# Patient Record
Sex: Female | Born: 1945
Health system: Southern US, Community
[De-identification: ages and names within clinical notes are randomized; demographics above are authoritative.]

## PROBLEM LIST (undated history)

## (undated) DIAGNOSIS — Z9889 Other specified postprocedural states: Secondary | ICD-10-CM

## (undated) DIAGNOSIS — M810 Age-related osteoporosis without current pathological fracture: Secondary | ICD-10-CM

## (undated) DIAGNOSIS — Z8719 Personal history of other diseases of the digestive system: Secondary | ICD-10-CM

## (undated) DIAGNOSIS — R0600 Dyspnea, unspecified: Secondary | ICD-10-CM

## (undated) DIAGNOSIS — S129XXA Fracture of neck, unspecified, initial encounter: Secondary | ICD-10-CM

## (undated) DIAGNOSIS — M8580 Other specified disorders of bone density and structure, unspecified site: Secondary | ICD-10-CM

## (undated) DIAGNOSIS — S83206A Unspecified tear of unspecified meniscus, current injury, right knee, initial encounter: Secondary | ICD-10-CM

## (undated) DIAGNOSIS — R112 Nausea with vomiting, unspecified: Secondary | ICD-10-CM

## (undated) DIAGNOSIS — Z87412 Personal history of vulvar dysplasia: Secondary | ICD-10-CM

## (undated) DIAGNOSIS — K219 Gastro-esophageal reflux disease without esophagitis: Secondary | ICD-10-CM

## (undated) DIAGNOSIS — R42 Dizziness and giddiness: Secondary | ICD-10-CM

## (undated) DIAGNOSIS — Z973 Presence of spectacles and contact lenses: Secondary | ICD-10-CM

## (undated) DIAGNOSIS — J8410 Pulmonary fibrosis, unspecified: Secondary | ICD-10-CM

## (undated) DIAGNOSIS — Z8781 Personal history of (healed) traumatic fracture: Secondary | ICD-10-CM

## (undated) DIAGNOSIS — Z86718 Personal history of other venous thrombosis and embolism: Secondary | ICD-10-CM

## (undated) DIAGNOSIS — R55 Syncope and collapse: Secondary | ICD-10-CM

## (undated) DIAGNOSIS — M25522 Pain in left elbow: Secondary | ICD-10-CM

## (undated) DIAGNOSIS — M1990 Unspecified osteoarthritis, unspecified site: Secondary | ICD-10-CM

## (undated) HISTORY — DX: Other specified disorders of bone density and structure, unspecified site: M85.80

## (undated) HISTORY — PX: APPENDECTOMY: SHX54

## (undated) HISTORY — DX: Other specified postprocedural states: R11.2

## (undated) HISTORY — PX: CARDIOVASCULAR STRESS TEST: SHX262

## (undated) HISTORY — PX: EYE SURGERY: SHX253

## (undated) HISTORY — PX: HERNIA REPAIR: SHX51

## (undated) HISTORY — DX: Other specified postprocedural states: Z98.890

## (undated) HISTORY — DX: Unspecified osteoarthritis, unspecified site: M19.90

## (undated) HISTORY — DX: Presence of spectacles and contact lenses: Z97.3

## (undated) HISTORY — PX: OTHER SURGICAL HISTORY: SHX169

---

## 1968-06-19 HISTORY — PX: TONSILLECTOMY AND ADENOIDECTOMY: SUR1326

## 1971-04-06 HISTORY — PX: OTHER SURGICAL HISTORY: SHX169

## 1972-05-15 HISTORY — PX: CERVICAL CONE BIOPSY: SUR198

## 1982-12-22 HISTORY — PX: TUBAL LIGATION: SHX77

## 1993-06-19 HISTORY — PX: GYNECOLOGIC CRYOSURGERY: SHX857

## 1996-10-21 HISTORY — PX: ABDOMINAL HYSTERECTOMY: SHX81

## 1999-06-16 HISTORY — PX: OTHER SURGICAL HISTORY: SHX169

## 2002-02-24 HISTORY — PX: NEUROPLASTY / TRANSPOSITION ULNAR NERVE AT ELBOW: SUR895

## 2007-06-26 HISTORY — PX: OTHER SURGICAL HISTORY: SHX169

## 2008-04-22 HISTORY — PX: TRANSTHORACIC ECHOCARDIOGRAM: SHX275

## 2009-01-27 HISTORY — PX: UMBILICAL HERNIA REPAIR: SHX196

## 2009-01-27 HISTORY — PX: CHOLECYSTECTOMY: SHX55

## 2010-01-28 ENCOUNTER — Encounter (INDEPENDENT_AMBULATORY_CARE_PROVIDER_SITE_OTHER): Payer: Self-pay | Admitting: *Deleted

## 2010-02-01 DIAGNOSIS — M8580 Other specified disorders of bone density and structure, unspecified site: Secondary | ICD-10-CM | POA: Insufficient documentation

## 2010-07-04 ENCOUNTER — Encounter
Admission: RE | Admit: 2010-07-04 | Discharge: 2010-07-04 | Payer: Self-pay | Source: Home / Self Care | Attending: Internal Medicine | Admitting: Internal Medicine

## 2010-07-19 NOTE — Letter (Signed)
Summary: New Patient letter  [facility]  [address] [place], [place] [address]   Phone: [phone]  Fax: [phone]       01/28/2010 MRN: [id number]  Sherry Barnes [address] [place], [place]  [address]  Dear Ms. Sherry Barnes,  Welcome to the Gastroenterology Division at [facility].    You are scheduled to see Dr.  Christella Hartigan on 03-07-10 at 3:30p.m. on the 3rd floor at [facility], [address].  We ask that you try to arrive at our office 15 minutes prior to your appointment time to allow for check-in.  We would like you to complete the enclosed self-administered evaluation form prior to your visit and bring it with you on the day of your appointment.  We will review it with you.  Also, please bring a complete list of all your medications or, if you prefer, bring the medication bottles and we will list them.  Please bring your insurance card so that we may make a copy of it.  If your insurance requires a referral to see a specialist, please bring your referral form from your primary care physician.  Co-payments are due at the time of your visit and may be paid by cash, check or credit card.     Your office visit will consist of a consult with your physician (includes a physical exam), any laboratory testing he/she may order, scheduling of any necessary diagnostic testing (e.g. x-ray, ultrasound, CT-scan), and scheduling of a procedure (e.g. Endoscopy, Colonoscopy) if required.  Please allow enough time on your schedule to allow for any/all of these possibilities.    If you cannot keep your appointment, please call [phone] to cancel or reschedule prior to your appointment date.  This allows [place] the opportunity to schedule an appointment for another patient in need of care.  If you do not cancel or reschedule by 5 p.m. the business day prior to your appointment date, you will be charged a $50.00 late cancellation/no-show fee.    Thank you for choosing  [facility] for your medical needs.  We appreciate the opportunity to care for you.  Please visit [place] at our website  to learn more about our practice.                     Sincerely,                                                             The Gastroenterology Division

## 2011-03-15 ENCOUNTER — Encounter (INDEPENDENT_AMBULATORY_CARE_PROVIDER_SITE_OTHER): Payer: Self-pay | Admitting: General Surgery

## 2011-03-16 ENCOUNTER — Other Ambulatory Visit (INDEPENDENT_AMBULATORY_CARE_PROVIDER_SITE_OTHER): Payer: Self-pay | Admitting: General Surgery

## 2011-03-16 ENCOUNTER — Encounter (INDEPENDENT_AMBULATORY_CARE_PROVIDER_SITE_OTHER): Payer: Self-pay | Admitting: General Surgery

## 2011-03-16 ENCOUNTER — Ambulatory Visit (INDEPENDENT_AMBULATORY_CARE_PROVIDER_SITE_OTHER): Payer: PRIVATE HEALTH INSURANCE | Admitting: General Surgery

## 2011-03-16 VITALS — BP 122/84 | HR 72 | Temp 97.1°F | Resp 16 | Ht 63.0 in | Wt 201.8 lb

## 2011-03-16 DIAGNOSIS — K439 Ventral hernia without obstruction or gangrene: Secondary | ICD-10-CM

## 2011-03-16 NOTE — Progress Notes (Addendum)
Chief Complaint  Patient presents with  . Other    Eval ventral hernia with pain    HPI Sherry Barnes is a 65 y.o. female.    This is a 65 year old Caucasian female, referred to me Dr. Merri Brunette for evaluation of a ventral hernia.  The patient has had several operations in the past. At age 61 she had removal of bilateral  ovarian dermoids and appendectomy through a Pfannenstiel incision. In 1984 she underwent laparoscopic bilateral tubal ligation. In 1998 she underwent hysterectomy and bladder repair. This may have been done vaginally or it may have been done through the Pfannenstiel incision.  On January 27, 2009 she underwent laparoscopic cholecystectomy and a repaired umbilical hernia with primary Prolene sutures, no mesh. Done in [place] , [place]. She moved to [place]  one year ago. She states that for one year she's had a painful bulge in her mid lower abdomen. She sometimes used gurgling in the area. It is painful when she tries to do lifting. She has not had any obstruction or incarceration.  She reported this to Dr. Carolee Rota office and they have referred her to me. She has not had any imaging studies or further evaluation.  Her only significant medical problems are obesity, history of DVT is currently not on Coumadin, possible sleep apnea, recent urinary tract infection, surgeries as described above.HPI  Past Medical History  Diagnosis Date  . Abdominal pain   . History of TIAs   . Osteoarthritis   . DVT of leg (deep venous thrombosis)     left  . History of UTI   . History of syncope     as a child only  . Osteopenia   . Lichen simplex     on scalp  . Hyperlipidemia     slight  .    Marland Kitchen Rectal bleeding     seldom  . Incontinence   . History of blood clots   . Wears glasses   . Cancer     vulvar carcinoma in situ, resulting in vulvar excision    Past Surgical History  Procedure Date  . Tonsilectomy, s 56  . Appendectomy   . Bilateral ovarian dermoid  cystectomy 04/06/1971  . Bladder repair     due to incontinence  . Condylectomy 06/16/1999    left foot, fifth toe  . Gynecologic cryosurgery 1995    for cervical dysplasia  . Transposition of ulnar nerve 02/24/2002    left elbow  .        Marland Kitchen Cervical cone biopsy 05/15/1972  . Tubal ligation 12/22/1982    bilateral  . Abdominal hysterectomy 10/21/1996  . Colposcopy vulva 06/26/2007    wide local excision of left vulva  . Hernia repair 01/27/2009    ventral umbilical   . Cholecystectomy 01/27/09    Family History  Problem Relation Age of Onset  . Irritable bowel syndrome Mother CAD (Mother)   . Other Mother     bladder infections, gallstones, low thyroid  . Heart disease Father     congestive heart failure -  . Emphysema Father   .     Marland Kitchen Other Sister     gallstones  . Other Brother     reflux, mitral valve prolapse    Social History History   Social History  . Marital Status: Married    Spouse Name: N/A    Number of Children: N/A  . Years of Education: N/A   Occupational History  . Not  on file.   Social History Main Topics  . Smoking status: Former Smoker    Quit date: 02/17/1974  . Smokeless tobacco: Never Used  . Alcohol Use: No  . Drug Use: No  . Sexually Active: Not on file   Other Topics Concern  . Not on file   Social History Narrative  . No narrative on file     No Known Allergies  Current Outpatient Prescriptions  Medication Sig Dispense Refill  . vitamin B-12 (CYANOCOBALAMIN) 100 MCG tablet Take 50 mcg by mouth daily.        . ARTIFICIAL TEAR OP Apply to eye.        . cholecalciferol (VITAMIN D) 1000 UNITS tablet Take 2,000 Units by mouth daily.       .          Review of Systems Review of Systems 12 system review of systems is performed and is negative except as described above. Blood pressure 122/84, pulse 72, temperature 97.1 F (36.2 C), temperature source Temporal, resp. rate 16, height 5\' 3"  (1.6 m), weight 201 lb 12.8 oz (91.536  kg).  Physical Exam Physical Exam  Constitutional: She is oriented to person, place, and time. She appears well-developed and well-nourished. No distress.  HENT:  Head: Normocephalic.  Nose: Nose normal.  Mouth/Throat: No oropharyngeal exudate.  Eyes: Conjunctivae and EOM are normal. Right eye exhibits no discharge. Left eye exhibits no discharge. No scleral icterus.  Neck: Normal range of motion. Neck supple. No JVD present. No tracheal deviation present. No thyromegaly present.  Cardiovascular: Normal rate, regular rhythm, normal heart sounds and intact distal pulses.   No murmur heard. Pulmonary/Chest: Effort normal and breath sounds normal. No respiratory distress. She has no wheezes. She has no rales. She exhibits no tenderness.  Abdominal: Soft. Bowel sounds are normal. She exhibits no distension and no mass. There is tenderness. There is no rebound and no guarding.    Musculoskeletal: Normal range of motion. She exhibits no edema and no tenderness.  Lymphadenopathy:    She has no cervical adenopathy.  Neurological: She is alert and oriented to person, place, and time. She exhibits normal muscle tone. Coordination normal.  Skin: Skin is warm and dry. No rash noted. She is not diaphoretic. No erythema. No pallor.  Psychiatric: She has a normal mood and affect. Her behavior is normal. Judgment and thought content normal.    Data Reviewed I have reviewed Dr. Carolee Rota office records, the operative note from [facility] 2010, and her list of surgeries.  Assessment    Suspect ventral incisional hernia, mid and lower abdomen, possibly incarcerated.status post laparoscopic cholecystectomy and primary or periumbilical hernia 2010.  Status post laparoscopic tubal ligation.  Status post hysterectomy and appendectomy.  History deep venous thrombosis left leg, now off Coumadin.  To be evaluated for sleep apnea.    Plan    Scheduled for CT scan of abdomen and  pelvis.  Return to see me in 2-3 weeks. If the CT scan confirms a ventral hernia then we will discuss options for surgical intervention.    ADDENDUM: (04/03/2011)  CT shows ventral hernia containing small bowel, but no obstruction.  I called patient and discussed findings with her and told her that she would benefit from repair with mesh. She is doing fine at home. She will see me in office Oct. 29.   Edker Punt M 03/16/2011, 12:08 PM

## 2011-03-16 NOTE — Patient Instructions (Signed)
I suspect that you have a ventral hernia in your mid and lower abdomen. You will be scheduled for a CT scan to better define the extent of the muscle defect. You  will return to see me in 3 weeks after this is done for further discussion and possible planning for surgery.

## 2011-03-21 ENCOUNTER — Ambulatory Visit
Admission: RE | Admit: 2011-03-21 | Discharge: 2011-03-21 | Disposition: A | Discharge status: Home / Self Care | Payer: Managed Care, Other (non HMO) | Source: Ambulatory Visit | Attending: General Surgery | Admitting: General Surgery

## 2011-03-21 DIAGNOSIS — K439 Ventral hernia without obstruction or gangrene: Secondary | ICD-10-CM

## 2011-03-21 MED ORDER — IOHEXOL 300 MG/ML  SOLN
125.0000 mL | Freq: Once | INTRAMUSCULAR | Status: AC | PRN
  Administered 2011-03-21: 125 mL via INTRAVENOUS

## 2011-04-17 ENCOUNTER — Ambulatory Visit (INDEPENDENT_AMBULATORY_CARE_PROVIDER_SITE_OTHER): Payer: Managed Care, Other (non HMO) | Admitting: General Surgery

## 2011-04-17 ENCOUNTER — Encounter (INDEPENDENT_AMBULATORY_CARE_PROVIDER_SITE_OTHER): Payer: Self-pay | Admitting: General Surgery

## 2011-04-17 DIAGNOSIS — K436 Other and unspecified ventral hernia with obstruction, without gangrene: Secondary | ICD-10-CM | POA: Insufficient documentation

## 2011-04-17 NOTE — Patient Instructions (Signed)
You will be scheduled for surgery. The surgery will repair your ventral hernia with mesh. We might be able to do all or part of this operation laparoscopically, but it is more likely that we'll have to do this through an open incision because of the size of the defect.

## 2011-04-17 NOTE — Progress Notes (Signed)
Chief Complaint  Patient presents with  . Routine Post Op    F/u abd CT discuss hernia sx    HPI Sherry Barnes is a 65 y.o. female.   HPI  This pleasant woman returns to discuss management of her ventral incisional hernia following her CT scan.  The CT scan shows a defect approximately 6.5 cm in diameter containing loops of small bowel. There is no obstruction. There are no other pathologic findings.  She brought anoperative dictation from the pelvic surgery that she had in 1998 in [facility]. She had a total abdominal hysterectomy, bilateral salpingo-oophorectomy, bladder suspension. It appears that they opened her bladder, removed some sutures, and performed a suprapubic cystostomy. A cul-de-sac plication was done.  I discussed the CT scan findings with her in detail. Her husband was with her throughout the encounter today.  Past Medical History  Diagnosis Date  . Abdominal pain   . History of TIAs   . Osteoarthritis   . DVT of leg (deep venous thrombosis)     left  . History of UTI   . History of syncope     as a child only  . Osteopenia   . Lichen simplex     on scalp  . Hyperlipidemia     slight  . Hiatal hernia   . Rectal bleeding     seldom  . Incontinence   . History of blood clots   . Wears glasses   . Cancer     vulvar carcinoma in situ, resulting in vulvar excision  . Ventral hernia     Past Surgical History  Procedure Date  . Tonsilectomy, adenoidectomy, bilateral myringotomy and tubes 1970  . Appendectomy   . Bilateral ovarian dermoid cystectomy 04/06/1971  . Bladder repair     due to incontinence  . Condylectomy 06/16/1999    left foot, fifth toe  . Gynecologic cryosurgery 1995    for cervical dysplasia  . Transposition of ulnar nerve 02/24/2002    left elbow  . Carpel tunnel     left  . Cervical cone biopsy 05/15/1972  . Tubal ligation 12/22/1982    bilateral  . Abdominal hysterectomy 10/21/1996  . Colposcopy vulva 06/26/2007    wide local excision of left vulva  . Hernia repair 01/27/2009    ventral umbilical   . Cholecystectomy 01/27/09    Family History  Problem Relation Age of Onset  . Irritable bowel syndrome Mother   . Other Mother     bladder infections, gallstones, low thyroid  . Heart disease Father     congestive heart failure - 3 blocked arteries  . Emphysema Father   . Parkinsonism Father   . Other Sister     gallstones  . Other Brother     reflux, mitral valve prolapse    Social History History  Substance Use Topics  . Smoking status: Former Smoker    Quit date: 03/15/1973  . Smokeless tobacco: Never Used  . Alcohol Use: No    No Known Allergies  Current Outpatient Prescriptions  Medication Sig Dispense Refill  . Calcium Carbonate-Vitamin D (CALCIUM + D PO) Take by mouth daily.        . cholecalciferol (VITAMIN D) 1000 UNITS tablet Take 2,000 Units by mouth daily.       . vitamin B-12 (CYANOCOBALAMIN) 100 MCG tablet Take 50 mcg by mouth daily.          Review of Systems Review of Systems  Constitutional: Negative for fever, chills and unexpected weight change.  HENT: Negative for hearing loss, congestion, sore throat, trouble swallowing and voice change.   Eyes: Negative for visual disturbance.  Respiratory: Negative for cough and wheezing.   Cardiovascular: Negative for chest pain, palpitations and leg swelling.  Gastrointestinal: Positive for abdominal pain and abdominal distention. Negative for nausea, vomiting, diarrhea, constipation, blood in stool and anal bleeding.  Genitourinary: Negative for hematuria, vaginal bleeding and difficulty urinating.  Musculoskeletal: Negative for arthralgias.  Skin: Negative for rash and wound.  Neurological: Negative for seizures, syncope and headaches.  Hematological: Negative for adenopathy. Does not bruise/bleed easily.  Psychiatric/Behavioral: Negative for confusion.    Blood pressure 125/82, pulse 64, temperature 97.9 F (36.6 C),  temperature source Temporal, resp. rate 20, height 5\' 3"  (1.6 m), weight 199 lb 8 oz (90.493 kg).  Physical Exam Physical Exam  Constitutional: She is oriented to person, place, and time. She appears well-developed and well-nourished. No distress.  Cardiovascular: Regular rhythm, normal heart sounds and intact distal pulses.   No murmur heard. Pulmonary/Chest: Effort normal and breath sounds normal. No respiratory distress. She has no wheezes. She has no rales. She exhibits no tenderness.  Abdominal: Soft. Bowel sounds are normal. She exhibits mass. She exhibits no distension. There is no tenderness. There is no rebound and no guarding.    Neurological: She is alert and oriented to person, place, and time. She exhibits normal muscle tone. Coordination normal.  Skin: Skin is warm and dry. No rash noted. She is not diaphoretic. No erythema. No pallor.  Psychiatric: She has a normal mood and affect. Her behavior is normal. Judgment and thought content normal.    Data Reviewed  I reviewed the CT CT scan report and a CT scan films. I reviewed the operative note from 1998.  Assessment    Incarcerated ventral incisional hernia. This might reduce under general anesthesia but it might not. The defect aof6.5 cm may be too large to consider laparoscopic bridging, and we may find that she would be better off with a primary repair and component separation technique.  Obesity. This will increase the risk of wound complications and recurrence.    Plan    Schedule for repair of her ventral incisional hernia which is partially incarcerated. We will set this up to start out as a laparoscopic approach. We may find that we simply can get the adhesions down laparoscopically and then have to convert to an open operation to do a proper repair. I told her and her husband that bridging a large defect is associated with unacceptable recurrence rates, but that open repair is associated with more pain and slightly  more wound complications and increased length of stay in the hospital. Avoiding any recurrences most important in this setting, however.  I discussed the indications and details of surgery with them both. Risks and complications have been outlined, including but limited to bleeding, infection, and recurrence of the hernia, injury to adjacent organs as the bladder or intestine with major reconstructive surgery, wound healing problems, cardiac, pulmonary and thromboembolic problems. She understands the issues well.  her questions were answered. She is in full  Agreement with this plan.       Shantia Sanford M 04/17/2011, 1:01 PM

## 2011-04-24 ENCOUNTER — Encounter (HOSPITAL_COMMUNITY): Payer: Self-pay | Admitting: Pharmacy Technician

## 2011-04-26 ENCOUNTER — Encounter (HOSPITAL_COMMUNITY): Payer: Self-pay

## 2011-04-26 ENCOUNTER — Ambulatory Visit (HOSPITAL_COMMUNITY)
Admission: RE | Admit: 2011-04-26 | Discharge: 2011-04-26 | Disposition: A | Discharge status: Home / Self Care | Payer: Managed Care, Other (non HMO) | Source: Ambulatory Visit | Attending: General Surgery | Admitting: General Surgery

## 2011-04-26 ENCOUNTER — Encounter (HOSPITAL_COMMUNITY): Payer: Managed Care, Other (non HMO)

## 2011-04-26 DIAGNOSIS — K439 Ventral hernia without obstruction or gangrene: Secondary | ICD-10-CM | POA: Insufficient documentation

## 2011-04-26 DIAGNOSIS — Z01818 Encounter for other preprocedural examination: Secondary | ICD-10-CM | POA: Insufficient documentation

## 2011-04-26 LAB — URINALYSIS, ROUTINE W REFLEX MICROSCOPIC
Bilirubin Urine: NEGATIVE
Glucose, UA: NEGATIVE mg/dL
Hgb urine dipstick: NEGATIVE
Ketones, ur: NEGATIVE mg/dL
Leukocytes, UA: NEGATIVE
Nitrite: NEGATIVE
Protein, ur: NEGATIVE mg/dL
Specific Gravity, Urine: 1.006 (ref 1.005–1.030)
Urobilinogen, UA: 0.2 mg/dL (ref 0.0–1.0)
pH: 5.5 (ref 5.0–8.0)

## 2011-04-26 LAB — DIFFERENTIAL
Basophils Absolute: 0 10*3/uL (ref 0.0–0.1)
Basophils Relative: 0 % (ref 0–1)
Eosinophils Absolute: 0.1 10*3/uL (ref 0.0–0.7)
Eosinophils Relative: 2 % (ref 0–5)
Lymphocytes Relative: 29 % (ref 12–46)
Lymphs Abs: 2.2 10*3/uL (ref 0.7–4.0)
Monocytes Absolute: 0.4 10*3/uL (ref 0.1–1.0)
Monocytes Relative: 5 % (ref 3–12)
Neutro Abs: 4.8 10*3/uL (ref 1.7–7.7)
Neutrophils Relative %: 64 % (ref 43–77)

## 2011-04-26 LAB — CBC
HCT: 37.9 % (ref 36.0–46.0)
Hemoglobin: 12.8 g/dL (ref 12.0–15.0)
MCH: 29.5 pg (ref 26.0–34.0)
MCHC: 33.8 g/dL (ref 30.0–36.0)
MCV: 87.3 fL (ref 78.0–100.0)
Platelets: 296 10*3/uL (ref 150–400)
RBC: 4.34 MIL/uL (ref 3.87–5.11)
RDW: 13.1 % (ref 11.5–15.5)
WBC: 7.5 10*3/uL (ref 4.0–10.5)

## 2011-04-26 LAB — COMPREHENSIVE METABOLIC PANEL
ALT: 17 U/L (ref 0–35)
AST: 18 U/L (ref 0–37)
Albumin: 3.7 g/dL (ref 3.5–5.2)
Alkaline Phosphatase: 90 U/L (ref 39–117)
BUN: 9 mg/dL (ref 6–23)
CO2: 27 mEq/L (ref 19–32)
Calcium: 9.9 mg/dL (ref 8.4–10.5)
Chloride: 104 mEq/L (ref 96–112)
Creatinine, Ser: 0.57 mg/dL (ref 0.50–1.10)
GFR calc Af Amer: >90 mL/min (ref >90)
GFR calc non Af Amer: >90 mL/min (ref >90)
Glucose, Bld: 93 mg/dL (ref 70–99)
Potassium: 4.1 mEq/L (ref 3.5–5.1)
Sodium: 139 mEq/L (ref 135–145)
Total Bilirubin: 0.2 mg/dL — ABNORMAL LOW (ref 0.3–1.2)
Total Protein: 7 g/dL (ref 6.0–8.3)

## 2011-04-26 NOTE — Pre-Procedure Instructions (Signed)
EKG REPORT 2012  AND  CARDIOLOGY OFFICE NOTE 12/08/10,   NUCLEAR STRESS TEST REPORT 08/26/10   --ON THIS CHART  FROM DR. HARDING [facility]. ECHOCARDIOGRAM REPORT 2009 ON CHART FROM [facility] [place]

## 2011-04-27 ENCOUNTER — Encounter (HOSPITAL_COMMUNITY): Payer: Self-pay

## 2011-05-01 ENCOUNTER — Other Ambulatory Visit (INDEPENDENT_AMBULATORY_CARE_PROVIDER_SITE_OTHER): Payer: Self-pay | Admitting: General Surgery

## 2011-05-01 MED ORDER — HEPARIN SODIUM (PORCINE) 5000 UNIT/ML IJ SOLN
5000.0000 [IU] | Freq: Once | INTRAMUSCULAR | Status: AC
  Administered 2011-05-02: 5000 [IU] via SUBCUTANEOUS

## 2011-05-01 NOTE — H&P (Signed)
Sherry Barnes   03/16/2011 11:00 AM Office Visit  MRN: [id number]   Description: 65 year old female  Provider: Ernestene Mention, MD  Department: [facility]        Diagnoses     Ventral hernia   - Primary    553.20      Reason for Visit     Other    Eval ventral hernia with pain       Reason For Visit History Recorded        Vitals - Last Recorded       BP Pulse Temp(Src) Resp Ht Wt    122/84  72  97.1 F (36.2 C) (Temporal)  16  5\' 3"  (1.6 m)  201 lb 12.8 oz (91.536 kg)          BMI              35.75 kg/m2                 Progress Notes     Sherry Mention, MD  04/03/2011  5:10 PM  AddendumChief Complaint   Patient presents with   .  Other       Eval ventral hernia with pain      HPI Sherry Barnes is a 65 y.o. female.     This is a 65 year old Caucasian female, referred to me Sherry Barnes for evaluation of a ventral hernia.   The patient has had several operations in the past. At age 84 she had removal of bilateral  ovarian dermoids and appendectomy through a Pfannenstiel incision. In 1984 she underwent laparoscopic bilateral tubal ligation. In 1998 she underwent hysterectomy and bladder repair. This may have been done vaginally or it may have been done through the Pfannenstiel incision.  On January 27, 2009 she underwent laparoscopic cholecystectomy and a repaired umbilical hernia with primary Prolene sutures, no mesh. Done in [place] , [place]. She moved to [facility] for renal one year ago. She states that for one year she's had a painful bulge in her mid lower abdomen. She sometimes used gurgling in the area. It is painful when she tries to do thanks. She has not had any obstruction or incarceration.   She reported this to Sherry Barnes office and they have referred her to me. She has not had any imaging studies or further evaluation.   Her only significant medical problems are obesity, history of DVT is currently not on Coumadin,  possible sleep apnea, recent urinary tract infection, surgeries as described above.HPI    Past Medical History   Diagnosis  Date   .  Abdominal pain     .  History of TIAs     .  Osteoarthritis     .  DVT of leg (deep venous thrombosis)         left   .  History of UTI     .  History of syncope         as a child only   .  Osteopenia     .  Lichen simplex         on scalp   .  Hyperlipidemia         slight   .  Hiatal hernia     .  Rectal bleeding         seldom   .  Incontinence     .  History of blood clots     .  Wears glasses     .  Cancer         vulvar carcinoma in situ, resulting in vulvar excision       Past Surgical History   Procedure  Date   .  Tonsilectomy, adenoidectomy, bilateral myringotomy and tubes  1970   .  Appendectomy     .  Bilateral ovarian dermoid cystectomy  04/06/1971   .  Bladder repair         due to incontinence   .  Condylectomy  06/16/1999       left foot, fifth toe   .  Gynecologic cryosurgery  1995       for cervical dysplasia   .  Transposition of ulnar nerve  02/24/2002       left elbow   .  Carpel tunnel         left   .  Cervical cone biopsy  05/15/1972   .  Tubal ligation  12/22/1982       bilateral   .  Abdominal hysterectomy  10/21/1996   .  Colposcopy vulva  06/26/2007       wide local excision of left vulva   .  Hernia repair  01/27/2009       ventral umbilical    .  Cholecystectomy  01/27/09       Family History   Problem  Relation  Age of Onset   .  Irritable bowel syndrome  Mother     .  Other  Mother         bladder infections, gallstones, low thyroid   .  Heart disease  Father         congestive heart failure - 3 blocked arteries   .  Emphysema  Father     .  Parkinsonism  Father     .  Other  Sister         gallstones   .  Other  Brother         reflux, mitral valve prolapse      Social History History   Substance Use Topics   .  Smoking status:  Former Smoker       Quit date:  03/15/1973   .  Smokeless  tobacco:  Never Used   .  Alcohol Use:  No      No Known Allergies    Current Outpatient Prescriptions   Medication  Sig  Dispense  Refill   .  vitamin B-12 (CYANOCOBALAMIN) 100 MCG tablet  Take 50 mcg by mouth daily.           .  ARTIFICIAL TEAR OP  Apply to eye.           .  cholecalciferol (VITAMIN D) 1000 UNITS tablet  Take 2,000 Units by mouth daily.          Marland Kitchen  warfarin (COUMADIN) 7.5 MG tablet  Take 7.5 mg by mouth daily.              Review of Systems Review of Systems 12 system review of systems is performed and is negative except as described above. Blood pressure 122/84, pulse 72, temperature 97.1 F (36.2 C), temperature source Temporal, resp. rate 16, height 5\' 3"  (1.6 m), weight 201 lb 12.8 oz (91.536 kg).   Physical Exam Physical Exam  Constitutional: She is oriented to person, place, and time. She appears well-developed and well-nourished. No distress.  HENT:   Head: Normocephalic.  Nose: Nose normal.   Mouth/Throat: No oropharyngeal exudate.  Eyes: Conjunctivae and EOM are normal. Right eye exhibits no discharge. Left eye exhibits no discharge. No scleral icterus.  Neck: Normal range of motion. Neck supple. No JVD present. No tracheal deviation present. No thyromegaly present.  Cardiovascular: Normal rate, regular rhythm, normal heart sounds and intact distal pulses.    No murmur heard. Pulmonary/Chest: Effort normal and breath sounds normal. No respiratory distress. She has no wheezes. She has no rales. She exhibits no tenderness.  Abdominal: Soft. Bowel sounds are normal. She exhibits no distension and no mass. There is tenderness. There is no rebound and no guarding.    Musculoskeletal: Normal range of motion. She exhibits no edema and no tenderness.  Lymphadenopathy:    She has no cervical adenopathy.  Neurological: She is alert and oriented to person, place, and time. She exhibits normal muscle tone. Coordination normal.  Skin: Skin is warm and dry. No  rash noted. She is not diaphoretic. No erythema. No pallor.  Psychiatric: She has a normal mood and affect. Her behavior is normal. Judgment and thought content normal.    Data Reviewed I have reviewed Sherry Barnes office records, the operative note from [facility] 2010, and her list of surgeries.   Assessment Suspect ventral incisional hernia, mid and lower abdomen, possibly incarcerated.status post laparoscopic cholecystectomy and primary or periumbilical hernia 2010.   Status post laparoscopic tubal ligation.   Status post hysterectomy and appendectomy.   History deep venous thrombosis left leg, now off Coumadin.   To be evaluated for sleep apnea.   Plan Scheduled for CT scan of abdomen and pelvis.   Return to see me in 2-3 weeks. If the CT scan confirms a ventral hernia then we will discuss options for surgical intervention. ADDENDUM: (04/03/2011)  CT shows ventral hernia containing small bowel, but no obstruction.  I called patient and discussed findings with her and told her that she would benefit from repair with mesh. She is doing fine at home. She will see me in office Oct. 29.     Treasa Bradshaw M 03/16/2011, 12:08 PM         Previous Version          Not recorded         Orders Placed This Encounter       Future Orders    CT Abdomen Pelvis W Contrast [[id number] Custom]    Expires: 06/14/12         Patient Instructions     I suspect that you have a ventral hernia in your mid and lower abdomen. You will be scheduled for a CT scan to better define the extent of the muscle defect. You  will return to see me in 3 weeks after this is done for further discussion and possible planning for surgery.       Level of Service     PR OFFICE CONSULTATION,LEVEL IV [id number]      Follow-up and Disposition     Return in about 3 weeks (around 04/06/2011) for after CT scan.       Follow-up and Disposition History Recorded        All  Flowsheet Templates (all recorded)     Encounter Vitals Flowsheet    Custom Formula Data Flowsheet    Anthropometrics Flowsheet               Referring Provider          Londell Moh,  MD       All Charges for This Encounter       Code Description Service Date Service Provider Modifiers Quantity    [phone] PR OFFICE CONSULTATION,LEVEL IV 03/16/2011 Sherry Mention, MD   1        Other Encounter Related Information     Allergies & Medications         Problem List         History         Patient-Entered Questionnaires     No data filed

## 2011-05-02 ENCOUNTER — Encounter (HOSPITAL_COMMUNITY)
Admission: RE | Disposition: A | Discharge status: Home / Self Care | Payer: Self-pay | Source: Ambulatory Visit | Attending: General Surgery

## 2011-05-02 ENCOUNTER — Inpatient Hospital Stay (HOSPITAL_COMMUNITY): Payer: Managed Care, Other (non HMO) | Admitting: Registered Nurse

## 2011-05-02 ENCOUNTER — Other Ambulatory Visit (INDEPENDENT_AMBULATORY_CARE_PROVIDER_SITE_OTHER): Payer: Self-pay | Admitting: General Surgery

## 2011-05-02 ENCOUNTER — Encounter (HOSPITAL_COMMUNITY): Payer: Self-pay

## 2011-05-02 ENCOUNTER — Inpatient Hospital Stay (HOSPITAL_COMMUNITY)
Admission: RE | Admit: 2011-05-02 | Discharge: 2011-05-06 | DRG: 337 | Disposition: A | Discharge status: Home / Self Care | Payer: Managed Care, Other (non HMO) | Source: Ambulatory Visit | Attending: General Surgery | Admitting: General Surgery

## 2011-05-02 ENCOUNTER — Encounter (HOSPITAL_COMMUNITY): Payer: Self-pay | Admitting: Registered Nurse

## 2011-05-02 DIAGNOSIS — Z86718 Personal history of other venous thrombosis and embolism: Secondary | ICD-10-CM

## 2011-05-02 DIAGNOSIS — E785 Hyperlipidemia, unspecified: Secondary | ICD-10-CM | POA: Diagnosis present

## 2011-05-02 DIAGNOSIS — Z87440 Personal history of urinary (tract) infections: Secondary | ICD-10-CM

## 2011-05-02 DIAGNOSIS — Z5331 Laparoscopic surgical procedure converted to open procedure: Secondary | ICD-10-CM

## 2011-05-02 DIAGNOSIS — K436 Other and unspecified ventral hernia with obstruction, without gangrene: Secondary | ICD-10-CM

## 2011-05-02 DIAGNOSIS — K660 Peritoneal adhesions (postprocedural) (postinfection): Secondary | ICD-10-CM | POA: Diagnosis present

## 2011-05-02 DIAGNOSIS — K430 Incisional hernia with obstruction, without gangrene: Principal | ICD-10-CM | POA: Diagnosis present

## 2011-05-02 DIAGNOSIS — E669 Obesity, unspecified: Secondary | ICD-10-CM | POA: Diagnosis present

## 2011-05-02 HISTORY — PX: VENTRAL HERNIA REPAIR: SHX424

## 2011-05-02 LAB — CBC
Hemoglobin: 11.6 g/dL — ABNORMAL LOW (ref 12.0–15.0)
MCH: 29.1 pg (ref 26.0–34.0)
Platelets: 277 10*3/uL (ref 150–400)
RBC: 3.98 MIL/uL (ref 3.87–5.11)
WBC: 10.1 10*3/uL (ref 4.0–10.5)

## 2011-05-02 LAB — CREATININE, SERUM
Creatinine, Ser: 0.61 mg/dL (ref 0.50–1.10)
GFR calc Af Amer: >90 mL/min (ref >90)

## 2011-05-02 SURGERY — REPAIR, HERNIA, VENTRAL, LAPAROSCOPIC
Anesthesia: General | Site: Abdomen | Wound class: Clean

## 2011-05-02 MED ORDER — SODIUM CHLORIDE 0.9 % IR SOLN
Status: DC | PRN
  Administered 2011-05-02: 1000 mL

## 2011-05-02 MED ORDER — KCL IN DEXTROSE-NACL 20-5-0.45 MEQ/L-%-% IV SOLN
INTRAVENOUS | Status: DC
  Administered 2011-05-02 – 2011-05-04 (×4): via INTRAVENOUS
  Filled 2011-05-02 (×7): qty 1000

## 2011-05-02 MED ORDER — VITAMIN D3 25 MCG (1000 UNIT) PO TABS
2000.0000 [IU] | ORAL_TABLET | Freq: Every day | ORAL | Status: DC
  Administered 2011-05-03 – 2011-05-06 (×4): 2000 [IU] via ORAL
  Filled 2011-05-02 (×5): qty 2

## 2011-05-02 MED ORDER — TRIAMCINOLONE ACETONIDE 0.1 % EX CREA
1.0000 "application " | TOPICAL_CREAM | Freq: Two times a day (BID) | CUTANEOUS | Status: DC | PRN
  Filled 2011-05-02: qty 15

## 2011-05-02 MED ORDER — BUPIVACAINE-EPINEPHRINE 0.5% -1:200000 IJ SOLN
INTRAMUSCULAR | Status: DC | PRN
  Administered 2011-05-02: 15 mL

## 2011-05-02 MED ORDER — LIDOCAINE HCL (CARDIAC) 20 MG/ML IV SOLN
INTRAVENOUS | Status: DC | PRN
  Administered 2011-05-02: 100 mg via INTRAVENOUS

## 2011-05-02 MED ORDER — ROCURONIUM BROMIDE 100 MG/10ML IV SOLN
INTRAVENOUS | Status: DC | PRN
  Administered 2011-05-02 (×3): 10 mg via INTRAVENOUS
  Administered 2011-05-02: 50 mg via INTRAVENOUS

## 2011-05-02 MED ORDER — ONDANSETRON HCL 4 MG/2ML IJ SOLN
INTRAMUSCULAR | Status: DC | PRN
  Administered 2011-05-02: 4 mg via INTRAVENOUS

## 2011-05-02 MED ORDER — SCOPOLAMINE 1 MG/3DAYS TD PT72
MEDICATED_PATCH | TRANSDERMAL | Status: AC
  Filled 2011-05-02: qty 1

## 2011-05-02 MED ORDER — PROPOFOL 10 MG/ML IV EMUL
INTRAVENOUS | Status: DC | PRN
  Administered 2011-05-02: 200 mg via INTRAVENOUS

## 2011-05-02 MED ORDER — HEPARIN SODIUM (PORCINE) 5000 UNIT/ML IJ SOLN
5000.0000 [IU] | Freq: Three times a day (TID) | INTRAMUSCULAR | Status: DC
  Administered 2011-05-03 – 2011-05-06 (×9): 5000 [IU] via SUBCUTANEOUS
  Filled 2011-05-02 (×12): qty 1

## 2011-05-02 MED ORDER — CEFAZOLIN SODIUM 1-5 GM-% IV SOLN
INTRAVENOUS | Status: DC | PRN
  Administered 2011-05-02: 2 g via INTRAVENOUS

## 2011-05-02 MED ORDER — GLYCOPYRROLATE 0.2 MG/ML IJ SOLN
INTRAMUSCULAR | Status: DC | PRN
  Administered 2011-05-02: .4 mg via INTRAVENOUS

## 2011-05-02 MED ORDER — BUPIVACAINE LIPOSOME 1.3 % IJ SUSP
20.0000 mL | Freq: Once | INTRAMUSCULAR | Status: DC
  Filled 2011-05-02: qty 20

## 2011-05-02 MED ORDER — DEXAMETHASONE SODIUM PHOSPHATE 10 MG/ML IJ SOLN
INTRAMUSCULAR | Status: DC | PRN
  Administered 2011-05-02: 10 mg via INTRAVENOUS

## 2011-05-02 MED ORDER — CALCIUM CARBONATE-VITAMIN D 500-200 MG-UNIT PO TABS
1.0000 | ORAL_TABLET | Freq: Every day | ORAL | Status: DC
  Administered 2011-05-03: 11:00:00 via ORAL
  Administered 2011-05-04 – 2011-05-06 (×3): 1 via ORAL
  Filled 2011-05-02 (×5): qty 1

## 2011-05-02 MED ORDER — CEFAZOLIN SODIUM-DEXTROSE 2-3 GM-% IV SOLR: 2.0000 g | INTRAVENOUS | Status: DC

## 2011-05-02 MED ORDER — KETOROLAC TROMETHAMINE 15 MG/ML IJ SOLN
15.0000 mg | Freq: Four times a day (QID) | INTRAMUSCULAR | Status: AC
  Administered 2011-05-02: 15 mg via INTRAVENOUS

## 2011-05-02 MED ORDER — KETOROLAC TROMETHAMINE 15 MG/ML IJ SOLN
15.0000 mg | Freq: Four times a day (QID) | INTRAMUSCULAR | Status: DC | PRN
  Administered 2011-05-02 – 2011-05-04 (×6): 15 mg via INTRAVENOUS
  Filled 2011-05-02 (×6): qty 1

## 2011-05-02 MED ORDER — BUPIVACAINE-EPINEPHRINE (PF) 0.5% -1:200000 IJ SOLN
INTRAMUSCULAR | Status: AC
  Filled 2011-05-02: qty 10

## 2011-05-02 MED ORDER — NEOSTIGMINE METHYLSULFATE 1 MG/ML IJ SOLN
INTRAMUSCULAR | Status: DC | PRN
  Administered 2011-05-02: 2.5 mg via INTRAVENOUS

## 2011-05-02 MED ORDER — BUPIVACAINE LIPOSOME 1.3 % IJ SUSP
INTRAMUSCULAR | Status: DC | PRN
  Administered 2011-05-02: 20 mL

## 2011-05-02 MED ORDER — SODIUM CHLORIDE 0.9 % IJ SOLN
INTRAMUSCULAR | Status: DC | PRN
  Administered 2011-05-02: 20 mL

## 2011-05-02 MED ORDER — ONDANSETRON HCL 4 MG/2ML IJ SOLN: 4.0000 mg | Freq: Four times a day (QID) | INTRAMUSCULAR | Status: DC | PRN

## 2011-05-02 MED ORDER — HETASTARCH-ELECTROLYTES 6 % IV SOLN
INTRAVENOUS | Status: DC | PRN
  Administered 2011-05-02: 08:00:00 via INTRAVENOUS

## 2011-05-02 MED ORDER — ACETAMINOPHEN 10 MG/ML IV SOLN
INTRAVENOUS | Status: DC | PRN
  Administered 2011-05-02: 1000 mg via INTRAVENOUS

## 2011-05-02 MED ORDER — CEFAZOLIN SODIUM-DEXTROSE 2-3 GM-% IV SOLR
2.0000 g | Freq: Three times a day (TID) | INTRAVENOUS | Status: AC
  Administered 2011-05-02 – 2011-05-03 (×3): 2 g via INTRAVENOUS
  Filled 2011-05-02 (×3): qty 50

## 2011-05-02 MED ORDER — HYDROMORPHONE HCL PF 1 MG/ML IJ SOLN
0.2500 mg | INTRAMUSCULAR | Status: DC | PRN
  Administered 2011-05-02: 0.5 mg via INTRAVENOUS

## 2011-05-02 MED ORDER — PHENYLEPHRINE HCL 10 MG/ML IJ SOLN
INTRAMUSCULAR | Status: DC | PRN
  Administered 2011-05-02 (×5): 80 ug via INTRAVENOUS

## 2011-05-02 MED ORDER — MIDAZOLAM HCL 5 MG/5ML IJ SOLN
INTRAMUSCULAR | Status: DC | PRN
  Administered 2011-05-02: 2 mg via INTRAVENOUS

## 2011-05-02 MED ORDER — HYDROMORPHONE HCL PF 1 MG/ML IJ SOLN
INTRAMUSCULAR | Status: AC
  Filled 2011-05-02: qty 1

## 2011-05-02 MED ORDER — CEFAZOLIN SODIUM 1-5 GM-% IV SOLN
INTRAVENOUS | Status: AC
  Filled 2011-05-02: qty 50

## 2011-05-02 MED ORDER — DROPERIDOL 2.5 MG/ML IJ SOLN
INTRAMUSCULAR | Status: DC | PRN
  Administered 2011-05-02: .625 mg via INTRAVENOUS

## 2011-05-02 MED ORDER — SCOPOLAMINE 1 MG/3DAYS TD PT72
MEDICATED_PATCH | TRANSDERMAL | Status: DC | PRN
  Administered 2011-05-02: 1.5 mg via TRANSDERMAL

## 2011-05-02 MED ORDER — KETOROLAC TROMETHAMINE 30 MG/ML IJ SOLN
INTRAMUSCULAR | Status: AC
  Filled 2011-05-02: qty 1

## 2011-05-02 MED ORDER — CYANOCOBALAMIN 500 MCG PO TABS
500.0000 ug | ORAL_TABLET | Freq: Every day | ORAL | Status: DC
  Administered 2011-05-03 – 2011-05-06 (×4): 500 ug via ORAL
  Filled 2011-05-02 (×5): qty 1

## 2011-05-02 MED ORDER — MORPHINE SULFATE 2 MG/ML IJ SOLN: 2.0000 mg | INTRAMUSCULAR | Status: DC | PRN

## 2011-05-02 MED ORDER — PROMETHAZINE HCL 25 MG/ML IJ SOLN: 6.2500 mg | INTRAMUSCULAR | Status: DC | PRN

## 2011-05-02 MED ORDER — LACTATED RINGERS IV SOLN
INTRAVENOUS | Status: DC | PRN
  Administered 2011-05-02 (×4): via INTRAVENOUS

## 2011-05-02 MED ORDER — ONDANSETRON HCL 4 MG PO TABS: 4.0000 mg | ORAL_TABLET | Freq: Four times a day (QID) | ORAL | Status: DC | PRN

## 2011-05-02 MED ORDER — SUFENTANIL CITRATE 50 MCG/ML IV SOLN
INTRAVENOUS | Status: DC | PRN
  Administered 2011-05-02: 15 ug via INTRAVENOUS
  Administered 2011-05-02: 5 ug via INTRAVENOUS
  Administered 2011-05-02: 10 ug via INTRAVENOUS
  Administered 2011-05-02 (×2): 5 ug via INTRAVENOUS
  Administered 2011-05-02: 10 ug via INTRAVENOUS

## 2011-05-02 MED ORDER — CEFAZOLIN SODIUM-DEXTROSE 2-3 GM-% IV SOLR
INTRAVENOUS | Status: AC
  Filled 2011-05-02: qty 50

## 2011-05-02 MED ORDER — ACETAMINOPHEN 10 MG/ML IV SOLN
INTRAVENOUS | Status: AC
  Filled 2011-05-02: qty 100

## 2011-05-02 SURGICAL SUPPLY — 57 items
APPLIER CLIP 5 13 M/L LIGAMAX5 (MISCELLANEOUS)
BENZOIN TINCTURE PRP APPL 2/3 (GAUZE/BANDAGES/DRESSINGS) IMPLANT
BINDER ABD UNIV 12 45-62 (WOUND CARE) ×1 IMPLANT
BINDER ABDOMINAL 46IN 62IN (WOUND CARE) ×2
CANISTER SUCTION 2500CC (MISCELLANEOUS) ×2 IMPLANT
CANNULA ENDOPATH XCEL 11M (ENDOMECHANICALS) IMPLANT
CATH FOLEY 3WAY  5CC 18FR (CATHETERS) ×1
CATH FOLEY 3WAY 5CC 18FR (CATHETERS) ×1 IMPLANT
CLIP APPLIE 5 13 M/L LIGAMAX5 (MISCELLANEOUS) IMPLANT
CLOTH BEACON ORANGE TIMEOUT ST (SAFETY) ×2 IMPLANT
DECANTER SPIKE VIAL GLASS SM (MISCELLANEOUS) ×2 IMPLANT
DEVICE SECURE STRAP 25 ABSORB (INSTRUMENTS) IMPLANT
DEVICE TROCAR PUNCTURE CLOSURE (ENDOMECHANICALS) ×2 IMPLANT
DISSECTOR BLUNT TIP ENDO 5MM (MISCELLANEOUS) IMPLANT
DRAIN CHANNEL RND F F (WOUND CARE) ×2 IMPLANT
DRAPE LAPAROSCOPIC ABDOMINAL (DRAPES) ×2 IMPLANT
DRSG PAD ABDOMINAL 8X10 ST (GAUZE/BANDAGES/DRESSINGS) ×2 IMPLANT
ELECT REM PT RETURN 9FT ADLT (ELECTROSURGICAL) ×2
ELECTRODE REM PT RTRN 9FT ADLT (ELECTROSURGICAL) ×1 IMPLANT
GLOVE BIOGEL PI IND STRL 7.0 (GLOVE) ×1 IMPLANT
GLOVE BIOGEL PI INDICATOR 7.0 (GLOVE) ×1
GLOVE EUDERMIC 7 POWDERFREE (GLOVE) ×2 IMPLANT
GOWN STRL NON-REIN LRG LVL3 (GOWN DISPOSABLE) ×2 IMPLANT
GOWN STRL REIN XL XLG (GOWN DISPOSABLE) ×4 IMPLANT
HAND ACTIVATED (MISCELLANEOUS) ×2 IMPLANT
KIT BASIN OR (CUSTOM PROCEDURE TRAY) ×2 IMPLANT
MESH PHYSIO OVAL 15X20CM (Mesh General) ×2 IMPLANT
NEEDLE SPNL 22GX3.5 QUINCKE BK (NEEDLE) ×2 IMPLANT
NS IRRIG 1000ML POUR BTL (IV SOLUTION) ×2 IMPLANT
PEN SKIN MARKING BROAD (MISCELLANEOUS) ×2 IMPLANT
PENCIL BUTTON HOLSTER BLD 10FT (ELECTRODE) ×2 IMPLANT
PLUG CATH AND CAP STER (CATHETERS) ×2 IMPLANT
POUCH SPECIMEN RETRIEVAL 10MM (ENDOMECHANICALS) IMPLANT
SCISSORS LAP 5X35 DISP (ENDOMECHANICALS) ×2 IMPLANT
SET IRRIG TUBING LAPAROSCOPIC (IRRIGATION / IRRIGATOR) ×2 IMPLANT
SOLUTION ANTI FOG 6CC (MISCELLANEOUS) ×2 IMPLANT
SPONGE GAUZE 4X4 12PLY (GAUZE/BANDAGES/DRESSINGS) ×2 IMPLANT
SPONGE LAP 18X18 X RAY DECT (DISPOSABLE) ×4 IMPLANT
STAPLER VISISTAT 35W (STAPLE) ×2 IMPLANT
STRIP CLOSURE SKIN 1/2X4 (GAUZE/BANDAGES/DRESSINGS) IMPLANT
SUT MNCRL AB 4-0 PS2 18 (SUTURE) ×2 IMPLANT
SUT NOVA 0 T19/GS 22DT (SUTURE) ×6 IMPLANT
SUT NOVA 1 T20/GS 25DT (SUTURE) ×2 IMPLANT
SUT NOVA NAB DX-16 0-1 5-0 T12 (SUTURE) ×4 IMPLANT
SUT NYLON 3 0 (SUTURE) ×2 IMPLANT
SUT VIC AB 0 UR5 27 (SUTURE) ×2 IMPLANT
SUT VIC AB 2-0 CT1 27 (SUTURE) ×1
SUT VIC AB 2-0 CT1 TAPERPNT 27 (SUTURE) ×1 IMPLANT
TACKER 5MM HERNIA 3.5CML NAB (ENDOMECHANICALS) ×2 IMPLANT
TOWEL OR 17X26 10 PK STRL BLUE (TOWEL DISPOSABLE) ×2 IMPLANT
TRAY FOLEY CATH 14FRSI W/METER (CATHETERS) ×2 IMPLANT
TRAY LAP CHOLE (CUSTOM PROCEDURE TRAY) ×2 IMPLANT
TROCAR BLADELESS OPT 5 75 (ENDOMECHANICALS) ×8 IMPLANT
TROCAR XCEL BLUNT TIP 100MML (ENDOMECHANICALS) IMPLANT
TROCAR XCEL NON-BLD 11X100MML (ENDOMECHANICALS) ×2 IMPLANT
TUBING INSUFFLATION 10FT LAP (TUBING) ×2 IMPLANT
YANKAUER SUCT BULB TIP 10FT TU (MISCELLANEOUS) ×2 IMPLANT

## 2011-05-02 NOTE — Preoperative (Signed)
Beta Blockers   Reason not to administer Beta Blockers:Not Applicable 

## 2011-05-02 NOTE — Anesthesia Postprocedure Evaluation (Signed)
  Anesthesia Post-op Note  Patient: Sherry Barnes  Procedure(s) Performed:  LAPAROSCOPIC VENTRAL HERNIA - attempted Laparoscopic ventraL hernia repair with mesh, open ventral hernia repair   Patient Location: PACU  Anesthesia Type: General  Level of Consciousness: oriented and sedated  Airway and Oxygen Therapy: Patient Spontanous Breathing and Patient connected to nasal cannula oxygen  Post-op Pain: mild  Post-op Assessment: Post-op Vital signs reviewed, Patient's Cardiovascular Status Stable, Respiratory Function Stable and Patent Airway  Post-op Vital Signs: stable  Complications: No apparent anesthesia complications

## 2011-05-02 NOTE — Transfer of Care (Signed)
Immediate Anesthesia Transfer of Care Note  Patient: Sherry Barnes  Procedure(s) Performed:  LAPAROSCOPIC VENTRAL HERNIA - attempted Laparoscopic ventraL hernia repair with mesh, open ventral hernia repair   Patient Location: PACU  Anesthesia Type: General  Level of Consciousness: awake, alert , oriented and patient cooperative  Airway & Oxygen Therapy: Patient Spontanous Breathing and Patient connected to face mask oxygen  Post-op Assessment: Report given to PACU RN and Post -op Vital signs reviewed and stable  Post vital signs: stable  Complications: No apparent anesthesia complications

## 2011-05-02 NOTE — Op Note (Signed)
Ventral Hernia Repair OP Note  Celesta Gentile  Preop diagnosis: incarcerated ventral incisional hernia  Postop diagnosis: incarcerated ventral incisional hernia  Operation performed: laparoscopic lysis of adhesions, open ventral hernia repair with inlay Physiomeshmesh  Surgeon:  Ernestene Mention  Operative indications:   This is a 65 year old Caucasian female who has had numerous operations in the past, including bilateral ovarian dermoid cystectomy, bladder repair for incontinence, abdominal hysterectomy, umbilical hernia repair with primary closure. She was evaluated recently for a one year history of a painful bulge in her mid lower abdomen and she sometimes hears gurgling in that area. I have evaluated her and found she has a ventral hernia in the midline. I cannot completely reduce this. CT scan shows approximately 6.5 cm defect containing loops of small bowel with a significantly larger hernia sac. There was no apparent pathologic process of the large or small bowel. She would like to have this repaired because of the pain and concern for progression over time. She's been advised that we will do at least part of the surgery laparoscopically, but because of the size of the sac and the defect that we may have to convert this to open procedure. She is brought to the operating room electively.  Operative technique:   Following the induction general endotracheal anesthesia, surgical, a surgical time out was held identifying the correct patien,t correct procedure. Intravenous antibiotics were given. A Foley catheter was inserted. The abdomen and upper thighs were prepped and draped in a sterile fashion.  A 5 mm optical trocar was placed in the left subcostal region. Entry was uneventful. Pneumoperitoneum was created.  Exploration revealed that there was a lot of omentum incarcerated in the ventral hernia sac and that the transverse colon was up to but not completely within the sac. We placed a  11 mm trocar in the left lateral abdomen and a 5 mm trocar in the left lower quadrant.  We slowly dissected the contents of the hernia sac out using a variety of  camera angles. We used blunt dissection, scissor dissection, cautery dissection and the harmonic scalpel. We eventually dissected all of the omentum out of the hernia sac and dropped the omentum and the colon down. We inspected the transverse colon and it looked fine. The small bowel was inspected it looked fine. There was no bleeding.  I explored the hernia sac with the laparoscopic instruments and found that the hernia sac was multichambered and probably was about 20-25 cm in size. I felt that laparoscopic repair with mesh would lead to a large fluid space and seroma formation and also was concerned  that the defect itself being at least 6 cm it would lead to a chronic bulge. I therefore elected to convert to an open procedure for the repair of the abdominal wall muscle. The pneumoperitoneum was released. The trocars were removed. A midline incision was made partially above and partially below the umbilicus. Dissection was carried down through subjacent tissue. The hernia sac was extensively debrided from both the right and the left side until the entire hernia sac was freed away. This was sent to pathology.  I identified the edges of the fascia and undermined the subcutaneous tissue circumferentially. I felt all around under the fascia and in the  abdomen felt no other defects. The fascial defect was 8 cm.  I brought a 15 x 20 cm piece of Physio mesh to the operative field. It was trimmed a little but in order to accommodate the wound.  The mesh was sutured in place as an inlay repair with interrupted mattress sutures of #1 Novofil. The sutures were placed individually and tied as we went. Once this was completely done I checked the repair. I had at least a 5 cm overlap on the inside of the fascia all the way around. There was no defect or gap in  the repair and I was satisfied with this. There was no bleeding. The wound was irrigated with saline. The fascia actually closed  transversely with several interrupted figure-of-eight sutures of #1 Novofil. A 19 [place] Blake drain was placed in subcutaneous tissue and brought through one of the left lateral trocar sites, sutured to the skin with nylon suture and connected to a suction bulb. The subcutaneous tissue was closed with interrupted sutures of 2-0 Vicryl and skin closed with skin staples.  Prior to closing the wound I injected Exparel local anesthetic into the muscle layers and the subcutaneous tissues. A mix 20 cc of exparel was mixed with 20 cc of saline and injected all 40 cc into the tissues.  After closing the skin dry bandages were placed. EBL was about 50 cc. complications none. Counts were correct. The patient was taken to recovery in stable condition.     Wilfrid Hyser M 05/02/2011 10:03 AM

## 2011-05-02 NOTE — Anesthesia Preprocedure Evaluation (Addendum)
Anesthesia Evaluation  Patient identified by MRN, date of birth, ID band Patient awake    Reviewed: Allergy & Precautions, H&P , NPO status , Patient's Chart, lab work & pertinent test results, reviewed documented beta blocker date and time   History of Anesthesia Complications (+) PONV  Airway  TM Distance: >3 FB Neck ROM: Full    Dental  (+) Caps   Pulmonary neg pulmonary ROS,  clear to auscultation        Cardiovascular neg cardio ROS Regular Normal Denies cardiac symptoms   Neuro/Psych Negative Neurological ROS  Negative Psych ROS   GI/Hepatic negative GI ROS, Neg liver ROS,   Endo/Other  Negative Endocrine ROS  Renal/GU negative Renal ROS  Genitourinary negative   Musculoskeletal negative musculoskeletal ROS (+)   Abdominal   Peds negative pediatric ROS (+)  Hematology negative hematology ROS (+)   Anesthesia Other Findings Caps in back  Reproductive/Obstetrics negative OB ROS                          Anesthesia Physical Anesthesia Plan  ASA: II  Anesthesia Plan: General   Post-op Pain Management:    Induction: Intravenous  Airway Management Planned: Oral ETT  Additional Equipment:   Intra-op Plan:   Post-operative Plan: Extubation in OR  Informed Consent: I have reviewed the patients History and Physical, chart, labs and discussed the procedure including the risks, benefits and alternatives for the proposed anesthesia with the patient or authorized representative who has indicated his/her understanding and acceptance.     Plan Discussed with: CRNA and Surgeon  Anesthesia Plan Comments:        Anesthesia Quick Evaluation

## 2011-05-02 NOTE — H&P (View-Only) (Signed)
 Sherry Barnes   03/16/2011 11:00 AM Office Visit  MRN: [id number]   Description: 65 year old female  Provider: Davied Nocito M, MD  Department: [facility]        Diagnoses     Ventral hernia   - Primary    553.20      Reason for Visit     Other    Eval ventral hernia with pain       Reason For Visit History Recorded        Vitals - Last Recorded       BP Pulse Temp(Src) Resp Ht Wt    122/84  72  97.1 F (36.2 C) (Temporal)  16  5' 3" (1.6 m)  201 lb 12.8 oz (91.536 kg)          BMI              35.75 kg/m2                 Progress Notes     Alyrica Thurow M, MD  04/03/2011  5:10 PM  AddendumChief Complaint   Patient presents with   .  Other       Eval ventral hernia with pain      HPI Sherry Barnes is a 65 y.o. female.     This is a 65-year-old Caucasian female, referred to me Dr. Walter Pharr for evaluation of a ventral hernia.   The patient has had several operations in the past. At age 25 she had removal of bilateral  ovarian dermoids and appendectomy through a Pfannenstiel incision. In 1984 she underwent laparoscopic bilateral tubal ligation. In 1998 she underwent hysterectomy and bladder repair. This may have been done vaginally or it may have been done through the Pfannenstiel incision.  On January 27, 2009 she underwent laparoscopic cholecystectomy and a repaired umbilical hernia with primary Prolene sutures, no mesh. Done in [place]. She moved to [place] for renal one year ago. She states that for one year she's had a painful bulge in her mid lower abdomen. She sometimes used gurgling in the area. It is painful when she tries to do thanks. She has not had any obstruction or incarceration.   She reported this to Dr. Pharr's office and they have referred her to me. She has not had any imaging studies or further evaluation.   Her only significant medical problems are obesity, history of DVT is currently not on Coumadin,  possible sleep apnea, recent urinary tract infection, surgeries as described above.HPI    Past Medical History   Diagnosis  Date   .  Abdominal pain     .  History of TIAs     .  Osteoarthritis     .  DVT of leg (deep venous thrombosis)         left   .  History of UTI     .  History of syncope         as a child only   .  Osteopenia     .  Lichen simplex         on scalp   .  Hyperlipidemia         slight   .  Hiatal hernia     .  Rectal bleeding         seldom   .  Incontinence     .  History of blood clots     .    Wears glasses     .  Cancer         vulvar carcinoma in situ, resulting in vulvar excision       Past Surgical History   Procedure  Date   .  Tonsilectomy, adenoidectomy, bilateral myringotomy and tubes  1970   .  Appendectomy     .  Bilateral ovarian dermoid cystectomy  04/06/1971   .  Bladder repair         due to incontinence   .  Condylectomy  06/16/1999       left foot, fifth toe   .  Gynecologic cryosurgery  1995       for cervical dysplasia   .  Transposition of ulnar nerve  02/24/2002       left elbow   .  Carpel tunnel         left   .  Cervical cone biopsy  05/15/1972   .  Tubal ligation  12/22/1982       bilateral   .  Abdominal hysterectomy  10/21/1996   .  Colposcopy vulva  06/26/2007       wide local excision of left vulva   .  Hernia repair  01/27/2009       ventral umbilical    .  Cholecystectomy  01/27/09       Family History   Problem  Relation  Age of Onset   .  Irritable bowel syndrome  Mother     .  Other  Mother         bladder infections, gallstones, low thyroid   .  Heart disease  Father         congestive heart failure - 3 blocked arteries   .  Emphysema  Father     .  Parkinsonism  Father     .  Other  Sister         gallstones   .  Other  Brother         reflux, mitral valve prolapse      Social History History   Substance Use Topics   .  Smoking status:  Former Smoker       Quit date:  03/15/1973   .  Smokeless  tobacco:  Never Used   .  Alcohol Use:  No      No Known Allergies    Current Outpatient Prescriptions   Medication  Sig  Dispense  Refill   .  vitamin B-12 (CYANOCOBALAMIN) 100 MCG tablet  Take 50 mcg by mouth daily.           .  ARTIFICIAL TEAR OP  Apply to eye.           .  cholecalciferol (VITAMIN D) 1000 UNITS tablet  Take 2,000 Units by mouth daily.          .  warfarin (COUMADIN) 7.5 MG tablet  Take 7.5 mg by mouth daily.              Review of Systems Review of Systems 12 system review of systems is performed and is negative except as described above. Blood pressure 122/84, pulse 72, temperature 97.1 F (36.2 C), temperature source Temporal, resp. rate 16, height 5' 3" (1.6 m), weight 201 lb 12.8 oz (91.536 kg).   Physical Exam Physical Exam  Constitutional: She is oriented to person, place, and time. She appears well-developed and well-nourished. No distress.  HENT:   Head: Normocephalic.     Nose: Nose normal.   Mouth/Throat: No oropharyngeal exudate.  Eyes: Conjunctivae and EOM are normal. Right eye exhibits no discharge. Left eye exhibits no discharge. No scleral icterus.  Neck: Normal range of motion. Neck supple. No JVD present. No tracheal deviation present. No thyromegaly present.  Cardiovascular: Normal rate, regular rhythm, normal heart sounds and intact distal pulses.    No murmur heard. Pulmonary/Chest: Effort normal and breath sounds normal. No respiratory distress. She has no wheezes. She has no rales. She exhibits no tenderness.  Abdominal: Soft. Bowel sounds are normal. She exhibits no distension and no mass. There is tenderness. There is no rebound and no guarding.    Musculoskeletal: Normal range of motion. She exhibits no edema and no tenderness.  Lymphadenopathy:    She has no cervical adenopathy.  Neurological: She is alert and oriented to person, place, and time. She exhibits normal muscle tone. Coordination normal.  Skin: Skin is warm and dry. No  rash noted. She is not diaphoretic. No erythema. No pallor.  Psychiatric: She has a normal mood and affect. Her behavior is normal. Judgment and thought content normal.    Data Reviewed I have reviewed Dr. Pharr's office records, the operative note from [place] 2010, and her list of surgeries.   Assessment Suspect ventral incisional hernia, mid and lower abdomen, possibly incarcerated.status post laparoscopic cholecystectomy and primary or periumbilical hernia 2010.   Status post laparoscopic tubal ligation.   Status post hysterectomy and appendectomy.   History deep venous thrombosis left leg, now off Coumadin.   To be evaluated for sleep apnea.   Plan Scheduled for CT scan of abdomen and pelvis.   Return to see me in 2-3 weeks. If the CT scan confirms a ventral hernia then we will discuss options for surgical intervention. ADDENDUM: (04/03/2011)  CT shows ventral hernia containing small bowel, but no obstruction.  I called patient and discussed findings with her and told her that she would benefit from repair with mesh. She is doing fine at home. She will see me in office Oct. 29.     Runette Scifres M 03/16/2011, 12:08 PM         Previous Version          Not recorded         Orders Placed This Encounter       Future Orders    CT Abdomen Pelvis W Contrast [IMG794 Custom]    Expires: 06/14/12         Patient Instructions     I suspect that you have a ventral hernia in your mid and lower abdomen. You will be scheduled for a CT scan to better define the extent of the muscle defect. You  will return to see me in 3 weeks after this is done for further discussion and possible planning for surgery.       Level of Service     PR OFFICE CONSULTATION,LEVEL IV [99244]      Follow-up and Disposition     Return in about 3 weeks (around 04/06/2011) for after CT scan.       Follow-up and Disposition History Recorded        All  Flowsheet Templates (all recorded)     Encounter Vitals Flowsheet    Custom Formula Data Flowsheet    Anthropometrics Flowsheet               Referring Provider          Walter Davidson Pharr,   MD       All Charges for This Encounter       Code Description Service Date Service Provider Modifiers Quantity    99244 PR OFFICE CONSULTATION,LEVEL IV 03/16/2011 Hina Gupta M Jaydyn Bozzo, MD   1        Other Encounter Related Information     Allergies & Medications         Problem List         History         Patient-Entered Questionnaires     No data filed         

## 2011-05-02 NOTE — Interval H&P Note (Signed)
History and Physical Interval Note:   05/02/2011   6:56 AM   Sherry Barnes  has presented today for surgery, with the diagnosis of Ventral hernia  The various methods of treatment have been discussed with the patient and family. After consideration of risks, benefits and other options for treatment, the patient has consented to  Procedure(s): LAPAROSCOPIC VENTRAL HERNIA as a surgical intervention .  The patients' history has been reviewed, patient examined, no change in status, stable for surgery.  I have reviewed the patients' chart and labs.  Questions were answered to the patient's satisfaction.     Ernestene Mention  MD

## 2011-05-02 NOTE — Interval H&P Note (Signed)
History and Physical Interval Note:   05/02/2011   6:22 AM   Sherry Barnes  has presented today for surgery, with the diagnosis of Ventral hernia  The various methods of treatment have been discussed with the patient and family. After consideration of risks, benefits and other options for treatment, the patient has consented to  Procedure(s): LAPAROSCOPIC VENTRAL HERNIA as a surgical intervention .  The patients' history has been reviewed, patient examined, no change in status, stable for surgery.  I have reviewed the patients' chart and labs.  Questions were answered to the patient's satisfaction.     Ernestene Mention  MD

## 2011-05-03 MED ORDER — HYDROCODONE-ACETAMINOPHEN 10-325 MG PO TABS
2.0000 | ORAL_TABLET | ORAL | Status: DC | PRN
  Administered 2011-05-03 (×3): 2 via ORAL
  Administered 2011-05-04: 1 via ORAL
  Administered 2011-05-05 – 2011-05-06 (×3): 2 via ORAL
  Filled 2011-05-03 (×7): qty 2

## 2011-05-03 NOTE — Progress Notes (Signed)
1 Day Post-Op  Subjective: The patient is awake and alert. Pain control is good. No nausea or vomiting. No shortness of breath.Tolerating sips of liquids. No flatus or stool. Good urine output with Foley in place. As dangled at edge of bed.  Objective: Vital signs in last 24 hours: Temp:  [97 F (36.1 C)-98.9 F (37.2 C)] 98.9 F (37.2 C) (11/13 2255) Pulse Rate:  [70-84] 81  (11/13 2255) Resp:  [9-20] 20  (11/13 2255) BP: (117-143)/(64-88) 117/74 mmHg (11/13 2255) SpO2:  [93 %-100 %] 93 % (11/13 2255)    Intake/Output from previous day: 11/13 0701 - 11/14 0700 In: 4040 [P.O.:1440; I.V.:1500; IV Piggyback:500] Out: 2880 [Urine:2850; Drains:30] Intake/Output this shift: Total I/O In: 1440 [P.O.:1440] Out: 1900 [Urine:1900]  Resp: clear to auscultation bilaterally GI: abdomen is soft. Not distended. Minimal bowel sounds. Appropriate incisional tenderness. Wound intact with no superficial bleeding. Jackson-Pratt drain with mild to moderate serosanguineous drainage. Extremities: extremities normal, atraumatic, no cyanosis or edema  Lab Results:  Results for orders placed during the hospital encounter of 05/02/11 (from the past 24 hour(s))  CBC     Status: Abnormal   Collection Time   05/02/11  2:37 PM      Component Value Range   WBC 10.1  4.0 - 10.5 (K/uL)   RBC 3.98  3.87 - 5.11 (MIL/uL)   Hemoglobin 11.6 (*) 12.0 - 15.0 (g/dL)   HCT 30.8 (*) 65.7 - 46.0 (%)   MCV 87.9  78.0 - 100.0 (fL)   MCH 29.1  26.0 - 34.0 (pg)   MCHC 33.1  30.0 - 36.0 (g/dL)   RDW 84.6  96.2 - 95.2 (%)   Platelets 277  150 - 400 (K/uL)  CREATININE, SERUM     Status: Normal   Collection Time   05/02/11  2:37 PM      Component Value Range   Creatinine, Ser 0.61  0.50 - 1.10 (mg/dL)   GFR calc non Af Amer >90  >90 (mL/min)   GFR calc Af Amer >90  >90 (mL/min)     Studies/Results: @RISRSLT24 @     . bupivacaine liposome  20 mL Infiltration Once  . calcium-vitamin D  1 tablet Oral Daily  .  ceFAZolin (ANCEF) IV  2 g Intravenous Q8H  . cholecalciferol  2,000 Units Oral Daily  . cyanocobalamin  500 mcg Oral Daily  . heparin  5,000 Units Subcutaneous Once  . heparin  5,000 Units Subcutaneous Q8H  . HYDROmorphone      . ketorolac  15 mg Intravenous Q6H  . ketorolac      . DISCONTD: ceFAZolin (ANCEF) IV  2 g Intravenous 60 min Pre-Op     Assessment/Plan: s/p Procedure(s): LAPAROSCOPIC/open VENTRAL HERNIA d/c foley Full liquid diet. Begin ambulation. Offer PO analgesic Decrease IV rate to 75 cc per hour. Patient Active Hospital Problem List: No active hospital problems.   LOS: 1 day    Teigan Manner M 05/03/2011  . .prob

## 2011-05-03 NOTE — Plan of Care (Signed)
Problem: Phase I Progression Outcomes Goal: OOB as tolerated unless otherwise ordered Outcome: Progressing OOb x1 this shift/BRP without difficulty

## 2011-05-03 NOTE — Progress Notes (Signed)
Chart reviewed and UR completed. 

## 2011-05-04 MED ORDER — POLYETHYLENE GLYCOL 3350 17 G PO PACK
17.0000 g | PACK | Freq: Every day | ORAL | Status: DC
  Administered 2011-05-04: 17 g via ORAL
  Filled 2011-05-04 (×2): qty 1

## 2011-05-04 NOTE — Progress Notes (Signed)
2 Days Post-Op  Subjective: She is making progress. Foley catheter is out and she is voiding uneventfully. She has been ambulating in the halls. She is passing flatus but has not had a bowel movement. Pain is under reasonable control with oral hydrocodone. She is tolerating p.o. liquids reasonably well although occasionally she feels a little nauseated. No respiratory problems.  Objective: Vital signs in last 24 hours: Temp:  [98.2 F (36.8 C)-99 F (37.2 C)] 98.2 F (36.8 C) (11/14 2216) Pulse Rate:  [76-88] 76  (11/14 2216) Resp:  [18-20] 20  (11/14 2216) BP: (101-125)/(59-75) 116/75 mmHg (11/14 2216) SpO2:  [92 %-98 %] 98 % (11/14 2216)    Intake/Output from previous day: 11/14 0701 - 11/15 0700 In: 2160 [P.O.:1560; I.V.:600] Out: 1800 [Urine:1750; Drains:50] Intake/Output this shift: Total I/O In: -  Out: 1250 [Urine:1250]  General appearance: alert Resp: clear to auscultation bilaterally GI: abdomen is generally soft and does not appear distended. Bowel sounds are present. The midline wound is clean. Jackson-Pratt drainage is less and is serosanguineous in nature. Tenderness is appropriate for the incision.  Lab Results:  No results found for this or any previous visit (from the past 24 hour(s)).   Studies/Results: @RISRSLT24 @     . bupivacaine liposome  20 mL Infiltration Once  . calcium-vitamin D  1 tablet Oral Daily  . ceFAZolin (ANCEF) IV  2 g Intravenous Q8H  . cholecalciferol  2,000 Units Oral Daily  . cyanocobalamin  500 mcg Oral Daily  . heparin  5,000 Units Subcutaneous Q8H  . polyethylene glycol  17 g Oral Daily     Assessment/Plan: s/p Procedure(s): LAPAROSCOPIC VENTRAL HERNIA decrease IV rate. advance diet as tolerated Miralax one dose today to promote bowel function Teach drain care and record keeping to patient and family Possible discharge tomorrow Patient Active Hospital Problem List: No active hospital problems.   LOS: 2 days     Jeremi Losito M 05/04/2011  . .prob

## 2011-05-05 ENCOUNTER — Encounter (HOSPITAL_COMMUNITY): Payer: Self-pay | Admitting: General Surgery

## 2011-05-05 MED ORDER — PROMETHAZINE HCL 25 MG/ML IJ SOLN: 6.2500 mg | INTRAMUSCULAR | Status: DC | PRN

## 2011-05-05 MED ORDER — KCL IN DEXTROSE-NACL 20-5-0.45 MEQ/L-%-% IV SOLN
INTRAVENOUS | Status: DC
  Administered 2011-05-05: 18:00:00 via INTRAVENOUS
  Filled 2011-05-05 (×3): qty 1000

## 2011-05-05 MED ORDER — POLYETHYLENE GLYCOL 3350 17 G PO PACK
17.0000 g | PACK | Freq: Two times a day (BID) | ORAL | Status: DC
  Administered 2011-05-05 – 2011-05-06 (×2): 17 g via ORAL
  Filled 2011-05-05 (×4): qty 1

## 2011-05-05 NOTE — Progress Notes (Signed)
3 Days Post-Op  Subjective: Patient is awake and alert and reasonably comfortable. She does have some pain. She is eating better. She is passing flatus. She is frustrated that she has not had a bowel movement. She does have constipation problems at home. She is voiding well. No wound problems are reported by nursing. She is ambulatory.  Objective: Vital signs in last 24 hours: Temp:  [98.1 F (36.7 C)-99.9 F (37.7 C)] 98.6 F (37 C) (11/16 0529) Pulse Rate:  [77-85] 77  (11/16 0529) Resp:  [16-18] 18  (11/16 0529) BP: (113-147)/(60-90) 113/76 mmHg (11/16 0529) SpO2:  [91 %-100 %] 95 % (11/16 0529) Weight:  [199 lb 8.3 oz (90.5 kg)] 199 lb 8.3 oz (90.5 kg) (11/15 1703) Last BM Date: 05/01/11  Intake/Output from previous day: 11/15 0701 - 11/16 0700 In: 2233 [P.O.:480; I.V.:1753] Out: 4150 [Urine:4100; Drains:50] Intake/Output this shift: Total I/O In: 1753 [I.V.:1753] Out: 725 [Urine:700; Drains:25]  Resp: clear to auscultation bilaterally GI: abdomen is reasonably soft. Minimal incisional tenderness. Midline wound with staples in place. No signs of any infection or necrosis. The drainage is thin, serosanguineous, 50 cc over last 24 hours. Active bowel sounds.  Lab Results:  No results found for this or any previous visit (from the past 24 hour(s)).   Studies/Results: @RISRSLT24 @     . bupivacaine liposome  20 mL Infiltration Once  . calcium-vitamin D  1 tablet Oral Daily  . cholecalciferol  2,000 Units Oral Daily  . cyanocobalamin  500 mcg Oral Daily  . heparin  5,000 Units Subcutaneous Q8H  . polyethylene glycol  17 g Oral Daily     Assessment/Plan: s/p Procedure(s): LAPAROSCOPIC VENTRAL HERNIA continue ambulation as much as possible.MiraLAX will be given twice daily. Given her concerns and chronic constipation problems, I think it would be wise to ensure that bowel function has returned prior to discharge home.  The  patient is already minimizing narcotic  use.  Hopefully discharge tomorrow, probably with drain in place. Patient Active Hospital Problem List: No active hospital problems.   LOS: 3 days    Nautica Hotz M 05/05/2011  . .prob

## 2011-05-06 MED ORDER — HYDROCODONE-ACETAMINOPHEN 10-325 MG PO TABS: 2.0000 | ORAL_TABLET | ORAL | Status: AC | PRN

## 2011-05-06 NOTE — Progress Notes (Signed)
4 Days Post-Op  Subjective: Doing well. Minimal pain. Several bowel movements. Tolerating diet. Understands drain care. Wants to go home.  Objective: Vital signs in last 24 hours: Temp:  [97.7 F (36.5 C)-98.2 F (36.8 C)] 98.1 F (36.7 C) (11/17 0552) Pulse Rate:  [71-84] 71  (11/17 0552) Resp:  [18] 18  (11/17 0552) BP: (119-147)/(72-84) 119/72 mmHg (11/17 0552) SpO2:  [97 %-98 %] 98 % (11/17 0552) Last BM Date: 05/05/11  Intake/Output from previous day: 11/16 0701 - 11/17 0700 In: 2038 [P.O.:840; I.V.:1198] Out: 4579 [Urine:4550; Drains:25; Stool:4] Intake/Output this shift: Total I/O In: 1198 [I.V.:1198] Out: 2025 [Urine:2000; Drains:25]  GI: abdomen is soft, nondistended, minimally tender. Active bowel sounds. All wounds clean with no signs of infection. Drainage is serosanguineous and odorless. 25 cc out in the last 24 hours.  Lab Results:  No results found for this or any previous visit (from the past 24 hour(s)).   Studies/Results: @RISRSLT24 @     . calcium-vitamin D  1 tablet Oral Daily  . cholecalciferol  2,000 Units Oral Daily  . cyanocobalamin  500 mcg Oral Daily  . heparin  5,000 Units Subcutaneous Q8H  . polyethylene glycol  17 g Oral BID  . DISCONTD: bupivacaine liposome  20 mL Infiltration Once  . DISCONTD: polyethylene glycol  17 g Oral Daily     Assessment/Plan: s/p Procedure(s): LAPAROSCOPIC VENTRAL HERNIA Discharge Patient Active Hospital Problem List: No active hospital problems.   LOS: 4 days    Amica Harron M 05/06/2011  . .prob

## 2011-05-06 NOTE — Discharge Summary (Signed)
  Patient ID: Sherry Barnes [id number] 65 y.o. 1945/10/17  05/02/2011  Discharge date and time: No discharge date for patient encounter.  Admitting Physician: Ernestene Mention  Discharge Physician: Ernestene Mention  Admission Diagnoses: Ventral hernia  Discharge Diagnoses: incarcerated ventral incisional hernia  Operations: Procedure(s): LAPAROSCOPIC VENTRAL HERNIA  Admission Condition: good  Discharged Condition: good  Indication for Admission: the patient was admitted to the hospital electively for repair of a painful incarcerated ventral incisional hernia.  Hospital Course: the patient was admitted to the hospital electively on 05/02/2011. She was taken to the operating room and underwent diagnostic laparoscopy. Laparoscopically we were able to take down all the adhesions and the transverse colon out of the hernia. The hernia defect was 8 cm or greater and the hernia sac was almost 30 cm in diameter and so we elected not to bridge this with a laparoscopic mesh. She was converted to an open midline incision. We removed all of the hernia sac and then repaired the hernia with an inlay mesh and closed the fascia over top of it would be replaced a drain and closed the wound.  Postoperatively she did well. Over the next 4 days she slowly but steadily progressed in her diet and activities. She began having bowel movements on the third postoperative day and on the fourth postoperative day she was ready to go home.  She was discharged on Saturday, November 17. That time she felt well. Her abdomen was soft and her wound was healing uneventfully. She had been drainage from her Jackson-Pratt drain but it was still too much to remove it so she was discharged home with the drain. She was given a prescription for Vicodin for pain. She was given instructions in diet and activities. She was asked to return to my office in 4 days to check the wound, possibly remove the staples and the  drain.  Consults: none  Significant Diagnostic Studies: none  Treatments: surgery:   Disposition: Home  Patient Instructions:   Sherry, Barnes  Home Medication Instructions [id number]   Printed on:05/06/11 9562  Medication Information                    cholecalciferol (VITAMIN D) 1000 UNITS tablet Take 2,000 Units by mouth daily. PT STATES SHE TAKES VITAMIN D3           Calcium Carbonate-Vitamin D (CALTRATE 600+D) 600-400 MG-UNIT per tablet Take 1 tablet by mouth daily.             vitamin B-12 (CYANOCOBALAMIN) 500 MCG tablet Take 500 mcg by mouth daily.            triamcinolone (KENALOG) 0.1 % cream Apply 1 application topically 2 (two) times daily as needed. IF OUTBREAK OF BUMPS TIP  OF NOSE           HYDROcodone-acetaminophen (NORCO) 10-325 MG per tablet Take 2 tablets by mouth every 4 (four) hours as needed for pain.             Activity: no lifting, driving, or strenuous exercise for 4 weeks. You may drive record and 1-30 days, but no heavy lifting for one month. Diet: low fat, low cholesterol diet Wound Care: as directed  Follow-up:  With [facility] surgery in 4 days for staple removal and drain removal.  .  Signed: Ernestene Mention  MD, FACS  05/06/2011, 6:24 AM

## 2011-05-09 ENCOUNTER — Telehealth (INDEPENDENT_AMBULATORY_CARE_PROVIDER_SITE_OTHER): Payer: Self-pay

## 2011-05-09 NOTE — Telephone Encounter (Signed)
Pt home doing well. Po appt made for 12-6 with Dr Derrell Lolling.

## 2011-05-10 ENCOUNTER — Encounter (INDEPENDENT_AMBULATORY_CARE_PROVIDER_SITE_OTHER): Payer: Self-pay | Admitting: General Surgery

## 2011-05-10 ENCOUNTER — Ambulatory Visit (INDEPENDENT_AMBULATORY_CARE_PROVIDER_SITE_OTHER): Payer: Managed Care, Other (non HMO) | Admitting: General Surgery

## 2011-05-10 DIAGNOSIS — K436 Other and unspecified ventral hernia with obstruction, without gangrene: Secondary | ICD-10-CM

## 2011-05-10 NOTE — Progress Notes (Signed)
Subjective:     Patient ID: Sherry Barnes, female   DOB: [birth year], 65 y.o.   MRN: [id number]  HPI The patient is a 65 year old white female who is now 8 days out from a laparoscopic ventral hernia repair with mesh. A drain was left in the subcutaneous tissue. It is putting out minimal amounts of serous fluid over the last 3 or 4 days.  Review of Systems     Objective:   Physical Exam On exam her abdomen is soft with minimal tenderness. Her incision seemed to be healing nicely. There is no evidence of infection. The drain was removed without difficulty. She tolerated this well    Assessment:     8 days out from a laparoscopic ventral hernia repair with mesh.    Plan:     The drain was removed. She will followup next week with Dr. Derrell Lolling.

## 2011-05-10 NOTE — Patient Instructions (Signed)
May shower tomorrow No lifting

## 2011-05-15 ENCOUNTER — Encounter (INDEPENDENT_AMBULATORY_CARE_PROVIDER_SITE_OTHER): Payer: Self-pay | Admitting: General Surgery

## 2011-05-15 ENCOUNTER — Ambulatory Visit (INDEPENDENT_AMBULATORY_CARE_PROVIDER_SITE_OTHER): Payer: Managed Care, Other (non HMO) | Admitting: General Surgery

## 2011-05-15 VITALS — BP 128/78 | HR 68 | Temp 98.3°F | Resp 18 | Ht 63.0 in | Wt 199.0 lb

## 2011-05-15 DIAGNOSIS — Z9889 Other specified postprocedural states: Secondary | ICD-10-CM

## 2011-05-15 NOTE — Patient Instructions (Signed)
Your surgical wounds are healing well. There is no sign of any complication. The staples were removed today. You may resume all normal physical activities without restriction after Christmas. Return to see me in 6 weeks.

## 2011-05-15 NOTE — Progress Notes (Signed)
Subjective:     Patient ID: Sherry Barnes, female   DOB: 1946-03-11, 65 y.o.   MRN: [id number]  HPI Basically doing well following open repair of incarcerated ventral hernia mesh.  Date of surgery May 02, 2011. She has no complaints and is feeling better all the time.  Review of Systems     Objective:   Physical Exam All of her wounds have healed nicely. Staples are removed and Steri-Strips are applied. No residual fluid collection. No infection.    Assessment:     Incarcerated ventral incisional hernia, recovering nicely in the early postop period following laparoscopic lysis of adhesions and open repair with inlay mesh.   Plan:     She is advised to take a one hour walk on a daily basis if possible. No sports or heavy lifting until after Christmas day. Return to see me in 6 weeks.

## 2011-05-25 ENCOUNTER — Encounter (INDEPENDENT_AMBULATORY_CARE_PROVIDER_SITE_OTHER): Payer: Managed Care, Other (non HMO) | Admitting: General Surgery

## 2011-07-04 ENCOUNTER — Encounter (INDEPENDENT_AMBULATORY_CARE_PROVIDER_SITE_OTHER): Payer: Self-pay | Admitting: General Surgery

## 2011-07-04 ENCOUNTER — Ambulatory Visit (INDEPENDENT_AMBULATORY_CARE_PROVIDER_SITE_OTHER): Payer: 59 | Admitting: General Surgery

## 2011-07-04 VITALS — BP 142/90 | HR 85 | Temp 97.2°F | Ht 63.0 in | Wt 195.4 lb

## 2011-07-04 DIAGNOSIS — M79602 Pain in left arm: Secondary | ICD-10-CM

## 2011-07-04 DIAGNOSIS — Z9889 Other specified postprocedural states: Secondary | ICD-10-CM

## 2011-07-04 DIAGNOSIS — M79609 Pain in unspecified limb: Secondary | ICD-10-CM

## 2011-07-04 NOTE — Progress Notes (Signed)
Subjective:     Patient ID: Sherry Barnes, female   DOB: [birth year], 66 y.o.   MRN: [id number]  HPI This patient unde a complex ventral hernia repair with mesh on May 02, 2011. May 02, 2011.She has recovered from that surgery fairly well and is very pleased with her progress to date. She has no abdominal pain. She has no  constipation or cramping. She has no problems. She is fully active.  She asked about pain in her left elbow. She says this has been bothering her for about one month. She has a past history of left ulnar nerve transposition surgery elsewhere.  Review of Systems     Objective:   Physical Exam Patient looks well and is in no distress.  Abdomen examined standing. Incision well healed. Her repair appears intact. No fluid collections. Nontender.  Extremities left arm is examined. There is a scar posterior medial elbow. There is no swelling. There is no cellulitis. There is no superficial thrombosis. Range of motion of the elbow and fingers seen grossly normal. Not really tender..   Assessment:     Complex ventral incisional hernia, incarcerated, uneventful recovery following complex repair with inlay mesh.  Left elbow pain. I am uncertain as to the cause of this. She would like further evaluation with a specialist.    Plan:     Diet and activities discussed. Basically she is not any physical restriction from here on out but should avoid excessive lifting.  I advised weight reduction, perhaps 40 pounds over the next 2 years.  She'll be referred to a hand surgeon regarding her left elbow pain  Return to see me p.r.n.   Angelia Mould. Derrell Lolling, M.D., [facility] General and Minimally invasive Surgery Breast and Colorectal Surgery Office:   [phone] Pager:   [phone]

## 2011-07-04 NOTE — Patient Instructions (Signed)
You seem to have recovered uneventfully from your ventral hernia repair. You may resume exercising in the way we discussed. Return to see me if any further problems arise.  In terms of your left elbow pain, I am uncertain as to the cause of that. Because of your previous elbow surgery and ulnar nerve transposition, I'm going to refer you to one of the hand surgeons  for further evaluation.

## 2011-11-05 ENCOUNTER — Encounter (HOSPITAL_COMMUNITY): Payer: Self-pay | Admitting: Emergency Medicine

## 2011-11-05 ENCOUNTER — Emergency Department (INDEPENDENT_AMBULATORY_CARE_PROVIDER_SITE_OTHER): Payer: Self-pay

## 2011-11-05 ENCOUNTER — Emergency Department (HOSPITAL_COMMUNITY)
Admission: EM | Admit: 2011-11-05 | Discharge: 2011-11-05 | Disposition: A | Discharge status: Home / Self Care | Payer: Self-pay | Source: Home / Self Care | Attending: Emergency Medicine | Admitting: Emergency Medicine

## 2011-11-05 DIAGNOSIS — T148XXA Other injury of unspecified body region, initial encounter: Secondary | ICD-10-CM

## 2011-11-05 DIAGNOSIS — J329 Chronic sinusitis, unspecified: Secondary | ICD-10-CM

## 2011-11-05 DIAGNOSIS — K625 Hemorrhage of anus and rectum: Secondary | ICD-10-CM

## 2011-11-05 MED ORDER — AMOXICILLIN 500 MG PO CAPS: 1000.0000 mg | ORAL_CAPSULE | Freq: Three times a day (TID) | ORAL | Status: AC

## 2011-11-05 NOTE — Discharge Instructions (Signed)
Follow up with Dr. Renne Crigler about the rectal bleeding.  Finish all of the antibiotics, even if you are feeling better. Also talk with Dr. Renne Crigler if you feel you are not getting better, if you feel the antibiotics are not helping you.   Try soaking in warm water with epsom salt in it to help relieve your back/hip pain.   Sinusitis Sinuses are air pockets within the bones of your face. The growth of bacteria within a sinus leads to infection. The infection prevents the sinuses from draining. This infection is called sinusitis. SYMPTOMS  There will be different areas of pain depending on which sinuses have become infected.  The maxillary sinuses often produce pain beneath the eyes.   Frontal sinusitis may cause pain in the middle of the forehead and above the eyes.  Other problems (symptoms) include:  Toothaches.   Colored, pus-like (purulent) drainage from the nose.   Swelling, warmth, and tenderness over the sinus areas may be signs of infection.  TREATMENT  Sinusitis is most often determined by an exam.X-rays may be taken. If x-rays have been taken, make sure you obtain your results or find out how you are to obtain them. Your caregiver may give you medications (antibiotics). These are medications that will help kill the bacteria causing the infection. You may also be given a medication (decongestant) that helps to reduce sinus swelling.  HOME CARE INSTRUCTIONS   Only take over-the-counter or prescription medicines for pain, discomfort, or fever as directed by your caregiver.   Drink extra fluids. Fluids help thin the mucus so your sinuses can drain more easily.   Applying either moist heat or ice packs to the sinus areas may help relieve discomfort.   Use saline nasal sprays to help moisten your sinuses. The sprays can be found at your local drugstore.  SEEK IMMEDIATE MEDICAL CARE IF:  You have a fever.   You have increasing pain, severe headaches, or toothache.   You have nausea,  vomiting, or drowsiness.   You develop unusual swelling around the face or trouble seeing.  MAKE SURE YOU:   Understand these instructions.   Will watch your condition.   Will get help right away if you are not doing well or get worse.  Document Released: 06/05/2005 Document Revised: 05/25/2011 Document Reviewed: 01/02/2007 [facility] Patient Information 2012 [place], [place].Rectal Bleeding  Rectal bleeding is when blood comes out of the opening of the butt (anus). Rectal bleeding may show up as bright red blood or really dark poop (stool). The poop may look dark red, maroon, or black. Rectal bleeding is often a sign that something is wrong. This needs to be checked by a doctor.  HOME CARE  Eat a diet high in fiber. This will help keep your poop soft.   Limit activitiy.   Drink enough fluids to keep your pee (urine) clear or pale yellow.   Take a warm bath to soothe any pain.   Follow up with your doctor as told.  GET HELP RIGHT AWAY IF:  You have more bleeding.   You have black or dark red poop.   You throw up (vomit) blood or it looks like coffee grounds.   You have belly (abdominal) pain or tenderness.   You have a fever.   You feel weak, sick to your stomach (nauseous), or you pass out (faint).   You have pain that is so bad you cannot poop (bowel movement).  MAKE SURE YOU:  Understand these instructions.  Will watch your condition.   Will get help right away if you are not doing well or get worse.  Document Released: 02/15/2011 Document Revised: 05/25/2011 Document Reviewed: 02/15/2011 [facility] Patient Information 2012 [place], [place].Muscle Strain A muscle strain (pulled muscle) happens when a muscle is over-stretched. Recovery usually takes 5 to 6 weeks.  HOME CARE   Put ice on the injured area.   Put ice in a plastic bag.   Place a towel between your skin and the bag.   Leave the ice on for 15 to 20 minutes at a time, every hour for the first 2 days.     Do not use the muscle for several days or until your doctor says you can. Do not use the muscle if you have pain.   Wrap the injured area with an elastic bandage for comfort. Do not put it on too tightly.   Only take medicine as told by your doctor.   Warm up before exercise. This helps prevent muscle strains.  GET HELP RIGHT AWAY IF:  There is increased pain or puffiness (swelling) in the affected area. MAKE SURE YOU:   Understand these instructions.   Will watch your condition.   Will get help right away if you are not doing well or get worse.  Document Released: 03/14/2008 Document Revised: 05/25/2011 Document Reviewed: 03/14/2008 [facility] Patient Information 2012 [place], [place].

## 2011-11-05 NOTE — ED Notes (Signed)
Cough, fever, feeling tired, reports head and chest  Congestion, reports green -phlegm and loss of voice for 2 days.  Reports illness has lasted longer than usual.  Reports one day of noticing blood with stool, noticed with wiping.  Right hip pain.

## 2011-11-05 NOTE — ED Provider Notes (Signed)
History     CSN: [id number]  Arrival date & time 11/05/11  1415   None     Chief Complaint  Patient presents with  . URI    (Consider location/radiation/quality/duration/timing/severity/associated sxs/prior treatment) HPI Comments: Pt has been tx self with otc cold medicines for no relief.  Also c/o R hip pain since working in yard 2 weeks ago and 1 episode of rectal bleeding about 5 days ago.  Rectal bleeding occurred on toilet after void, not after bowel movement. Wiped and found blood on toilet paper x 5 wipes.  Bright red blood.  Pt feels bleeding was from anal area and not from vagina.  Hx internal hemorrhoids. Last normal colonoscopy was approx 2 years ago.   Patient is a 66 y.o. female presenting with URI. The history is provided by the patient.  URI The primary symptoms include fever, fatigue, cough and myalgias. Primary symptoms do not include sore throat. The current episode started 6 to 7 days ago. This is a new problem. The problem has been gradually worsening.  The fever began 6 to 7 days ago. The fever has been unchanged since its onset. The maximum temperature recorded prior to her arrival was 102 to 102.9 F.  The cough began 6 to 7 days ago. The cough is new. The cough is productive. The sputum is green.  Symptoms associated with the illness include chills and congestion. The illness is not associated with sinus pressure.    Past Medical History  Diagnosis Date  . Abdominal pain   . History of TIAs   . Osteoarthritis   . DVT of leg (deep venous thrombosis)     left  . History of UTI   . History of syncope     as a child only  . Osteopenia   . Lichen simplex     on scalp  . Hyperlipidemia     slight  . Rectal bleeding     seldom  . Incontinence   . History of blood clots   . Wears glasses   . Cancer     vulvar carcinoma in situ, resulting in vulvar excision  . Ventral hernia   . PONV (postoperative nausea and vomiting)   . Hemorrhoids   . Left arm pain      Past Surgical History  Procedure Date  . Appendectomy   . Bilateral ovarian dermoid cystectomy 04/06/1971  . Bladder repair     due to incontinence  . Condylectomy 06/16/1999    left foot, fifth toe  . Gynecologic cryosurgery 1995    for cervical dysplasia  . Transposition of ulnar nerve 02/24/2002    left elbow  . Cervical cone biopsy 05/15/1972  . Tubal ligation 12/22/1982    bilateral  . Abdominal hysterectomy 10/21/1996  . Colposcopy vulva 06/26/2007    wide local excision of left vulva  . Hernia repair 01/27/2009    ventral umbilical   . Cholecystectomy 01/27/09  . Tonsillectomy   . Carpel tunnel     left   PT STATES SHE HAS NOT HAD CARPAL TUNNEL SURGERY  . Ventral hernia repair 05/02/2011    Procedure: LAPAROSCOPIC VENTRAL HERNIA;  Surgeon: Ernestene Mention, MD;  Location: [facility];  Service: General;  Laterality: N/A;  attempted Laparoscopic ventraL hernia repair with mesh, open ventral hernia repair     Family History  Problem Relation Age of Onset  . Irritable bowel syndrome Mother   . Other Mother     bladder infections,  gallstones, low thyroid  . Heart disease Father     congestive heart failure - 3 blocked arteries  . Emphysema Father   . Parkinsonism Father   . Other Sister     gallstones  . Other Brother     reflux, mitral valve prolapse    History  Substance Use Topics  . Smoking status: Former Smoker    Quit date: 02/17/1974  . Smokeless tobacco: Never Used  . Alcohol Use: No    OB History    Grav Para Term Preterm Abortions TAB SAB Ect Mult Living                  Review of Systems  Constitutional: Positive for fever, chills and fatigue.  HENT: Positive for congestion and voice change. Negative for sore throat and sinus pressure.        Purulent nasal discharge is green color  Respiratory: Positive for cough.        Purulent green sputum  Gastrointestinal:       Rectal bleeding  Musculoskeletal: Positive for myalgias.       R lower  back/hip pain    Allergies  Review of patient's allergies indicates no known allergies.  Home Medications   Current Outpatient Rx  Name Route Sig Dispense Refill  . BISOPROLOL FUMARATE 10 MG PO TABS Oral Take 2.5 mg by mouth daily.    . NYQUIL PO Oral Take by mouth.    . DAYQUIL PO Oral Take by mouth.    . ROSUVASTATIN CALCIUM 5 MG PO TABS Oral Take 5 mg by mouth daily.    . AMOXICILLIN 500 MG PO CAPS Oral Take 2 capsules (1,000 mg total) by mouth 3 (three) times daily. 60 capsule 0  . CALCIUM CARBONATE-VITAMIN D 600-400 MG-UNIT PO TABS Oral Take 1 tablet by mouth daily.      Marland Kitchen VITAMIN D 1000 UNITS PO TABS Oral Take 2,000 Units by mouth daily. PT STATES SHE TAKES VITAMIN D3    . TRIAMCINOLONE ACETONIDE 0.1 % EX CREA Topical Apply 1 application topically 2 (two) times daily as needed. IF OUTBREAK OF BUMPS TIP  OF NOSE    . VITAMIN B-12 500 MCG PO TABS Oral Take 500 mcg by mouth daily.       BP 156/90  Pulse 88  Temp(Src) 100 F (37.8 C) (Oral)  Resp 18  SpO2 98%  Physical Exam  Constitutional: She appears well-developed and well-nourished. No distress.  HENT:  Right Ear: Tympanic membrane and external ear normal.  Left Ear: Tympanic membrane, external ear and ear canal normal.  Nose: Mucosal edema present. Right sinus exhibits no maxillary sinus tenderness and no frontal sinus tenderness. Left sinus exhibits no maxillary sinus tenderness and no frontal sinus tenderness.  Mouth/Throat: Oropharynx is clear and moist.       Cerumen R canal. Tonsils absent  Cardiovascular: Normal rate and regular rhythm.   Pulmonary/Chest: Effort normal and breath sounds normal.  Genitourinary: Rectal exam shows no external hemorrhoid.  Musculoskeletal:       Right hip: She exhibits tenderness. She exhibits normal range of motion, normal strength and no swelling.       Legs: Skin: Skin is warm and dry. No rash noted.    ED Course  Procedures (including critical care time)  Labs Reviewed -  No data to display Dg Chest 2 View  11/05/2011  *RADIOLOGY REPORT*  Clinical Data: Purulent sputum.  Fever, cough.  CHEST - 2 VIEW  Comparison:  Chest x-ray 06/24/2010.  Findings: Lung volumes are normal.  No consolidative airspace disease.  No pleural effusions.  No pneumothorax.  No pulmonary nodule or mass noted.  Pulmonary vasculature and the cardiomediastinal silhouette are within normal limits.  Multiple calcified right hilar lymph nodes are noted.  IMPRESSION: 1. No radiographic evidence of acute cardiopulmonary disease. .  Original Report Authenticated By: Florencia Reasons, M.D.     1. Sinusitis   2. Muscle strain   3. Rectal bleeding       MDM  Discussed rectal bleeding episode with pt. Bleeding most likely related to known internal hemorrhoids.  Recommended pt f/u with pcp for further help with this problem.  Pt's cxr negative for pneumonia. Will tx pt for sinus infection.  Purulent nasal drainage and fever for several days despite tx with otc meds.        Cathlyn Parsons, NP 11/05/11 1643  Cathlyn Parsons, NP 11/05/11 [phone]

## 2011-11-05 NOTE — ED Provider Notes (Signed)
Medical screening examination/treatment/procedure(s) were performed by non-physician practitioner and as supervising physician I was immediately available for consultation/collaboration.  Leslee Home, M.D.   Reuben Likes, MD 11/05/11 [phone]

## 2012-09-25 ENCOUNTER — Ambulatory Visit: Payer: PRIVATE HEALTH INSURANCE | Admitting: Infectious Diseases

## 2012-10-01 ENCOUNTER — Encounter: Payer: Self-pay | Admitting: Infectious Diseases

## 2012-10-01 ENCOUNTER — Ambulatory Visit (INDEPENDENT_AMBULATORY_CARE_PROVIDER_SITE_OTHER): Payer: PRIVATE HEALTH INSURANCE | Admitting: Infectious Diseases

## 2012-10-01 VITALS — BP 120/80 | HR 75 | Temp 98.1°F | Ht 63.0 in | Wt 196.0 lb

## 2012-10-01 DIAGNOSIS — L929 Granulomatous disorder of the skin and subcutaneous tissue, unspecified: Secondary | ICD-10-CM | POA: Insufficient documentation

## 2012-10-01 DIAGNOSIS — J984 Other disorders of lung: Secondary | ICD-10-CM

## 2012-10-01 DIAGNOSIS — J8410 Pulmonary fibrosis, unspecified: Secondary | ICD-10-CM

## 2012-10-01 NOTE — Progress Notes (Signed)
  Subjective:    Patient ID: Sherry Barnes, female    DOB: [birth year], 67 y.o.   MRN: [id number]  HPI 67 yo F with hx of an CXR October 2013 showing "calcified LN or granuloma". States she has had granulomas on CXR before. No f/c. No SOB, occas coughing spells related to allergies (seasonal).  She was seen in urgent care May 2013 for bronchitis/cough and had R sided calcified nodes at that time. May 2001 had CT showing splenic granuloma, granulomatous calcifications in R hilum and RLL. Had PPD+ as a child but repeats were (-).  Grew up in [place], thinks she may have had histoplasmosis (never sick, had a bird as a child).  Denies hx of TB exposure (her paternal grandmother died of TB but pt never met her).    Review of Systems  Constitutional: Negative for fever, chills and unexpected weight change.  Respiratory: Negative for cough and shortness of breath.   Gastrointestinal: Negative for diarrhea and constipation.  Genitourinary: Negative for dyspareunia.  Hematological: Negative for adenopathy.       Objective:   Physical Exam  Constitutional: She appears well-developed and well-nourished.  HENT:  Mouth/Throat: No oropharyngeal exudate.  Eyes: EOM are normal. Pupils are equal, round, and reactive to light.  Neck: Neck supple.  Cardiovascular: Normal rate, regular rhythm and normal heart sounds.   Pulmonary/Chest: Effort normal and breath sounds normal.  Abdominal: Soft. Bowel sounds are normal. There is no tenderness. There is no rebound.  Lymphadenopathy:    She has no cervical adenopathy.    She has no axillary adenopathy.          Assessment & Plan:

## 2012-10-01 NOTE — Assessment & Plan Note (Signed)
Spoke with her that the leading causes of granuloma on CXRs are: TB, histo and sarcoid. Will screen her for these. If these tests are (-) will have her seen by pulmonary for evaluation for bronchoscopy. Remotely, cancer could be possible although her clinical picture does not match this. Will have her return in 2 weeks for results.

## 2012-10-02 LAB — ANGIOTENSIN CONVERTING ENZYME: Angiotensin-Converting Enzyme: 41 U/L (ref 8–52)

## 2012-10-02 LAB — SEDIMENTATION RATE: Sed Rate: 7 mm/hr (ref 0–22)

## 2012-10-07 LAB — FUNGAL ANTIBODIES PANEL, ID-BLOOD
Aspergillus Flavus Antibodies: NEGATIVE
Aspergillus Niger Antibodies: NEGATIVE
Coccidioides Antibody ID: NEGATIVE

## 2012-10-07 LAB — HISTOPLASMA ANTIGEN, URINE: Histoplasma Antigen, urine: <0.5 ng/mL

## 2012-10-15 ENCOUNTER — Encounter: Payer: Self-pay | Admitting: Infectious Diseases

## 2012-10-15 ENCOUNTER — Ambulatory Visit (INDEPENDENT_AMBULATORY_CARE_PROVIDER_SITE_OTHER): Payer: PRIVATE HEALTH INSURANCE | Admitting: Infectious Diseases

## 2012-10-15 VITALS — BP 135/82 | HR 58 | Temp 98.2°F | Ht 63.0 in | Wt 197.8 lb

## 2012-10-15 DIAGNOSIS — J984 Other disorders of lung: Secondary | ICD-10-CM

## 2012-10-15 DIAGNOSIS — J8410 Pulmonary fibrosis, unspecified: Secondary | ICD-10-CM

## 2012-10-15 NOTE — Assessment & Plan Note (Signed)
She is doing well. I am not clear as the cause of her granulomatous lung disease. She believes that she has been exposed to histo in her childhood and that this could be causative. I agree, this could be causative for these lesions. Her histo Ab does not need to be positive for this given how long ago her exposure may have been. She plans to f/u with her PCP in 6 months and consider repeat CT scan at that time.

## 2012-10-15 NOTE — Progress Notes (Signed)
  Subjective:    Patient ID: Sherry Barnes, female    DOB: 06-01-1946, 67 y.o.   MRN: [id number]  HPI 67 yo F with hx of an CXR October 2013 showing "calcified LN or granuloma". States she has had granulomas on CXR before. No f/c. No SOB, occas coughing spells related to allergies (seasonal).  She was seen in urgent care May 2013 for bronchitis/cough and had R sided calcified nodes at that time. May 2001 had CT showing splenic granuloma, granulomatous calcifications in R hilum and RLL. Had PPD+ as a child but repeats were (-).  Grew up in [place], thinks she may have had histoplasmosis (never sick, had a bird as a child).  Denies hx of TB exposure (her paternal grandmother died of TB but pt never met her).  She was seen in ID 10-01-12 for her granulomatous nodule. She had negative serologies for fungus, negative quantiferon gold and normal ACE level.  Has been feeling well. Has checked her results via MyChart and is aware of negative results.   Review of Systems  Constitutional: Negative for fever and chills.  Respiratory: Negative for cough and shortness of breath.        Has had localized pain in her R lower chest.        Objective:   Physical Exam  Constitutional: She appears well-developed and well-nourished.          Assessment & Plan:

## 2013-01-22 ENCOUNTER — Other Ambulatory Visit: Payer: Self-pay

## 2013-02-20 ENCOUNTER — Inpatient Hospital Stay (HOSPITAL_COMMUNITY): Payer: 59

## 2013-02-20 ENCOUNTER — Emergency Department (HOSPITAL_COMMUNITY): Payer: 59

## 2013-02-20 ENCOUNTER — Inpatient Hospital Stay (HOSPITAL_COMMUNITY)
Admission: EM | Admit: 2013-02-20 | Discharge: 2013-02-21 | DRG: 155 | Disposition: A | Discharge status: Home / Self Care | Payer: 59 | Attending: Internal Medicine | Admitting: Internal Medicine

## 2013-02-20 ENCOUNTER — Encounter (HOSPITAL_COMMUNITY): Payer: Self-pay

## 2013-02-20 DIAGNOSIS — S022XXB Fracture of nasal bones, initial encounter for open fracture: Secondary | ICD-10-CM

## 2013-02-20 DIAGNOSIS — M899 Disorder of bone, unspecified: Secondary | ICD-10-CM | POA: Diagnosis present

## 2013-02-20 DIAGNOSIS — W19XXXA Unspecified fall, initial encounter: Secondary | ICD-10-CM | POA: Diagnosis present

## 2013-02-20 DIAGNOSIS — Z8673 Personal history of transient ischemic attack (TIA), and cerebral infarction without residual deficits: Secondary | ICD-10-CM

## 2013-02-20 DIAGNOSIS — R197 Diarrhea, unspecified: Secondary | ICD-10-CM | POA: Diagnosis present

## 2013-02-20 DIAGNOSIS — R55 Syncope and collapse: Secondary | ICD-10-CM | POA: Diagnosis present

## 2013-02-20 DIAGNOSIS — M1990 Unspecified osteoarthritis, unspecified site: Secondary | ICD-10-CM | POA: Diagnosis present

## 2013-02-20 DIAGNOSIS — Z8544 Personal history of malignant neoplasm of other female genital organs: Secondary | ICD-10-CM

## 2013-02-20 DIAGNOSIS — S01511A Laceration without foreign body of lip, initial encounter: Secondary | ICD-10-CM

## 2013-02-20 DIAGNOSIS — E785 Hyperlipidemia, unspecified: Secondary | ICD-10-CM | POA: Diagnosis present

## 2013-02-20 DIAGNOSIS — S01501A Unspecified open wound of lip, initial encounter: Secondary | ICD-10-CM | POA: Diagnosis present

## 2013-02-20 DIAGNOSIS — Z86718 Personal history of other venous thrombosis and embolism: Secondary | ICD-10-CM

## 2013-02-20 DIAGNOSIS — Z87891 Personal history of nicotine dependence: Secondary | ICD-10-CM

## 2013-02-20 DIAGNOSIS — S12400A Unspecified displaced fracture of fifth cervical vertebra, initial encounter for closed fracture: Secondary | ICD-10-CM | POA: Diagnosis present

## 2013-02-20 DIAGNOSIS — A088 Other specified intestinal infections: Secondary | ICD-10-CM | POA: Diagnosis present

## 2013-02-20 DIAGNOSIS — I8390 Asymptomatic varicose veins of unspecified lower extremity: Secondary | ICD-10-CM | POA: Diagnosis present

## 2013-02-20 DIAGNOSIS — S0181XA Laceration without foreign body of other part of head, initial encounter: Secondary | ICD-10-CM

## 2013-02-20 DIAGNOSIS — S129XXA Fracture of neck, unspecified, initial encounter: Secondary | ICD-10-CM

## 2013-02-20 LAB — RAPID URINE DRUG SCREEN, HOSP PERFORMED
Amphetamines: NOT DETECTED
Barbiturates: NOT DETECTED
Opiates: POSITIVE — AB
Tetrahydrocannabinol: NOT DETECTED

## 2013-02-20 LAB — CBC WITH DIFFERENTIAL/PLATELET
Basophils Relative: 0 % (ref 0–1)
Eosinophils Absolute: 0.1 10*3/uL (ref 0.0–0.7)
Eosinophils Relative: 1 % (ref 0–5)
HCT: 35.7 % — ABNORMAL LOW (ref 36.0–46.0)
Hemoglobin: 12.3 g/dL (ref 12.0–15.0)
Lymphs Abs: 1.1 10*3/uL (ref 0.7–4.0)
MCH: 30 pg (ref 26.0–34.0)
MCHC: 34.5 g/dL (ref 30.0–36.0)
MCV: 87.1 fL (ref 78.0–100.0)
Monocytes Absolute: 0.7 10*3/uL (ref 0.1–1.0)
Monocytes Relative: 6 % (ref 3–12)
RBC: 4.1 MIL/uL (ref 3.87–5.11)

## 2013-02-20 LAB — POCT I-STAT, CHEM 8
BUN: 13 mg/dL (ref 6–23)
Calcium, Ion: 1.12 mmol/L — ABNORMAL LOW (ref 1.13–1.30)
Chloride: 107 mEq/L (ref 96–112)
Glucose, Bld: 114 mg/dL — ABNORMAL HIGH (ref 70–99)

## 2013-02-20 LAB — URINALYSIS, ROUTINE W REFLEX MICROSCOPIC
Bilirubin Urine: NEGATIVE
Glucose, UA: NEGATIVE mg/dL
Hgb urine dipstick: NEGATIVE
Protein, ur: NEGATIVE mg/dL
Urobilinogen, UA: 0.2 mg/dL (ref 0.0–1.0)

## 2013-02-20 MED ORDER — HYDROMORPHONE HCL PF 1 MG/ML IJ SOLN: 1.0000 mg | INTRAMUSCULAR | Status: DC | PRN

## 2013-02-20 MED ORDER — OXYCODONE HCL 5 MG PO TABS
5.0000 mg | ORAL_TABLET | ORAL | Status: DC | PRN
  Administered 2013-02-21: 5 mg via ORAL
  Filled 2013-02-20 (×2): qty 1

## 2013-02-20 MED ORDER — ONDANSETRON HCL 4 MG/2ML IJ SOLN
4.0000 mg | Freq: Four times a day (QID) | INTRAMUSCULAR | Status: DC | PRN
  Administered 2013-02-21: 4 mg via INTRAVENOUS
  Filled 2013-02-20 (×2): qty 2

## 2013-02-20 MED ORDER — SODIUM CHLORIDE 0.9 % IJ SOLN: 3.0000 mL | Freq: Two times a day (BID) | INTRAMUSCULAR | Status: DC

## 2013-02-20 MED ORDER — ONDANSETRON HCL 4 MG/2ML IJ SOLN
4.0000 mg | Freq: Once | INTRAMUSCULAR | Status: AC
  Administered 2013-02-20: 4 mg via INTRAVENOUS
  Filled 2013-02-20: qty 2

## 2013-02-20 MED ORDER — LIDOCAINE HCL 2 % IJ SOLN: 5.0000 mL | Freq: Once | INTRAMUSCULAR | Status: DC

## 2013-02-20 MED ORDER — ONDANSETRON HCL 4 MG/2ML IJ SOLN
4.0000 mg | Freq: Three times a day (TID) | INTRAMUSCULAR | Status: DC | PRN
  Administered 2013-02-20: 4 mg via INTRAVENOUS
  Filled 2013-02-20: qty 2

## 2013-02-20 MED ORDER — ACETAMINOPHEN 650 MG RE SUPP: 650.0000 mg | Freq: Four times a day (QID) | RECTAL | Status: DC | PRN

## 2013-02-20 MED ORDER — HYDROMORPHONE HCL PF 1 MG/ML IJ SOLN
1.0000 mg | INTRAMUSCULAR | Status: DC | PRN
  Administered 2013-02-20: 1 mg via INTRAVENOUS
  Filled 2013-02-20: qty 1

## 2013-02-20 MED ORDER — POTASSIUM CHLORIDE IN NACL 20-0.9 MEQ/L-% IV SOLN
INTRAVENOUS | Status: DC
  Administered 2013-02-20 – 2013-02-21 (×2): via INTRAVENOUS
  Filled 2013-02-20 (×5): qty 1000

## 2013-02-20 MED ORDER — IOHEXOL 350 MG/ML SOLN
100.0000 mL | Freq: Once | INTRAVENOUS | Status: AC | PRN
  Administered 2013-02-20: 100 mL via INTRAVENOUS

## 2013-02-20 MED ORDER — ONDANSETRON HCL 4 MG PO TABS: 4.0000 mg | ORAL_TABLET | Freq: Four times a day (QID) | ORAL | Status: DC | PRN

## 2013-02-20 MED ORDER — MORPHINE SULFATE 4 MG/ML IJ SOLN
4.0000 mg | Freq: Once | INTRAMUSCULAR | Status: AC
  Administered 2013-02-20: 4 mg via INTRAVENOUS
  Filled 2013-02-20: qty 1

## 2013-02-20 MED ORDER — SODIUM CHLORIDE 0.9 % IV BOLUS (SEPSIS)
1000.0000 mL | Freq: Once | INTRAVENOUS | Status: AC
  Administered 2013-02-20: 1000 mL via INTRAVENOUS

## 2013-02-20 MED ORDER — ACETAMINOPHEN 325 MG PO TABS
650.0000 mg | ORAL_TABLET | Freq: Four times a day (QID) | ORAL | Status: DC | PRN
  Administered 2013-02-20: 650 mg via ORAL
  Filled 2013-02-20: qty 2

## 2013-02-20 NOTE — ED Notes (Signed)
Dr. Lendell Caprice contacted to make aware pt was still in the ED. No further orders received from her at this time.

## 2013-02-20 NOTE — H&P (Addendum)
[facility] History and Physical  SHAWNE BULOW ZOX:[id number] DOB: 10/25/1945 DOA: 02/20/2013  Referring physician: EDP PCP: Londell Moh, MD   Chief Complaint: passed out  HPI: Sherry Barnes is a 67 y.o. female who presents to the ED after a syncopal episode. She woke up in the middle of the night with abdominal cramping. Subsequently she had multiple diarrheal stools. After several trips to the bathroom, she noted that she felt lightheaded. The next thing she remembers is waking up on the floor with epistaxis in a puddle of blood. She had a difficult time getting up but eventually woke her husband up and they summoned EMS. In the emergency room, she has had no further diarrhea. Her abdominal pain is better. She is found to have a C5 right lamina fracture extending into the spinous process and has been placed in a hard cervical spine collar. Dr. Venetia Maxon has been consulted and will see the patient in consultation. Also, she was found to have minimal nasal bone fractures bilaterally and had a laceration of the nose which has been sutured, also left upper liplaceration (steristripped). Currently, she has a headache in the frontal and occipital area, nose pain and neck pain. She feels slightly nauseated but thinks it may have been from pain medication. She had a syncopal episode in her 4s that was never worked up. She has had no significant chest pain, palpitations. She has a vague complaint of "breathing hard" which has lasted for an unknown amount of time. She has a history of DVT in the past after surgery and several plane trips. She has noticed that her left calf has been aching and she noted swelling of a varicose vein on her right leg. EKG and troponin are normal. White blood cell count is 12,000. She had a low risk nuclear stress test several years ago. She has had no sick contacts or recent travel. She and her husband both ate a salad last night. She has had no fevers or  chills.  Review of Systems: Systems reviewed and as above otherwise negative.  Past Medical History  Diagnosis Date  . Abdominal pain   . History of TIAs   . Osteoarthritis   . DVT of leg (deep venous thrombosis)     left  . History of UTI   . History of syncope     as a child only  . Osteopenia   . Lichen simplex     on scalp  . Hyperlipidemia     slight  . Rectal bleeding     seldom  . Incontinence   . History of blood clots   . Wears glasses   . Cancer     vulvar carcinoma in situ, resulting in vulvar excision  . Ventral hernia   . PONV (postoperative nausea and vomiting)   . Hemorrhoids   . Left arm pain    Past Surgical History  Procedure Laterality Date  . Appendectomy    . Bilateral ovarian dermoid cystectomy  04/06/1971  . Bladder repair      due to incontinence  . Condylectomy  06/16/1999    left foot, fifth toe  . Gynecologic cryosurgery  1995    for cervical dysplasia  . Transposition of ulnar nerve  02/24/2002    left elbow  . Cervical cone biopsy  05/15/1972  . Tubal ligation  12/22/1982    bilateral  . Abdominal hysterectomy  10/21/1996  . Colposcopy vulva  06/26/2007    wide local excision  of left vulva  . Hernia repair  01/27/2009    ventral umbilical   . Cholecystectomy  01/27/09  . Tonsillectomy    . Carpel tunnel      left   PT STATES SHE HAS NOT HAD CARPAL TUNNEL SURGERY  . Ventral hernia repair  05/02/2011    Procedure: LAPAROSCOPIC VENTRAL HERNIA;  Surgeon: Ernestene Mention, MD;  Location: [facility];  Service: General;  Laterality: N/A;  attempted Laparoscopic ventraL hernia repair with mesh, open ventral hernia repair    Social History:  reports that she quit smoking about 39 years ago. She has never used smokeless tobacco. She reports that she does not drink alcohol or use illicit drugs. she is married  No Known Allergies  Family History  Problem Relation Age of Onset  . Irritable bowel syndrome Mother   . Other Mother     bladder  infections, gallstones, low thyroid  . Heart disease Mother     3 vessel stints  . Emphysema Father   . Heart disease Father     congestive heart failure, stints place  . Other Sister     gallstones  . Other Brother     reflux, mitral valve prolapse  . Other Paternal Grandmother     TB   Prior to Admission medications   Medication Sig Start Date End Date Taking? Authorizing Provider  Calcium Carbonate-Vit D-Min (CALCIUM 1200) 1200-1000 MG-UNIT CHEW Chew 1 capsule by mouth daily.   Yes Historical Provider, MD  magnesium gluconate (MAGONATE) 500 MG tablet Take 250 mg by mouth daily.    Yes Historical Provider, MD  vitamin B-12 (CYANOCOBALAMIN) 500 MCG tablet Take 500 mcg by mouth daily.    Yes Historical Provider, MD  vitamin C (ASCORBIC ACID) 250 MG tablet Take 500 mg by mouth daily.    Yes Historical Provider, MD   Physical Exam: Filed Vitals:   02/20/13 1552  BP: 120/75  Pulse: 68  Temp:   Resp: 18   BP 120/75  Pulse 68  Temp(Src) 98.1 F (36.7 C) (Oral)  Resp 18  SpO2 100%  General Appearance:    Alert, cooperative, uncomfortable-appearing female   Head:    No ecchymoses or lacerations noted about the scalp or for head   Eyes:    PERRL, conjunctiva/corneas clear, EOM's intact, fundi    benign, both eyes  Ears:    Normal TM's and external ear canals, both ears  Nose:   dried blood in both nares noted. Swelling and tenderness over the bridge of the nose with a 1 cm laceration that has been sutured   Throat:   Lips, mucosa, and tongue normal; teeth and gums normal  Neck:   immobilized in a hard collar      Lungs:     Clear to auscultation bilaterally, respirations unlabored  Chest Wall:    No tenderness or deformity   Heart:    Regular rate and rhythm, S1 and S2 normal, no murmur, rub   or gallop     Abdomen:     Soft, non-tender, bowel sounds active all four quadrants,    no masses, no organomegaly  Genitalia:   deferred   Rectal:   deferred   Extremities:   left  leg with mildly positive Homans sign. No edema noted no cords or erythema. Right leg with varicose veins   Pulses:   2+ and symmetric all extremities  Skin:   Skin color, texture, turgor normal, no rashes or lesions  Lymph nodes:   Cervical, supraclavicular, and axillary nodes normal  Neurologic:   CNII-XII intact, normal strength, sensation and reflexes    throughout    psychiatric: Normal affect   Labs on Admission:  Basic Metabolic Panel:  Recent Labs Lab 02/20/13 0805  NA 142  K 3.8  CL 107  GLUCOSE 114*  BUN 13  CREATININE 0.70   Liver Function Tests: No results found for this basename: AST, ALT, ALKPHOS, BILITOT, PROT, ALBUMIN,  in the last 168 hours No results found for this basename: LIPASE, AMYLASE,  in the last 168 hours No results found for this basename: AMMONIA,  in the last 168 hours CBC:  Recent Labs Lab 02/20/13 0735 02/20/13 0805  WBC 12.0*  --   NEUTROABS 10.1*  --   HGB 12.3 12.2  HCT 35.7* 36.0  MCV 87.1  --   PLT 235  --    Cardiac Enzymes: No results found for this basename: CKTOTAL, CKMB, CKMBINDEX, TROPONINI,  in the last 168 hours  BNP (last 3 results) No results found for this basename: PROBNP,  in the last 8760 hours CBG:  Recent Labs Lab 02/20/13 0738  GLUCAP 100*    Radiological Exams on Admission: Dg Shoulder Right  02/20/2013   CLINICAL DATA:  Fall in bathroom. Right shoulder injury and pain.  EXAM: RIGHT SHOULDER - 2+ VIEW  COMPARISON:  None.  FINDINGS: No evidence of fracture or dislocation. No other significant bone abnormality identified.  IMPRESSION: Negative.   Electronically Signed   By: Myles Rosenthal   On: 02/20/2013 07:10   Ct Head Wo Contrast  02/20/2013   *RADIOLOGY REPORT*  Clinical Data:  Syncopal episode. Fell hitting face landing on left side.  Left-sided neck pain.  CT HEAD WITHOUT CONTRAST CT MAXILLOFACIAL WITHOUT CONTRAST CT CERVICAL SPINE WITHOUT CONTRAST  Technique:  Multidetector CT imaging of the head, cervical  spine, and maxillofacial structures were performed using the standard protocol without intravenous contrast. Multiplanar CT image reconstructions of the cervical spine and maxillofacial structures were also generated.  Comparison:   None  CT HEAD  Findings: No skull fracture or intracranial hemorrhage.  No CT evidence of large acute infarct.  No hydrocephalus.  No intracranial mass lesion detected on this unenhanced exam.  IMPRESSION: No skull fracture or intracranial hemorrhage.  CT MAXILLOFACIAL  Findings:  Minimal nasal bone fracture bilaterally with adjacent soft tissue injury otherwise no facial fracture noted.  Visualized intracranial structures and orbital structures unremarkable.  IMPRESSION: Minimal nasal bone fracture bilaterally with adjacent soft tissue injury otherwise no facial fracture noted.  CT CERVICAL SPINE  Findings:   Subtle nondisplaced fracture of the C5 right lamina extending into the spinous process.  Minimal ossification  anterior superior aspect of C6 probably related to degenerative changes rather than fracture.  If further delineation were clinically desired, MR imaging may then be considered.  Cervical spondylotic changes C4-5 through C6-7 with various degrees of spinal stenosis and foraminal narrowing.  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  Ectatic right carotid artery impresses upon the right posterior lateral aspect of the pharynx.  Nonspecific lung parenchymal changes.  Chronicity indeterminate.  IMPRESSION: Subtle nondisplaced fracture of the C5 right lamina extending into the spinous process.  Minimal ossification  anterior superior aspect of C6 probably related to degenerative changes rather than fracture.  If further delineation were clinically desired, MR imaging may then be considered.  Cervical spondylotic changes C4-5 through C6-7 with various degrees of spinal stenosis  and foraminal narrowing.  Nonspecific lung parenchymal changes.  Chronicity indeterminate.   Critical Value/emergent results were called by telephone at the time of interpretation on 03/02/2013 at 8:15 a.m. to Fayrene Helper physician's assistant, who verbally acknowledged these results.   Original Report Authenticated By: Lacy Duverney, M.D.   Ct Cervical Spine Wo Contrast  02/20/2013   *RADIOLOGY REPORT*  Clinical Data:  Syncopal episode. Fell hitting face landing on left side.  Left-sided neck pain.  CT HEAD WITHOUT CONTRAST CT MAXILLOFACIAL WITHOUT CONTRAST CT CERVICAL SPINE WITHOUT CONTRAST  Technique:  Multidetector CT imaging of the head, cervical spine, and maxillofacial structures were performed using the standard protocol without intravenous contrast. Multiplanar CT image reconstructions of the cervical spine and maxillofacial structures were also generated.  Comparison:   None  CT HEAD  Findings: No skull fracture or intracranial hemorrhage.  No CT evidence of large acute infarct.  No hydrocephalus.  No intracranial mass lesion detected on this unenhanced exam.  IMPRESSION: No skull fracture or intracranial hemorrhage.  CT MAXILLOFACIAL  Findings:  Minimal nasal bone fracture bilaterally with adjacent soft tissue injury otherwise no facial fracture noted.  Visualized intracranial structures and orbital structures unremarkable.  IMPRESSION: Minimal nasal bone fracture bilaterally with adjacent soft tissue injury otherwise no facial fracture noted.  CT CERVICAL SPINE  Findings:   Subtle nondisplaced fracture of the C5 right lamina extending into the spinous process.  Minimal ossification  anterior superior aspect of C6 probably related to degenerative changes rather than fracture.  If further delineation were clinically desired, MR imaging may then be considered.  Cervical spondylotic changes C4-5 through C6-7 with various degrees of spinal stenosis and foraminal narrowing.  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  Ectatic right carotid artery impresses upon the right posterior lateral aspect  of the pharynx.  Nonspecific lung parenchymal changes.  Chronicity indeterminate.  IMPRESSION: Subtle nondisplaced fracture of the C5 right lamina extending into the spinous process.  Minimal ossification  anterior superior aspect of C6 probably related to degenerative changes rather than fracture.  If further delineation were clinically desired, MR imaging may then be considered.  Cervical spondylotic changes C4-5 through C6-7 with various degrees of spinal stenosis and foraminal narrowing.  Nonspecific lung parenchymal changes.  Chronicity indeterminate.  Critical Value/emergent results were called by telephone at the time of interpretation on 03/02/2013 at 8:15 a.m. to Fayrene Helper physician's assistant, who verbally acknowledged these results.   Original Report Authenticated By: Lacy Duverney, M.D.   Ct Maxillofacial Wo Cm  02/20/2013   *RADIOLOGY REPORT*  Clinical Data:  Syncopal episode. Fell hitting face landing on left side.  Left-sided neck pain.  CT HEAD WITHOUT CONTRAST CT MAXILLOFACIAL WITHOUT CONTRAST CT CERVICAL SPINE WITHOUT CONTRAST  Technique:  Multidetector CT imaging of the head, cervical spine, and maxillofacial structures were performed using the standard protocol without intravenous contrast. Multiplanar CT image reconstructions of the cervical spine and maxillofacial structures were also generated.  Comparison:   None  CT HEAD  Findings: No skull fracture or intracranial hemorrhage.  No CT evidence of large acute infarct.  No hydrocephalus.  No intracranial mass lesion detected on this unenhanced exam.  IMPRESSION: No skull fracture or intracranial hemorrhage.  CT MAXILLOFACIAL  Findings:  Minimal nasal bone fracture bilaterally with adjacent soft tissue injury otherwise no facial fracture noted.  Visualized intracranial structures and orbital structures unremarkable.  IMPRESSION: Minimal nasal bone fracture bilaterally with adjacent soft tissue injury otherwise no facial fracture noted.  CT  CERVICAL SPINE  Findings:   Subtle nondisplaced fracture of the C5 right lamina extending into the spinous process.  Minimal ossification  anterior superior aspect of C6 probably related to degenerative changes rather than fracture.  If further delineation were clinically desired, MR imaging may then be considered.  Cervical spondylotic changes C4-5 through C6-7 with various degrees of spinal stenosis and foraminal narrowing.  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  Ectatic right carotid artery impresses upon the right posterior lateral aspect of the pharynx.  Nonspecific lung parenchymal changes.  Chronicity indeterminate.  IMPRESSION: Subtle nondisplaced fracture of the C5 right lamina extending into the spinous process.  Minimal ossification  anterior superior aspect of C6 probably related to degenerative changes rather than fracture.  If further delineation were clinically desired, MR imaging may then be considered.  Cervical spondylotic changes C4-5 through C6-7 with various degrees of spinal stenosis and foraminal narrowing.  Nonspecific lung parenchymal changes.  Chronicity indeterminate.  Critical Value/emergent results were called by telephone at the time of interpretation on 03/02/2013 at 8:15 a.m. to Fayrene Helper physician's assistant, who verbally acknowledged these results.   Original Report Authenticated By: Lacy Duverney, M.D.    EKG: NSR  Assessment/Plan    C5 laminar fracture:  Continue immobilization.  Await recs from Dr. Venetia Maxon.  Start diet if no surgical intervention.  PT/OT consult. Pain medications    Syncope: Suspect vasovagal +/- intravascular volume depletion after abdominal cramping and diarrhea.  Will continue IVF, monitor on telemetry. Check D dimer.  If positive, consider CTA chest to r/o PE. (has h/o DVT in the past and left leg has been hurting)    Diarrhea: likely self limited viral gastroenteritis, as she has had none further.  If any further loose stool, check for C  diff and fecal lactoferrin.    Open nasal fracture: sutured by EDP.  Ice, pain medications    Lip laceration    History of DVT (deep vein thrombosis): Patient reports having had left leg pain and noted that her right leg seemed red a few days ago. Will check Dopplers to rule out DVT.  Code Status: full Family Communication: husband at bedside Disposition Plan: home  Time spent: 70 minutes  Christiane Ha [facility] Pager [phone]  If 7PM-7AM, please contact night-coverage [email or web address] Password [email or web address] 02/20/2013, 4:10 PM

## 2013-02-20 NOTE — ED Notes (Signed)
Pt got up to use BR and had diarrhea and had sycopal episode. Pt fell and struck face on floor. EMS called and arrived and placed in c-collar  And started IV. EMS vital BP128/68 HR 72,. RR 18, SPO2 99 RA.

## 2013-02-20 NOTE — ED Provider Notes (Signed)
CSN: [id number]     Arrival date & time 02/20/13  0601 History   First MD Initiated Contact with Patient 02/20/13 [phone]     No chief complaint on file.  (Consider location/radiation/quality/duration/timing/severity/associated sxs/prior Treatment) The history is provided by the patient. No language interpreter was used.    67 year old female with hx of TIA, UTI, syncope, and cancer presents for evaluation of syncope.  Patient reports this morning she was experiencing abdominal cramping with set up. She went to the bathroom and had a bout of diarrhea. While on the commode patient felt lightheadedness and subsequently found herself laying on the floor next to the commode, with an apparent syncopal episode. She reported having a throbbing headache to the back of her head and also with neck pain. Pain is nonradiating but worsened with movement. She also experiencing a transient sharp left upper chest pain lasting only for a few seconds and resolved without treatment.  Patient reports pain to her face and noticed bleeding to her nose.  Denies taking blood thinner medication.  Also report having intermittent abdominal pain which started since yesterday.  States her abdominal pain has resolved. No associated fever, vision changes, dysuria, hematuria, hematochezia, melena or numbness.Sts she has and abdominal hernia, with surgical repair.  She was planning to visit her surgeon this week for reevaluation of her abd pain.  Pain is RLQ.  Currently report mild SOB, related to pain but sts she has prior hx of DVT and PE, not on blood thinner medication.  Pt also report taking herself off all of her meds a year ago "i just don't like to take medication".    Past Medical History  Diagnosis Date  . Abdominal pain   . History of TIAs   . Osteoarthritis   . DVT of leg (deep venous thrombosis)     left  . History of UTI   . History of syncope     as a child only  . Osteopenia   . Lichen simplex     on scalp  .  Hyperlipidemia     slight  . Rectal bleeding     seldom  . Incontinence   . History of blood clots   . Wears glasses   . Cancer     vulvar carcinoma in situ, resulting in vulvar excision  . Ventral hernia   . PONV (postoperative nausea and vomiting)   . Hemorrhoids   . Left arm pain    Past Surgical History  Procedure Laterality Date  . Appendectomy    . Bilateral ovarian dermoid cystectomy  04/06/1971  . Bladder repair      due to incontinence  . Condylectomy  06/16/1999    left foot, fifth toe  . Gynecologic cryosurgery  1995    for cervical dysplasia  . Transposition of ulnar nerve  02/24/2002    left elbow  . Cervical cone biopsy  05/15/1972  . Tubal ligation  12/22/1982    bilateral  . Abdominal hysterectomy  10/21/1996  . Colposcopy vulva  06/26/2007    wide local excision of left vulva  . Hernia repair  01/27/2009    ventral umbilical   . Cholecystectomy  01/27/09  . Tonsillectomy    . Carpel tunnel      left   PT STATES SHE HAS NOT HAD CARPAL TUNNEL SURGERY  . Ventral hernia repair  05/02/2011    Procedure: LAPAROSCOPIC VENTRAL HERNIA;  Surgeon: Ernestene Mention, MD;  Location: [facility];  Service: General;  Laterality: N/A;  attempted Laparoscopic ventraL hernia repair with mesh, open ventral hernia repair    Family History  Problem Relation Age of Onset  . Irritable bowel syndrome Mother   . Other Mother     bladder infections, gallstones, low thyroid  . Heart disease Mother     3 vessel stints  . Emphysema Father   . Heart disease Father     congestive heart failure, stints place  . Other Sister     gallstones  . Other Brother     reflux, mitral valve prolapse  . Other Paternal Grandmother     TB   History  Substance Use Topics  . Smoking status: Former Smoker    Quit date: 02/17/1974  . Smokeless tobacco: Never Used  . Alcohol Use: No   OB History   Grav Para Term Preterm Abortions TAB SAB Ect Mult Living                 Review of Systems   Constitutional: Negative for fever.  HENT: Positive for neck pain.   Neurological: Positive for headaches. Negative for numbness.  All other systems reviewed and are negative.    Allergies  Review of patient's allergies indicates no known allergies.  Home Medications   Current Outpatient Rx  Name  Route  Sig  Dispense  Refill  . Calcium Carbonate-Vit D-Min (CALCIUM 1200) 1200-1000 MG-UNIT CHEW   Oral   Chew 1 capsule by mouth daily.         . cholecalciferol (VITAMIN D) 1000 UNITS tablet   Oral   Take 2,000 Units by mouth daily. PT STATES SHE TAKES VITAMIN D3         . magnesium gluconate (MAGONATE) 500 MG tablet   Oral   Take 250 mg by mouth daily.          . vitamin B-12 (CYANOCOBALAMIN) 500 MCG tablet   Oral   Take 500 mcg by mouth daily.          . vitamin C (ASCORBIC ACID) 250 MG tablet   Oral   Take 500 mg by mouth daily.           There were no vitals taken for this visit. Physical Exam  Nursing note and vitals reviewed. Constitutional: She is oriented to person, place, and time. She appears well-developed and well-nourished. No distress.  Awake, alert, nontoxic appearance  HENT:  Head: Normocephalic.  Laceration noted to bridge of nose, no septal hematoma.  Dry blood around nose and mouth.  No malocclusion and no dental injury.  No hemotympanum.    Eyes: Conjunctivae and EOM are normal. Pupils are equal, round, and reactive to light. Right eye exhibits no discharge. Left eye exhibits no discharge.  Neck: Neck supple.  Wearing C-collar  Cardiovascular: Normal rate, regular rhythm and intact distal pulses.  Exam reveals no gallop and no friction rub.   No murmur heard. Pulmonary/Chest: Effort normal. No respiratory distress. She exhibits no tenderness.  No chest wall pain  Abdominal: Soft. There is no tenderness. There is no rebound.  No focal point tenderness.  Abdomen is soft.  No palpable pulsatile mass  Musculoskeletal: Normal range of motion.  She exhibits tenderness (tenderness to R shoulder at glenohumeral junction with decreased ROM due to pain, no obvious deformity.  ).  ROM appears intact, no obvious focal weakness  Tenderness to cervical midline without crepitus or stepoff.  Bilateral lower extremities without palpable cords, erythema, edema,  negative Homans sign.  Neurological: She is alert and oriented to person, place, and time.  Mental status and motor strength appears intact  5/5 strength to all 4 extremities.  No focal neuro deficits on initial exam.  Skin: No rash noted.  Psychiatric: She has a normal mood and affect.    ED Course  Procedures (including critical care time)   Date: 02/20/2013  Rate: 67  Rhythm: normal sinus rhythm  QRS Axis: normal  Intervals: normal  ST/T Wave abnormalities: normal  Conduction Disutrbances:none  Narrative Interpretation:   Old EKG Reviewed: unchanged    7:01 AM Patient here for evaluations of a syncopal episode. No seizure activity. Likely vasovagal. She does have a facial trauma noted with a lac to the bridge of nose. Will obtain maxillofacial, head and neck CT. She did report having abdominal pain with diarrhea. Abdomen is soft nontender on initial exam. Report having intermittent left leg pain with prior history of DVT. Leg is unremarkable on exam. Reports transient chest discomfort. Pain is atypical of cardiac disease. However will obtain EKG and troponin. Her heart and lung exams unremarkable. Plan for admission once work up done.  Care discussed with attending.  Pt may need further evaluation to r/o DVT/PE once admitted.    8:35 AM CT scan shows a nondisplaced nasal bones fracture. This is an open fracture as patient has an overlying laceration. No septal hematoma, no significant nasal deformity. Wound will be cleaned and suture as appropriate. CT scan also show a non-displaced C5 fracture from the lamina extending towards the spinous process. This corresponds with  patient's point tenderness.  Aspen Collar applied.  Pt tolerates well.  Will consult ortho.  Will also consult hospitalist for admission.   8:54 AM I have consulted [facility], Dr. Lendell Caprice who agrees to admit pt to Tele, Team 10, under her care.  i have also consulted neurosurgeon Dr. Venetia Maxon, who recommend for pt to remain on a hard collar, and he will see pt once he's out of the OR.  Pt is made aware of plan and agrees with plan.  She currently comfortable, no c/o cp, sob, abd pain and no focal neuro deficits.  Mentating appropriately.    LACERATION REPAIR Performed by: Fayrene Helper Authorized byFayrene Helper Consent: Verbal consent obtained. Risks and benefits: risks, benefits and alternatives were discussed Consent given by: patient Patient identity confirmed: provided demographic data Prepped and Draped in normal sterile fashion Wound explored  Laceration Location: bridge of nose  Laceration Length: 1cm  No Foreign Bodies seen or palpated  Anesthesia: local infiltration  Local anesthetic: lidocaine 2% w/o epinephrine  Anesthetic total: 1 ml  Irrigation method: syringe Amount of cleaning: standard  Skin closure: prolene 6.0  Number of sutures: 3  Technique: simple interrupted  Patient tolerance: Patient tolerated the procedure well with no immediate complications.  LACERATION REPAIR Performed by: Fayrene Helper Authorized byFayrene Helper Consent: Verbal consent obtained. Risks and benefits: risks, benefits and alternatives were discussed Consent given by: patient Patient identity confirmed: provided demographic data Prepped and Draped in normal sterile fashion Wound explored  Laceration Location: upper lip (does not cross vermillion border)  Laceration Length: 1cm  No Foreign Bodies seen or palpated  Anesthesia: none  Local anesthetic: none  Anesthetic total: none  Irrigation method: syringe Amount of cleaning: standard  Skin closure: sterile  strip  Number of sutures: sterile strip  Technique: sterile strip  Patient tolerance: Patient tolerated the procedure well with no immediate complications.    Labs  Review Labs Reviewed  CBC WITH DIFFERENTIAL - Abnormal; Notable for the following:    WBC 12.0 (*)    HCT 35.7 (*)    Neutrophils Relative % 84 (*)    Neutro Abs 10.1 (*)    Lymphocytes Relative 9 (*)    All other components within normal limits  GLUCOSE, CAPILLARY - Abnormal; Notable for the following:    Glucose-Capillary 100 (*)    All other components within normal limits  POCT I-STAT, CHEM 8 - Abnormal; Notable for the following:    Glucose, Bld 114 (*)    Calcium, Ion 1.12 (*)    All other components within normal limits  URINALYSIS, ROUTINE W REFLEX MICROSCOPIC  URINE RAPID DRUG SCREEN (HOSP PERFORMED)  POCT I-STAT TROPONIN I   Imaging Review Dg Shoulder Right  02/20/2013   CLINICAL DATA:  Fall in bathroom. Right shoulder injury and pain.  EXAM: RIGHT SHOULDER - 2+ VIEW  COMPARISON:  None.  FINDINGS: No evidence of fracture or dislocation. No other significant bone abnormality identified.  IMPRESSION: Negative.   Electronically Signed   By: Myles Rosenthal   On: 02/20/2013 07:10   Ct Head Wo Contrast  02/20/2013   *RADIOLOGY REPORT*  Clinical Data:  Syncopal episode. Fell hitting face landing on left side.  Left-sided neck pain.  CT HEAD WITHOUT CONTRAST CT MAXILLOFACIAL WITHOUT CONTRAST CT CERVICAL SPINE WITHOUT CONTRAST  Technique:  Multidetector CT imaging of the head, cervical spine, and maxillofacial structures were performed using the standard protocol without intravenous contrast. Multiplanar CT image reconstructions of the cervical spine and maxillofacial structures were also generated.  Comparison:   None  CT HEAD  Findings: No skull fracture or intracranial hemorrhage.  No CT evidence of large acute infarct.  No hydrocephalus.  No intracranial mass lesion detected on this unenhanced exam.  IMPRESSION: No skull  fracture or intracranial hemorrhage.  CT MAXILLOFACIAL  Findings:  Minimal nasal bone fracture bilaterally with adjacent soft tissue injury otherwise no facial fracture noted.  Visualized intracranial structures and orbital structures unremarkable.  IMPRESSION: Minimal nasal bone fracture bilaterally with adjacent soft tissue injury otherwise no facial fracture noted.  CT CERVICAL SPINE  Findings:   Subtle nondisplaced fracture of the C5 right lamina extending into the spinous process.  Minimal ossification  anterior superior aspect of C6 probably related to degenerative changes rather than fracture.  If further delineation were clinically desired, MR imaging may then be considered.  Cervical spondylotic changes C4-5 through C6-7 with various degrees of spinal stenosis and foraminal narrowing.  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  Ectatic right carotid artery impresses upon the right posterior lateral aspect of the pharynx.  Nonspecific lung parenchymal changes.  Chronicity indeterminate.  IMPRESSION: Subtle nondisplaced fracture of the C5 right lamina extending into the spinous process.  Minimal ossification  anterior superior aspect of C6 probably related to degenerative changes rather than fracture.  If further delineation were clinically desired, MR imaging may then be considered.  Cervical spondylotic changes C4-5 through C6-7 with various degrees of spinal stenosis and foraminal narrowing.  Nonspecific lung parenchymal changes.  Chronicity indeterminate.  Critical Value/emergent results were called by telephone at the time of interpretation on 03/02/2013 at 8:15 a.m. to Fayrene Helper physician's assistant, who verbally acknowledged these results.   Original Report Authenticated By: Lacy Duverney, M.D.   Ct Cervical Spine Wo Contrast  02/20/2013   *RADIOLOGY REPORT*  Clinical Data:  Syncopal episode. Fell hitting face landing on left side.  Left-sided neck pain.  CT HEAD WITHOUT CONTRAST CT  MAXILLOFACIAL WITHOUT CONTRAST CT CERVICAL SPINE WITHOUT CONTRAST  Technique:  Multidetector CT imaging of the head, cervical spine, and maxillofacial structures were performed using the standard protocol without intravenous contrast. Multiplanar CT image reconstructions of the cervical spine and maxillofacial structures were also generated.  Comparison:   None  CT HEAD  Findings: No skull fracture or intracranial hemorrhage.  No CT evidence of large acute infarct.  No hydrocephalus.  No intracranial mass lesion detected on this unenhanced exam.  IMPRESSION: No skull fracture or intracranial hemorrhage.  CT MAXILLOFACIAL  Findings:  Minimal nasal bone fracture bilaterally with adjacent soft tissue injury otherwise no facial fracture noted.  Visualized intracranial structures and orbital structures unremarkable.  IMPRESSION: Minimal nasal bone fracture bilaterally with adjacent soft tissue injury otherwise no facial fracture noted.  CT CERVICAL SPINE  Findings:   Subtle nondisplaced fracture of the C5 right lamina extending into the spinous process.  Minimal ossification  anterior superior aspect of C6 probably related to degenerative changes rather than fracture.  If further delineation were clinically desired, MR imaging may then be considered.  Cervical spondylotic changes C4-5 through C6-7 with various degrees of spinal stenosis and foraminal narrowing.  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  Ectatic right carotid artery impresses upon the right posterior lateral aspect of the pharynx.  Nonspecific lung parenchymal changes.  Chronicity indeterminate.  IMPRESSION: Subtle nondisplaced fracture of the C5 right lamina extending into the spinous process.  Minimal ossification  anterior superior aspect of C6 probably related to degenerative changes rather than fracture.  If further delineation were clinically desired, MR imaging may then be considered.  Cervical spondylotic changes C4-5 through C6-7 with  various degrees of spinal stenosis and foraminal narrowing.  Nonspecific lung parenchymal changes.  Chronicity indeterminate.  Critical Value/emergent results were called by telephone at the time of interpretation on 03/02/2013 at 8:15 a.m. to Fayrene Helper physician's assistant, who verbally acknowledged these results.   Original Report Authenticated By: Lacy Duverney, M.D.   Ct Maxillofacial Wo Cm  02/20/2013   *RADIOLOGY REPORT*  Clinical Data:  Syncopal episode. Fell hitting face landing on left side.  Left-sided neck pain.  CT HEAD WITHOUT CONTRAST CT MAXILLOFACIAL WITHOUT CONTRAST CT CERVICAL SPINE WITHOUT CONTRAST  Technique:  Multidetector CT imaging of the head, cervical spine, and maxillofacial structures were performed using the standard protocol without intravenous contrast. Multiplanar CT image reconstructions of the cervical spine and maxillofacial structures were also generated.  Comparison:   None  CT HEAD  Findings: No skull fracture or intracranial hemorrhage.  No CT evidence of large acute infarct.  No hydrocephalus.  No intracranial mass lesion detected on this unenhanced exam.  IMPRESSION: No skull fracture or intracranial hemorrhage.  CT MAXILLOFACIAL  Findings:  Minimal nasal bone fracture bilaterally with adjacent soft tissue injury otherwise no facial fracture noted.  Visualized intracranial structures and orbital structures unremarkable.  IMPRESSION: Minimal nasal bone fracture bilaterally with adjacent soft tissue injury otherwise no facial fracture noted.  CT CERVICAL SPINE  Findings:   Subtle nondisplaced fracture of the C5 right lamina extending into the spinous process.  Minimal ossification  anterior superior aspect of C6 probably related to degenerative changes rather than fracture.  If further delineation were clinically desired, MR imaging may then be considered.  Cervical spondylotic changes C4-5 through C6-7 with various degrees of spinal stenosis and foraminal narrowing.   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  Ectatic right carotid artery impresses upon the right posterior lateral aspect of the pharynx.  Nonspecific lung parenchymal changes.  Chronicity indeterminate.  IMPRESSION: Subtle nondisplaced fracture of the C5 right lamina extending into the spinous process.  Minimal ossification  anterior superior aspect of C6 probably related to degenerative changes rather than fracture.  If further delineation were clinically desired, MR imaging may then be considered.  Cervical spondylotic changes C4-5 through C6-7 with various degrees of spinal stenosis and foraminal narrowing.  Nonspecific lung parenchymal changes.  Chronicity indeterminate.  Critical Value/emergent results were called by telephone at the time of interpretation on 03/02/2013 at 8:15 a.m. to Fayrene Helper physician's assistant, who verbally acknowledged these results.   Original Report Authenticated By: Lacy Duverney, M.D.    MDM   1. Syncope and collapse   2. Neck fracture, initial encounter   3. Nasal bone fracture, open, initial encounter   4. Face lacerations, initial encounter    BP 133/75  Pulse 66  Resp 18  SpO2 95%  I have reviewed nursing notes and vital signs. I personally reviewed the imaging tests through PACS system  I reviewed available ER/hospitalization records thought the EMR     Fayrene Helper, PA-C 02/20/13 0932  Fayrene Helper, PA-C 02/20/13 [phone]

## 2013-02-20 NOTE — Consult Note (Addendum)
NEUROSURGERY CONSULT:  Sherry Barnes ZOX:[id number] DOB: [birth year] DOA: 02/20/2013  Referring physician: EDP  PCP: Londell Moh, MD  Chief Complaint: passed out  HPI: Sherry Barnes is a 67 y.o. female who presents to the ED after a syncopal episode. She woke up in the middle of the night with abdominal cramping. Subsequently she had multiple diarrheal stools. After several trips to the bathroom, she noted that she felt lightheaded. The next thing she remembers is waking up on the floor with epistaxis in a puddle of blood. She had a difficult time getting up but eventually woke her husband up and they summoned EMS. In the emergency room, she has had no further diarrhea. Her abdominal pain is better. She is found to have a C5 right lamina fracture extending into the spinous process and has been placed in a hard cervical spine collar. I have been consulted for this problem. Also, she was found to have minimal nasal bone fractures bilaterally and had a laceration of the nose which has been sutured and the left upper lip. Currently, she has a headache in the frontal and occipital area, nose pain and neck pain. She feels slightly nauseated but thinks it may have been from pain medication. She had a syncopal episode in her 64s that was never worked up. She has had no significant chest pain, palpitations. She has a vague complaint of "breathing hard" which has lasted for an unknown amount of time. She has a history of DVT in the past after surgery and several plane trips. She has noticed that her left calf has been aching and she noted swelling of a varicose vein on her right leg. EKG and troponin are normal. White blood cell count is 12,000. She had a low risk nuclear stress test several years ago. She has had no sick contacts or recent travel. She and her husband both ate a salad last night. She has had no fevers or chills.  Review of Systems: Systems reviewed and as above otherwise negative.  Past  Medical History   Diagnosis  Date   .  Abdominal pain    .  History of TIAs    .  Osteoarthritis    .  DVT of leg (deep venous thrombosis)      left   .  History of UTI    .  History of syncope      as a child only   .  Osteopenia    .  Lichen simplex      on scalp   .  Hyperlipidemia      slight   .  Rectal bleeding      seldom   .  Incontinence    .  History of blood clots    .  Wears glasses    .  Cancer      vulvar carcinoma in situ, resulting in vulvar excision   .  Ventral hernia    .  PONV (postoperative nausea and vomiting)    .  Hemorrhoids    .  Left arm pain     Past Surgical History   Procedure  Laterality  Date   .  Appendectomy     .  Bilateral ovarian dermoid cystectomy   04/06/1971   .  Bladder repair       due to incontinence   .  Condylectomy   06/16/1999     left foot, fifth toe   .  Gynecologic cryosurgery  1995     for cervical dysplasia   .  Transposition of ulnar nerve   02/24/2002     left elbow   .  Cervical cone biopsy   05/15/1972   .  Tubal ligation   12/22/1982     bilateral   .  Abdominal hysterectomy   10/21/1996   .  Colposcopy vulva   06/26/2007     wide local excision of left vulva   .  Hernia repair   01/27/2009     ventral umbilical   .  Cholecystectomy   01/27/09   .  Tonsillectomy     .  Carpel tunnel       left PT STATES SHE HAS NOT HAD CARPAL TUNNEL SURGERY   .  Ventral hernia repair   05/02/2011     Procedure: LAPAROSCOPIC VENTRAL HERNIA; Surgeon: Ernestene Mention, MD; Location: [facility]; Service: General; Laterality: N/A; attempted Laparoscopic ventraL hernia repair with mesh, open ventral hernia repair    Social History: reports that she quit smoking about 39 years ago. She has never used smokeless tobacco. She reports that she does not drink alcohol or use illicit drugs. she is married  No Known Allergies  Family History   Problem  Relation  Age of Onset   .  Irritable bowel syndrome  Mother    .  Other  Mother      bladder  infections, gallstones, low thyroid   .  Heart disease  Mother      3 vessel stints   .  Emphysema  Father    .  Heart disease  Father      congestive heart failure, stints place   .  Other  Sister      gallstones   .  Other  Brother      reflux, mitral valve prolapse   .  Other  Paternal Grandmother      TB    Prior to Admission medications   Medication  Sig  Start Date  End Date  Taking?  Authorizing Provider   Calcium Carbonate-Vit D-Min (CALCIUM 1200) 1200-1000 MG-UNIT CHEW  Chew 1 capsule by mouth daily.    Yes  Historical Provider, MD   magnesium gluconate (MAGONATE) 500 MG tablet  Take 250 mg by mouth daily.    Yes  Historical Provider, MD   vitamin B-12 (CYANOCOBALAMIN) 500 MCG tablet  Take 500 mcg by mouth daily.    Yes  Historical Provider, MD   vitamin C (ASCORBIC ACID) 250 MG tablet  Take 500 mg by mouth daily.    Yes  Historical Provider, MD   Physical Exam:  Filed Vitals:    02/20/13 1552   BP:  120/75   Pulse:  68   Temp:    Resp:  18    BP 120/75  Pulse 68  Temp(Src) 98.1 F (36.7 C) (Oral)  Resp 18  SpO2 100%  General Appearance:  Alert, cooperative, uncomfortable-appearing female   Head:  No ecchymoses or lacerations noted about the scalp or for head   Eyes:  PERRL, conjunctiva/corneas clear, EOM's intact, fundi  benign, both eyes   Ears:  Normal TM's and external ear canals, both ears   Nose:  dried blood in both nares noted. Swelling and tenderness over the bridge of the nose with a 1 cm laceration that has been sutured   Throat:  Lips, mucosa, and tongue normal; teeth and gums normal   Neck:  immobilized in  a hard collar      Lungs:  Clear to auscultation bilaterally, respirations unlabored   Chest Wall:  No tenderness or deformity   Heart:  Regular rate and rhythm, S1 and S2 normal, no murmur, rub  or gallop      Abdomen:  Soft, non-tender, bowel sounds active all four quadrants,  no masses, no organomegaly   Genitalia:  deferred   Rectal:   deferred   Extremities:  left leg with mildly positive Homans sign. No edema noted no cords or erythema. Right leg with varicose veins   Pulses:  2+ and symmetric all extremities   Skin:  Skin color, texture, turgor normal, no rashes or lesions   Lymph nodes:  Cervical, supraclavicular, and axillary nodes normal   Neurologic:  CNII-XII intact, normal strength, sensation and reflexes  throughout   psychiatric: Normal affect  Labs on Admission:  Basic Metabolic Panel:   Recent Labs  Lab  02/20/13 0805   NA  142   K  3.8   CL  107   GLUCOSE  114*   BUN  13   CREATININE  0.70    Liver Function Tests:  No results found for this basename: AST, ALT, ALKPHOS, BILITOT, PROT, ALBUMIN, in the last 168 hours  No results found for this basename: LIPASE, AMYLASE, in the last 168 hours  No results found for this basename: AMMONIA, in the last 168 hours  CBC:   Recent Labs  Lab  02/20/13 0735  02/20/13 0805   WBC  12.0*  --   NEUTROABS  10.1*  --   HGB  12.3  12.2   HCT  35.7*  36.0   MCV  87.1  --   PLT  235  --    Cardiac Enzymes:  No results found for this basename: CKTOTAL, CKMB, CKMBINDEX, TROPONINI, in the last 168 hours  BNP (last 3 results)  No results found for this basename: PROBNP, in the last 8760 hours  CBG:   Recent Labs  Lab  02/20/13 0738   GLUCAP  100*    Radiological Exams on Admission:  Dg Shoulder Right  02/20/2013 CLINICAL DATA: Fall in bathroom. Right shoulder injury and pain. EXAM: RIGHT SHOULDER - 2+ VIEW COMPARISON: None. FINDINGS: No evidence of fracture or dislocation. No other significant bone abnormality identified. IMPRESSION: Negative. Electronically Signed By: Myles Rosenthal On: 02/20/2013 07:10  Ct Head Wo Contrast  02/20/2013 *RADIOLOGY REPORT* Clinical Data: Syncopal episode. Fell hitting face landing on left side. Left-sided neck pain. CT HEAD WITHOUT CONTRAST CT MAXILLOFACIAL WITHOUT CONTRAST CT CERVICAL SPINE WITHOUT CONTRAST Technique: Multidetector  CT imaging of the head, cervical spine, and maxillofacial structures were performed using the standard protocol without intravenous contrast. Multiplanar CT image reconstructions of the cervical spine and maxillofacial structures were also generated. Comparison: None CT HEAD Findings: No skull fracture or intracranial hemorrhage. No CT evidence of large acute infarct. No hydrocephalus. No intracranial mass lesion detected on this unenhanced exam. IMPRESSION: No skull fracture or intracranial hemorrhage. CT MAXILLOFACIAL Findings: Minimal nasal bone fracture bilaterally with adjacent soft tissue injury otherwise no facial fracture noted. Visualized intracranial structures and orbital structures unremarkable. IMPRESSION: Minimal nasal bone fracture bilaterally with adjacent soft tissue injury otherwise no facial fracture noted. CT CERVICAL SPINE Findings: Subtle nondisplaced fracture of the C5 right lamina extending into the spinous process. Minimal ossification anterior superior aspect of C6 probably related to degenerative changes rather than fracture. If further delineation were clinically desired, MR imaging  may then be considered. Cervical spondylotic changes C4-5 through C6-7 with various degrees of spinal stenosis and foraminal narrowing. 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 Ectatic right carotid artery impresses upon the right posterior lateral aspect of the pharynx. Nonspecific lung parenchymal changes. Chronicity indeterminate. IMPRESSION: Subtle nondisplaced fracture of the C5 right lamina extending into the spinous process. Minimal ossification anterior superior aspect of C6 probably related to degenerative changes rather than fracture. If further delineation were clinically desired, MR imaging may then be considered. Cervical spondylotic changes C4-5 through C6-7 with various degrees of spinal stenosis and foraminal narrowing. Nonspecific lung parenchymal changes. Chronicity indeterminate.  Critical Value/emergent results were called by telephone at the time of interpretation on 03/02/2013 at 8:15 a.m. to Fayrene Helper physician's assistant, who verbally acknowledged these results. Original Report Authenticated By: Lacy Duverney, M.D.  Ct Cervical Spine Wo Contrast  02/20/2013 *RADIOLOGY REPORT* Clinical Data: Syncopal episode. Fell hitting face landing on left side. Left-sided neck pain. CT HEAD WITHOUT CONTRAST CT MAXILLOFACIAL WITHOUT CONTRAST CT CERVICAL SPINE WITHOUT CONTRAST Technique: Multidetector CT imaging of the head, cervical spine, and maxillofacial structures were performed using the standard protocol without intravenous contrast. Multiplanar CT image reconstructions of the cervical spine and maxillofacial structures were also generated. Comparison: None CT HEAD Findings: No skull fracture or intracranial hemorrhage. No CT evidence of large acute infarct. No hydrocephalus. No intracranial mass lesion detected on this unenhanced exam. IMPRESSION: No skull fracture or intracranial hemorrhage. CT MAXILLOFACIAL Findings: Minimal nasal bone fracture bilaterally with adjacent soft tissue injury otherwise no facial fracture noted. Visualized intracranial structures and orbital structures unremarkable. IMPRESSION: Minimal nasal bone fracture bilaterally with adjacent soft tissue injury otherwise no facial fracture noted. CT CERVICAL SPINE Findings: Subtle nondisplaced fracture of the C5 right lamina extending into the spinous process. Minimal ossification anterior superior aspect of C6 probably related to degenerative changes rather than fracture. If further delineation were clinically desired, MR imaging may then be considered. Cervical spondylotic changes C4-5 through C6-7 with various degrees of spinal stenosis and foraminal narrowing. 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 Ectatic right carotid artery impresses upon the right posterior lateral aspect of the pharynx. Nonspecific lung  parenchymal changes. Chronicity indeterminate. IMPRESSION: Subtle nondisplaced fracture of the C5 right lamina extending into the spinous process. Minimal ossification anterior superior aspect of C6 probably related to degenerative changes rather than fracture. If further delineation were clinically desired, MR imaging may then be considered. Cervical spondylotic changes C4-5 through C6-7 with various degrees of spinal stenosis and foraminal narrowing. Nonspecific lung parenchymal changes. Chronicity indeterminate. Critical Value/emergent results were called by telephone at the time of interpretation on 03/02/2013 at 8:15 a.m. to Fayrene Helper physician's assistant, who verbally acknowledged these results. Original Report Authenticated By: Lacy Duverney, M.D.  Ct Maxillofacial Wo Cm  02/20/2013 *RADIOLOGY REPORT* Clinical Data: Syncopal episode. Fell hitting face landing on left side. Left-sided neck pain. CT HEAD WITHOUT CONTRAST CT MAXILLOFACIAL WITHOUT CONTRAST CT CERVICAL SPINE WITHOUT CONTRAST Technique: Multidetector CT imaging of the head, cervical spine, and maxillofacial structures were performed using the standard protocol without intravenous contrast. Multiplanar CT image reconstructions of the cervical spine and maxillofacial structures were also generated. Comparison: None CT HEAD Findings: No skull fracture or intracranial hemorrhage. No CT evidence of large acute infarct. No hydrocephalus. No intracranial mass lesion detected on this unenhanced exam. IMPRESSION: No skull fracture or intracranial hemorrhage. CT MAXILLOFACIAL Findings: Minimal nasal bone fracture bilaterally with adjacent soft tissue injury otherwise no facial  fracture noted. Visualized intracranial structures and orbital structures unremarkable. IMPRESSION: Minimal nasal bone fracture bilaterally with adjacent soft tissue injury otherwise no facial fracture noted. CT CERVICAL SPINE Findings: Subtle nondisplaced fracture of the C5 right  lamina extending into the spinous process. Minimal ossification anterior superior aspect of C6 probably related to degenerative changes rather than fracture. If further delineation were clinically desired, MR imaging may then be considered. Cervical spondylotic changes C4-5 through C6-7 with various degrees of spinal stenosis and foraminal narrowing. 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 Ectatic right carotid artery impresses upon the right posterior lateral aspect of the pharynx. Nonspecific lung parenchymal changes. Chronicity indeterminate. IMPRESSION: Subtle nondisplaced fracture of the C5 right lamina extending into the spinous process. Minimal ossification anterior superior aspect of C6 probably related to degenerative changes rather than fracture. If further delineation were clinically desired, MR imaging may then be considered. Cervical spondylotic changes C4-5 through C6-7 with various degrees of spinal stenosis and foraminal narrowing. Nonspecific lung parenchymal changes. Chronicity indeterminate. Critical Value/emergent results were called by telephone at the time of interpretation on 03/02/2013 at 8:15 a.m. to Fayrene Helper physician's assistant, who verbally acknowledged these results. Original Report Authenticated By: Lacy Duverney, M.D.   EKG: NSR  Assessment/Plan  C5 laminar fracture: Continue immobilization in cervical collar for the next month.  Patient should follow up with me in office and if fracture healed, I will likely take her out of collar. OK to eat.  Agree with PT/OT consult. Pain medications, OK to D/C home when stable medically.  Will defer syncope workup to Dr. Lendell Caprice and Hospitalist team. Syncope: Suspect vasovagal +/- intravascular volume depletion after abdominal cramping and diarrhea. Will continue IVF, monitor on telemetry. Check D dimer. If positive, consider CTA chest to r/o PE. (has h/o DVT in the past and left leg has been hurting  Diarrhea: likely self limited  viral gastroenteritis, as she has had none further. If any further loose stool, check for C diff and fecal lactoferrin.  Open nasal fracture: sutured by EDP. Ice, pain medications  Lip laceration  History of DVT (deep vein thrombosis): Patient reports having had left leg pain and noted that her right leg seemed red a few days ago. Will check Dopplers to rule out DVT.  Code Status: full  Family Communication: husband at bedside  Disposition Plan: home   Danae Orleans. Venetia Maxon, MD

## 2013-02-20 NOTE — Progress Notes (Signed)
Patient with elevated D-dimer result.  Patient without any complaints of chest pain or SOB.  Notified Lenny Pastel NP on call for [facility], STAT CT angio of the chest ordered.  Results read, patient negative for pulmonary embolism.  Lenny Pastel NP aware.  Nursing will continue to monitor for any changes.

## 2013-02-21 ENCOUNTER — Encounter (HOSPITAL_COMMUNITY): Payer: Self-pay | Admitting: General Practice

## 2013-02-21 DIAGNOSIS — Z86718 Personal history of other venous thrombosis and embolism: Secondary | ICD-10-CM

## 2013-02-21 DIAGNOSIS — R569 Unspecified convulsions: Secondary | ICD-10-CM

## 2013-02-21 DIAGNOSIS — S129XXA Fracture of neck, unspecified, initial encounter: Secondary | ICD-10-CM

## 2013-02-21 DIAGNOSIS — S022XXB Fracture of nasal bones, initial encounter for open fracture: Secondary | ICD-10-CM

## 2013-02-21 LAB — CBC WITH DIFFERENTIAL/PLATELET
Basophils Absolute: 0 10*3/uL (ref 0.0–0.1)
Eosinophils Absolute: 0.1 10*3/uL (ref 0.0–0.7)
Lymphs Abs: 2 10*3/uL (ref 0.7–4.0)
MCH: 30.1 pg (ref 26.0–34.0)
Neutrophils Relative %: 68 % (ref 43–77)
Platelets: 251 10*3/uL (ref 150–400)
RBC: 3.96 MIL/uL (ref 3.87–5.11)
RDW: 13.9 % (ref 11.5–15.5)
WBC: 8.5 10*3/uL (ref 4.0–10.5)

## 2013-02-21 MED ORDER — OXYCODONE HCL 5 MG PO TABS: 5.0000 mg | ORAL_TABLET | Freq: Four times a day (QID) | ORAL | Status: DC | PRN

## 2013-02-21 NOTE — Evaluation (Addendum)
Occupational Therapy Evaluation and Discharge Patient Details Name: Sherry Barnes MRN: [id number] DOB: April 17, 1946 Today's Date: 02/21/2013 Time: 4098-1191 OT Time Calculation (min): 38 min  OT Assessment / Plan / Recommendation History of present illness Pt s/p syncopla episode with minimal nasal bone fracture bilaterally with adjacent soft tissue injury otherwise no facial fracture noted. Subtle nondisplaced fracture of the C5 right lamina extending into the spinous process--pt in c-collar.   Clinical Impression   This 67 yo female admitted with above presents to acute OT with all education completed. Have made nurse aware that pt either needs an extra set of pads for her collar or another collar for showering (small [place]). Acute OT will sign off. No PT needs identified, made PT aware.    OT Assessment  Patient does not need any further OT services    Follow Up Recommendations  No OT follow up       Equipment Recommendations  None recommended by OT          Precautions / Restrictions Precautions Precautions: Cervical;Fall Required Braces or Orthoses: Cervical Brace Cervical Brace: Hard collar;At all times Restrictions Weight Bearing Restrictions: No       ADL  Eating/Feeding: Modified independent Where Assessed - Eating/Feeding: Chair Grooming: Modified independent Where Assessed - Grooming: Unsupported standing Upper Body Bathing: Modified independent Where Assessed - Upper Body Bathing: Unsupported sit to stand Lower Body Bathing: Modified independent Where Assessed - Lower Body Bathing: Unsupported sit to stand Upper Body Dressing: Modified independent Where Assessed - Upper Body Dressing: Unsupported sit to stand Lower Body Dressing: Modified independent Where Assessed - Lower Body Dressing: Unsupported sit to stand Toilet Transfer: Independent Toilet Transfer Method: Sit to stand Toileting - Architect and Hygiene: Independent Where Assessed  - Engineer, mining and Hygiene: Sit to stand from 3-in-1 or toilet Equipment Used:  (c-collar) Transfers/Ambulation Related to ADLs: Independent--pt has been getting up to bathroom by herself ADL Comments: Can cross legs to get to feet. She can doff and don her own collar to change out/wash pads or change out to [place] shower collar        Visit Information  Last OT Received On: 02/21/13 Assistance Needed: +1 History of Present Illness: Pt s/p syncopla episode with minimal nasal bone fracture bilaterally with adjacent soft tissue injury otherwise no facial fracture noted. Subtle nondisplaced fracture of the C5 right lamina extending into the spinous process--pt in c-collar.       Prior Functioning     Home Living Family/patient expects to be discharged to:: Private residence Living Arrangements: Spouse/significant other Available Help at Discharge: Family;Available PRN/intermittently Type of Home: House Home Access: Level entry Home Layout: One level Home Equipment: None Prior Function Level of Independence: Independent Communication Communication: No difficulties Dominant Hand: Right         Vision/Perception Vision - History Patient Visual Report: No change from baseline   Cognition  Cognition Arousal/Alertness: Awake/alert Behavior During Therapy: WFL for tasks assessed/performed Overall Cognitive Status: Within Functional Limits for tasks assessed    Extremity/Trunk Assessment Upper Extremity Assessment Upper Extremity Assessment: Generalized weakness (mild pain Bil shoulders)     Mobility Bed Mobility Details for Bed Mobility Assistance: Pt up in recliner upon arrival Transfers Details for Transfer Assistance: Per nursing she has been getting up to bathroom by herself           End of Session OT - End of Session Equipment Utilized During Treatment: Cervical collar Activity Tolerance: Patient tolerated treatment well  Patient left:  in chair;with call bell/phone within reach;with family/visitor present       Evette Georges [phone] 02/21/2013, 11:55 AM

## 2013-02-21 NOTE — Progress Notes (Signed)
PT Cancellation Note  Patient Details Name: Sherry Barnes MRN: [id number] DOB: [birth year]   Cancelled Treatment:    Reason Eval/Treat Not Completed: Patient at procedure or test/unavailable.  Pt getting dopplers done downstairs.  PT/OT to check back later.     Rollene Rotunda [name], PT, DPT [phone]   02/21/2013, 9:30 AM

## 2013-02-21 NOTE — Progress Notes (Signed)
PT Cancellation Note  Patient Details Name: ROSI SECRIST MRN: [id number] DOB: Oct 20, 1945   Cancelled Treatment:    Reason Eval/Treat Not Completed: PT screened, no needs identified, will sign off.  OT completed all education, no acute PT needed at this time.  PT to sign off.   Rollene Rotunda Maxx Pham, PT, DPT [phone]   02/21/2013, 12:06 PM

## 2013-02-21 NOTE — Progress Notes (Signed)
Call to Dr Hulan Fess office  Patient has appointment on Monday 02/25/13 at 2:30 pm.

## 2013-02-21 NOTE — Progress Notes (Signed)
Patient provided with discharge instructions and follow up information. She was given cervical collar for showering at home. She is going home with her husband at this time.

## 2013-02-21 NOTE — Discharge Summary (Signed)
Physician Discharge Summary  Sherry Barnes ZOX:[id number] DOB: 11-26-1945 DOA: 02/20/2013  PCP: Londell Moh, MD  Admit date: 02/20/2013 Discharge date: 02/21/2013  Time spent: 35 minutes  Recommendations for Outpatient Follow-up:  1. Follow up with Dr Venetia Maxon in 4 weeks for evaluation Cervical fracture.  2. Follow up with PCP for suture remove.   Discharge Diagnoses:    C5 laminar fracture   Syncope   Open nasal fracture   Lip laceration   Diarrhea   History of DVT (deep vein thrombosis)   Discharge Condition: stable.   Diet recommendation: Regular diet.   Filed Weights   02/20/13 2300  Weight: 88.905 kg (196 lb)    History of present illness:  Sherry Barnes is a 67 y.o. female who presents to the ED after a syncopal episode. She woke up in the middle of the night with abdominal cramping. Subsequently she had multiple diarrheal stools. After several trips to the bathroom, she noted that she felt lightheaded. The next thing she remembers is waking up on the floor with epistaxis in a puddle of blood. She had a difficult time getting up but eventually woke her husband up and they summoned EMS. In the emergency room, she has had no further diarrhea. Her abdominal pain is better. She is found to have a C5 right lamina fracture extending into the spinous process and has been placed in a hard cervical spine collar. Dr. Venetia Maxon has been consulted and will see the patient in consultation. Also, she was found to have minimal nasal bone fractures bilaterally and had a laceration of the nose which has been sutured, also left upper liplaceration (steristripped). Currently, she has a headache in the frontal and occipital area, nose pain and neck pain. She feels slightly nauseated but thinks it may have been from pain medication. She had a syncopal episode in her 62s that was never worked up. She has had no significant chest pain, palpitations. She has a vague complaint of "breathing hard"  which has lasted for an unknown amount of time. She has a history of DVT in the past after surgery and several plane trips. She has noticed that her left calf has been aching and she noted swelling of a varicose vein on her right leg. EKG and troponin are normal. White blood cell count is 12,000. She had a low risk nuclear stress test several years ago. She has had no sick contacts or recent travel. She and her husband both ate a salad last night. She has had no fevers or chills.   Hospital Course:  C5 laminar fracture: evaluated by neurosurgery Dr Venetia Maxon who recommend collar and follow up in 4 weeks.   Diarrhea: likely self limited viral gastroenteritis. Resolved. No nausea, vomiting or diarrhea.  History of DVT; report left le pain, and swelling right LE. Doppler negative.  Syncope: Suspect vasovagal +/- intravascular volume depletion after abdominal cramping and diarrhea. Resolved. Received IV fluids.  Open nasal fracture: sutured by EDP. Ice, pain medications   Procedures:  Doppler LE; negative for DVT  Consultations:  Dr Venetia Maxon  Discharge Exam: Filed Vitals:   02/21/13 0530  BP: 142/67  Pulse: 82  Temp: 98.3 F (36.8 C)  Resp: 16    General: no distress.  Cardiovascular: S 1, S 2 RRR Respiratory: CTA  Discharge Instructions   Future Appointments Provider Department Dept Phone   02/27/2013 11:30 AM Ernestene Mention, MD [facility], [place] [phone]       Medication List  CALCIUM 1200 1200-1000 MG-UNIT Chew  Chew 1 capsule by mouth daily.     magnesium gluconate 500 MG tablet  Commonly known as:  MAGONATE  Take 250 mg by mouth daily.     oxyCODONE 5 MG immediate release tablet  Commonly known as:  Oxy IR/ROXICODONE  Take 1 tablet (5 mg total) by mouth every 6 (six) hours as needed.     vitamin B-12 500 MCG tablet  Commonly known as:  CYANOCOBALAMIN  Take 500 mcg by mouth daily.     vitamin C 250 MG tablet  Commonly known as:  ASCORBIC  ACID  Take 500 mg by mouth daily.       No Known Allergies     Follow-up Information   Follow up with Londell Moh, MD. (Monday 02/25/13 at 2:30 pm)    Specialty:  Internal Medicine   Contact information:   [address] [place] [address] [phone]        The results of significant diagnostics from this hospitalization (including imaging, microbiology, ancillary and laboratory) are listed below for reference.    Significant Diagnostic Studies: Dg Shoulder Right  02/20/2013   CLINICAL DATA:  Fall in bathroom. Right shoulder injury and pain.  EXAM: RIGHT SHOULDER - 2+ VIEW  COMPARISON:  None.  FINDINGS: No evidence of fracture or dislocation. No other significant bone abnormality identified.  IMPRESSION: Negative.   Electronically Signed   By: Myles Rosenthal   On: 02/20/2013 07:10   Ct Head Wo Contrast  02/20/2013   *RADIOLOGY REPORT*  Clinical Data:  Syncopal episode. Fell hitting face landing on left side.  Left-sided neck pain.  CT HEAD WITHOUT CONTRAST CT MAXILLOFACIAL WITHOUT CONTRAST CT CERVICAL SPINE WITHOUT CONTRAST  Technique:  Multidetector CT imaging of the head, cervical spine, and maxillofacial structures were performed using the standard protocol without intravenous contrast. Multiplanar CT image reconstructions of the cervical spine and maxillofacial structures were also generated.  Comparison:   None  CT HEAD  Findings: No skull fracture or intracranial hemorrhage.  No CT evidence of large acute infarct.  No hydrocephalus.  No intracranial mass lesion detected on this unenhanced exam.  IMPRESSION: No skull fracture or intracranial hemorrhage.  CT MAXILLOFACIAL  Findings:  Minimal nasal bone fracture bilaterally with adjacent soft tissue injury otherwise no facial fracture noted.  Visualized intracranial structures and orbital structures unremarkable.  IMPRESSION: Minimal nasal bone fracture bilaterally with adjacent soft tissue injury otherwise no  facial fracture noted.  CT CERVICAL SPINE  Findings:   Subtle nondisplaced fracture of the C5 right lamina extending into the spinous process.  Minimal ossification  anterior superior aspect of C6 probably related to degenerative changes rather than fracture.  If further delineation were clinically desired, MR imaging may then be considered.  Cervical spondylotic changes C4-5 through C6-7 with various degrees of spinal stenosis and foraminal narrowing.  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  Ectatic right carotid artery impresses upon the right posterior lateral aspect of the pharynx.  Nonspecific lung parenchymal changes.  Chronicity indeterminate.  IMPRESSION: Subtle nondisplaced fracture of the C5 right lamina extending into the spinous process.  Minimal ossification  anterior superior aspect of C6 probably related to degenerative changes rather than fracture.  If further delineation were clinically desired, MR imaging may then be considered.  Cervical spondylotic changes C4-5 through C6-7 with various degrees of spinal stenosis and foraminal narrowing.  Nonspecific lung parenchymal changes.  Chronicity indeterminate.  Critical Value/emergent results were  called by telephone at the time of interpretation on 03/02/2013 at 8:15 a.m. to Fayrene Helper physician's assistant, who verbally acknowledged these results.   Original Report Authenticated By: Lacy Duverney, M.D.   Ct Angio Chest Pe W/cm &/or Wo Cm  02/20/2013   *RADIOLOGY REPORT*  Clinical Data: Elevated D-dimer.Wheezy.  CT ANGIOGRAPHY CHEST  Technique:  Multidetector CT imaging of the chest using the standard protocol during bolus administration of intravenous contrast. Multiplanar reconstructed images including MIPs were obtained and reviewed to evaluate the vascular anatomy.  Contrast: OMNIPAQUE IOHEXOL 350 MG/ML SOLN  Comparison: None.  Findings:  THORACIC INLET/BODY WALL:  No acute abnormality.  MEDIASTINUM:  Normal heart size.  No  pericardial effusion.  No acute vascular abnormality.  Calcified right mediastinal and hilar lymph nodes compatible with previous granulomatous infection.  LUNG WINDOWS:  No consolidation.  No effusion.  No suspicious pulmonary nodule. Calcified right lower lobe pulmonary nodule. Patchy ground-glass attenuation in the lungs is likely from air trapping, especially given mild bronchial wall thickening.  UPPER ABDOMEN:  Numerous low density circumscribed lesions throughout the liver which appear benign and are grossly stable from abdominal imaging 03/21/2011.  In the high right lobe liver there is a non water density lesion measuring a centimeter, stable from prior, likely representing hemangioma based on prior study.  OSSEOUS:  No acute fracture.  No suspicious lytic or blastic lesions.  IMPRESSION:  1.  Negative for pulmonary embolism.  2.  Patchy bilateral air trapping, often from reactive airways disease.   Original Report Authenticated By: Tiburcio Pea   Ct Cervical Spine Wo Contrast  02/20/2013   *RADIOLOGY REPORT*  Clinical Data:  Syncopal episode. Fell hitting face landing on left side.  Left-sided neck pain.  CT HEAD WITHOUT CONTRAST CT MAXILLOFACIAL WITHOUT CONTRAST CT CERVICAL SPINE WITHOUT CONTRAST  Technique:  Multidetector CT imaging of the head, cervical spine, and maxillofacial structures were performed using the standard protocol without intravenous contrast. Multiplanar CT image reconstructions of the cervical spine and maxillofacial structures were also generated.  Comparison:   None  CT HEAD  Findings: No skull fracture or intracranial hemorrhage.  No CT evidence of large acute infarct.  No hydrocephalus.  No intracranial mass lesion detected on this unenhanced exam.  IMPRESSION: No skull fracture or intracranial hemorrhage.  CT MAXILLOFACIAL  Findings:  Minimal nasal bone fracture bilaterally with adjacent soft tissue injury otherwise no facial fracture noted.  Visualized intracranial  structures and orbital structures unremarkable.  IMPRESSION: Minimal nasal bone fracture bilaterally with adjacent soft tissue injury otherwise no facial fracture noted.  CT CERVICAL SPINE  Findings:   Subtle nondisplaced fracture of the C5 right lamina extending into the spinous process.  Minimal ossification  anterior superior aspect of C6 probably related to degenerative changes rather than fracture.  If further delineation were clinically desired, MR imaging may then be considered.  Cervical spondylotic changes C4-5 through C6-7 with various degrees of spinal stenosis and foraminal narrowing.  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  Ectatic right carotid artery impresses upon the right posterior lateral aspect of the pharynx.  Nonspecific lung parenchymal changes.  Chronicity indeterminate.  IMPRESSION: Subtle nondisplaced fracture of the C5 right lamina extending into the spinous process.  Minimal ossification  anterior superior aspect of C6 probably related to degenerative changes rather than fracture.  If further delineation were clinically desired, MR imaging may then be considered.  Cervical spondylotic changes C4-5 through C6-7 with various degrees of spinal stenosis and  foraminal narrowing.  Nonspecific lung parenchymal changes.  Chronicity indeterminate.  Critical Value/emergent results were called by telephone at the time of interpretation on 03/02/2013 at 8:15 a.m. to Fayrene Helper physician's assistant, who verbally acknowledged these results.   Original Report Authenticated By: Lacy Duverney, M.D.   Ct Maxillofacial Wo Cm  02/20/2013   *RADIOLOGY REPORT*  Clinical Data:  Syncopal episode. Fell hitting face landing on left side.  Left-sided neck pain.  CT HEAD WITHOUT CONTRAST CT MAXILLOFACIAL WITHOUT CONTRAST CT CERVICAL SPINE WITHOUT CONTRAST  Technique:  Multidetector CT imaging of the head, cervical spine, and maxillofacial structures were performed using the standard protocol without  intravenous contrast. Multiplanar CT image reconstructions of the cervical spine and maxillofacial structures were also generated.  Comparison:   None  CT HEAD  Findings: No skull fracture or intracranial hemorrhage.  No CT evidence of large acute infarct.  No hydrocephalus.  No intracranial mass lesion detected on this unenhanced exam.  IMPRESSION: No skull fracture or intracranial hemorrhage.  CT MAXILLOFACIAL  Findings:  Minimal nasal bone fracture bilaterally with adjacent soft tissue injury otherwise no facial fracture noted.  Visualized intracranial structures and orbital structures unremarkable.  IMPRESSION: Minimal nasal bone fracture bilaterally with adjacent soft tissue injury otherwise no facial fracture noted.  CT CERVICAL SPINE  Findings:   Subtle nondisplaced fracture of the C5 right lamina extending into the spinous process.  Minimal ossification  anterior superior aspect of C6 probably related to degenerative changes rather than fracture.  If further delineation were clinically desired, MR imaging may then be considered.  Cervical spondylotic changes C4-5 through C6-7 with various degrees of spinal stenosis and foraminal narrowing.  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  Ectatic right carotid artery impresses upon the right posterior lateral aspect of the pharynx.  Nonspecific lung parenchymal changes.  Chronicity indeterminate.  IMPRESSION: Subtle nondisplaced fracture of the C5 right lamina extending into the spinous process.  Minimal ossification  anterior superior aspect of C6 probably related to degenerative changes rather than fracture.  If further delineation were clinically desired, MR imaging may then be considered.  Cervical spondylotic changes C4-5 through C6-7 with various degrees of spinal stenosis and foraminal narrowing.  Nonspecific lung parenchymal changes.  Chronicity indeterminate.  Critical Value/emergent results were called by telephone at the time of interpretation on  03/02/2013 at 8:15 a.m. to Fayrene Helper physician's assistant, who verbally acknowledged these results.   Original Report Authenticated By: Lacy Duverney, M.D.    Microbiology: No results found for this or any previous visit (from the past 240 hour(s)).   Labs: Basic Metabolic Panel:  Recent Labs Lab 02/20/13 0805  NA 142  K 3.8  CL 107  GLUCOSE 114*  BUN 13  CREATININE 0.70   Liver Function Tests: No results found for this basename: AST, ALT, ALKPHOS, BILITOT, PROT, ALBUMIN,  in the last 168 hours No results found for this basename: LIPASE, AMYLASE,  in the last 168 hours No results found for this basename: AMMONIA,  in the last 168 hours CBC:  Recent Labs Lab 02/20/13 0735 02/20/13 0805 02/21/13 0540  WBC 12.0*  --  8.5  NEUTROABS 10.1*  --  5.8  HGB 12.3 12.2 11.9*  HCT 35.7* 36.0 35.0*  MCV 87.1  --  88.4  PLT 235  --  251   Cardiac Enzymes: No results found for this basename: CKTOTAL, CKMB, CKMBINDEX, TROPONINI,  in the last 168 hours BNP: BNP (last 3 results) No results found  for this basename: PROBNP,  in the last 8760 hours CBG:  Recent Labs Lab 02/20/13 0738  GLUCAP 100*       Signed:  Brittiny Levitz  [facility] 02/21/2013, 12:12 PM

## 2013-02-21 NOTE — Progress Notes (Signed)
VASCULAR LAB PRELIMINARY  PRELIMINARY  PRELIMINARY  PRELIMINARY  Bilateral lower extremity venous duplex completed.    Preliminary report:  Bilateral:  No evidence of DVT, superficial thrombosis, or Baker's Cyst.   Crystel Demarco, RVS 02/21/2013, 10:12 AM

## 2013-02-21 NOTE — ED Provider Notes (Signed)
Medical screening examination/treatment/procedure(s) were conducted as a shared visit with non-physician practitioner(s) and myself.  I personally evaluated the patient during the encounter and agree with plan of care and physical exam.  Patient is a 67 year old female with a history of cancer, prior TIAs who presents emergency department with a syncopal episode and diarrhea. She is neurologically intact. No complaint of chest pain but does have mild shortness of breath. Patient is currently in a c-collar. She has a laceration to the bridge of her nose. Will obtain cardiac workup, CT of her face, head and cervical spine. Patient will need admission.  Layla Maw Braxson Hollingsworth, DO 02/21/13 [phone]

## 2013-02-21 NOTE — Progress Notes (Signed)
Orthopedic Tech Progress Note Patient Details:  Sherry Barnes 01/06/1946 [id number] [place] collar delivered to room Ortho Devices Type of Ortho Device: [place] cervical collar Ortho Device/Splint Interventions: Ordered   Asia R Thompson 02/21/2013, 1:02 PM

## 2013-02-27 ENCOUNTER — Ambulatory Visit (INDEPENDENT_AMBULATORY_CARE_PROVIDER_SITE_OTHER): Payer: PRIVATE HEALTH INSURANCE | Admitting: General Surgery

## 2013-04-24 ENCOUNTER — Institutional Professional Consult (permissible substitution): Payer: Medicare Other | Admitting: Pulmonary Disease

## 2013-04-24 ENCOUNTER — Other Ambulatory Visit: Payer: Self-pay

## 2013-12-04 ENCOUNTER — Encounter: Payer: Self-pay | Admitting: Neurology

## 2013-12-04 ENCOUNTER — Ambulatory Visit (INDEPENDENT_AMBULATORY_CARE_PROVIDER_SITE_OTHER): Payer: PRIVATE HEALTH INSURANCE | Admitting: Neurology

## 2013-12-04 VITALS — BP 135/87 | HR 67 | Ht 62.5 in | Wt 198.0 lb

## 2013-12-04 DIAGNOSIS — IMO0002 Reserved for concepts with insufficient information to code with codable children: Secondary | ICD-10-CM

## 2013-12-04 DIAGNOSIS — R209 Unspecified disturbances of skin sensation: Secondary | ICD-10-CM

## 2013-12-04 DIAGNOSIS — S86911A Strain of unspecified muscle(s) and tendon(s) at lower leg level, right leg, initial encounter: Secondary | ICD-10-CM

## 2013-12-04 DIAGNOSIS — R200 Anesthesia of skin: Secondary | ICD-10-CM

## 2013-12-04 NOTE — Progress Notes (Signed)
PATIENT: Sherry Barnes DOB: 24-Jun-1945  HISTORICAL  BRETTE CAST is a 68 year old right-handed Caucasian female, referred by her primary care physician Dr. Shelia Media for evaluation of right toe numbness  She had right knee injury in Nov 12 2013, while traveling at [place],, taking a bus, she bumped her right knee, suffered right knee cap Pain, couple days later, she noticed right third, fourth, fifth toe numbness,Which has been persistent since its onset,  She denies significant low back pain, no weakness, no gait difficulty,  She has a history of C5 nondisplaced fracture, following a fall accident in September 2014,  REVIEW OF SYSTEMS: Full 14 system review of systems performed and notable only for  fatigue, swelling in legs, shortness of breath, energy, numbness, passing out, insomnia, too much sleep, decreased energy  ALLERGIES: No Known Allergies  HOME MEDICATIONS: Current Outpatient Prescriptions on File Prior to Visit  Medication Sig Dispense Refill  . Calcium Carbonate-Vit D-Min (CALCIUM 1200) 1200-1000 MG-UNIT CHEW Chew 1 capsule by mouth daily.      . magnesium gluconate (MAGONATE) 500 MG tablet Take 250 mg by mouth daily.       Marland Kitchen oxyCODONE (OXY IR/ROXICODONE) 5 MG immediate release tablet Take 1 tablet (5 mg total) by mouth every 6 (six) hours as needed.  30 tablet  0  . vitamin B-12 (CYANOCOBALAMIN) 500 MCG tablet Take 500 mcg by mouth daily.       . vitamin C (ASCORBIC ACID) 250 MG tablet Take 500 mg by mouth daily.          PAST MEDICAL HISTORY: Past Medical History  Diagnosis Date  . Abdominal pain   . History of TIAs   . Osteoarthritis   . DVT of leg (deep venous thrombosis)     left  . History of UTI   . History of syncope     as a child only  . Osteopenia   . Lichen simplex     on scalp  . Hyperlipidemia     slight  . Rectal bleeding     seldom  . Incontinence   . History of blood clots   . Wears glasses   . Cancer     vulvar carcinoma in  situ, resulting in vulvar excision  . Ventral hernia   . PONV (postoperative nausea and vomiting)   . Hemorrhoids   . Left arm pain   . Syncope and collapse 02/21/2013  . Swollen ankles   . Strain of right knee   . Numbness of foot     right   . Right leg numbness   . Neck pain     PAST SURGICAL HISTORY: Past Surgical History  Procedure Laterality Date  . Appendectomy    . Bilateral ovarian dermoid cystectomy  04/06/1971  . Bladder repair      due to incontinence  . Condylectomy  06/16/1999    left foot, fifth toe  . Gynecologic cryosurgery  1995    for cervical dysplasia  . Transposition of ulnar nerve  02/24/2002    left elbow  . Cervical cone biopsy  05/15/1972  . Tubal ligation  12/22/1982    bilateral  . Abdominal hysterectomy  10/21/1996  . Colposcopy vulva  06/26/2007    wide local excision of left vulva  . Hernia repair  1/96/2229    ventral umbilical   . Cholecystectomy  01/27/09  . Tonsillectomy    . Ventral hernia repair  05/02/2011    Procedure: LAPAROSCOPIC  VENTRAL HERNIA;  Surgeon: Adin Hector, MD;  Location: [facility];  Service: General;  Laterality: N/A;  attempted Laparoscopic ventraL hernia repair with mesh, open ventral hernia repair   . Left cubital tunnel  2003    FAMILY HISTORY: Family History  Problem Relation Age of Onset  . Irritable bowel syndrome Mother   . Other Mother     bladder infections, gallstones, low thyroid  . Heart disease Mother     3 vessel stints  . Hyperthyroidism Mother   . Emphysema Father   . Heart disease Father     congestive heart failure, stints place  . Other Sister     gallstones  . Other Brother     reflux, mitral valve prolapse  . Other Paternal Grandmother     TB  . Hyperthyroidism Sister   . Hyperparathyroidism Maternal Uncle     SOCIAL HISTORY:  History   Social History  . Marital Status: Married    Spouse Name: N/A    Number of Children: 2  . Years of Education: N/A   Occupational History  .  Retired Scientist, clinical (histocompatibility and immunogenetics)   Social History Main Topics  . Smoking status: Former Smoker    Quit date: 02/18/1972  . Smokeless tobacco: Never Used  . Alcohol Use: 0.6 oz/week    1 Glasses of wine per week  . Drug Use: No  . Sexual Activity: Not on file   Other Topics Concern  . Not on file   Social History Narrative  . No narrative on file    PHYSICAL EXAM   There were no vitals filed for this visit.  Not recorded    There is no weight on file to calculate BMI.   Generalized: In no acute distress  Neck: Supple, no carotid bruits   Cardiac: Regular rate rhythm  Pulmonary: Clear to auscultation bilaterally  Musculoskeletal: No deformity  Neurological examination  Mentation: Alert oriented to time, place, history taking, and causual conversation  Cranial nerve II-XII: Pupils were equal round reactive to light. Extraocular movements were full.  Visual field were full on confrontational test. Bilateral fundi were sharp.  Facial sensation and strength were normal. Hearing was intact to finger rubbing bilaterally. Uvula tongue midline.  Head turning and shoulder shrug and were normal and symmetric.Tongue protrusion into cheek strength was normal.  Motor: Normal tone, bulk and strength.  Sensory: Intact to fine touch, pinprick, preserved vibratory sensation, and proprioception at toes.  Coordination: Normal finger to nose, heel-to-shin bilaterally there was no truncal ataxia  Gait: Rising up from seated position without assistance, normal stance, without trunk ataxia, moderate stride, good arm swing, smooth turning, able to perform tiptoe, and heel walking without difficulty.   Romberg signs: Negative  Deep tendon reflexes: Brachioradialis 2/2, biceps 2/2, triceps 2/2, patellar 2/2, Achilles 2/2, plantar responses were flexor bilaterally.   DIAGNOSTIC DATA (LABS, IMAGING, TESTING) - I reviewed patient records, labs, notes, testing and imaging myself where available.  Lab Results   Component Value Date   WBC 8.5 02/21/2013   HGB 11.9* 02/21/2013   HCT 35.0* 02/21/2013   MCV 88.4 02/21/2013   PLT 251 02/21/2013      Component Value Date/Time   NA 142 02/20/2013 0805   K 3.8 02/20/2013 0805   CL 107 02/20/2013 0805   CO2 27 04/26/2011 1435   GLUCOSE 114* 02/20/2013 0805   BUN 13 02/20/2013 0805   CREATININE 0.70 02/20/2013 0805   CALCIUM 9.9 04/26/2011 1435   PROT  7.0 04/26/2011 1435   ALBUMIN 3.7 04/26/2011 1435   AST 18 04/26/2011 1435   ALT 17 04/26/2011 1435   ALKPHOS 90 04/26/2011 1435   BILITOT 0.2* 04/26/2011 1435   GFRNONAA >90 05/02/2011 1437   GFRAA >90 05/02/2011 1437   ASSESSMENT AND PLAN  CARMITA BOOM is a 68 y.o. female complains of  right lateral toes paresthesia, differentiation diagnosis including right superficial peroneal neuropathy, vs. right sciatic neuropathy, right lumbar radiculopathy  She is overall only mildly symptomatic, I have suggested her continue observe in her symptoms, if she remained symptomatic, she make contact our office for EMG nerve conduction study   Marcial Pacas, M.D. Ph.D.  [facility] [address], [place], [place] [address] [phone]

## 2014-05-22 ENCOUNTER — Institutional Professional Consult (permissible substitution): Payer: Medicare Other | Admitting: Emergency Medicine

## 2014-06-30 ENCOUNTER — Ambulatory Visit: Payer: 59 | Admitting: Emergency Medicine

## 2014-06-30 ENCOUNTER — Encounter: Payer: Self-pay | Admitting: Emergency Medicine

## 2014-06-30 VITALS — BP 128/86 | HR 90 | Ht 63.0 in | Wt 201.0 lb

## 2014-06-30 DIAGNOSIS — J8410 Pulmonary fibrosis, unspecified: Secondary | ICD-10-CM

## 2014-06-30 DIAGNOSIS — R0600 Dyspnea, unspecified: Secondary | ICD-10-CM

## 2014-06-30 NOTE — Assessment & Plan Note (Signed)
   We will repeat your CT scan of the chest to compare with 2014 We will perform full pulmonary function testing Follow with Dr Lamonte Sakai next available after your studies.

## 2014-06-30 NOTE — Progress Notes (Signed)
Subjective:    Patient ID: Sherry Barnes, female    DOB: 1945/11/09, 69 y.o.   MRN: [id number]  HPI 69 yo woman, former mild smoker, hx of DVT, OA, TIA, prior episodes syncope. She is referred for abnormal CT scan and CXR with R hilar calcified nodes, some B LL GGI, last characterized on CT from 02/20/13. She follows w Dr Shelia Media and had a CXR more recently that confirmed these findings. She tells me that she has more exertional SOB over the last 2 years. No cough, CP, wheeze.    Review of Systems  Constitutional: Positive for unexpected weight change. Negative for fever.  HENT: Negative for congestion, dental problem, ear pain, nosebleeds, postnasal drip, rhinorrhea, sinus pressure, sneezing, sore throat and trouble swallowing.   Eyes: Negative for redness and itching.  Respiratory: Positive for cough and shortness of breath. Negative for chest tightness and wheezing.   Cardiovascular: Positive for chest pain. Negative for palpitations and leg swelling.  Gastrointestinal: Positive for abdominal pain. Negative for nausea and vomiting.  Genitourinary: Negative for dysuria.  Musculoskeletal: Negative for joint swelling.  Skin: Negative for rash.  Neurological: Positive for headaches.  Hematological: Does not bruise/bleed easily.  Psychiatric/Behavioral: Negative for dysphoric mood. The patient is not nervous/anxious.     Past Medical History  Diagnosis Date  . Abdominal pain   . History of TIAs   . Osteoarthritis   . DVT of leg (deep venous thrombosis)     left  . History of UTI   . History of syncope     as a child only  . Osteopenia   . Lichen simplex     on scalp  . Hyperlipidemia     slight  . Rectal bleeding     seldom  . Incontinence   . History of blood clots   . Wears glasses   . Cancer     vulvar carcinoma in situ, resulting in vulvar excision  . Ventral hernia   . PONV (postoperative nausea and vomiting)   . Hemorrhoids   . Left arm pain   . Syncope and  collapse 02/21/2013  . Swollen ankles   . Strain of right knee   . Numbness of foot     right   . Right leg numbness   . Neck pain      Family History  Problem Relation Age of Onset  . Irritable bowel syndrome Mother   . Other Mother     bladder infections, gallstones, low thyroid  . Heart disease Mother     3 vessel stints  . Hyperthyroidism Mother   . Emphysema Father   . Heart disease Father     congestive heart failure, stints place  . Other Sister     gallstones  . Other Brother     reflux, mitral valve prolapse  . Other Paternal Grandmother     TB  . Hyperthyroidism Sister   . Hyperparathyroidism Maternal Uncle      History   Social History  . Marital Status: Married    Spouse Name: N/A    Number of Children: N/A  . Years of Education: N/A   Occupational History  . Not on file.   Social History Main Topics  . Smoking status: Former Smoker -- 1.00 packs/day for 10 years    Types: Cigarettes    Quit date: 02/17/1974  . Smokeless tobacco: Never Used  . Alcohol Use: 0.6 oz/week    1 Glasses of wine  per week  . Drug Use: No  . Sexual Activity: Not on file   Other Topics Concern  . 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 Has also lived in [place], [place], [place], [place].    No Known Allergies   Outpatient Prescriptions Prior to Visit  Medication Sig Dispense Refill  . BIOTIN PO Take by mouth daily.    . Probiotic Product (PROBIOTIC DAILY PO) Take by mouth daily.    . vitamin B-12 (CYANOCOBALAMIN) 500 MCG tablet Take 500 mcg by mouth daily.     . vitamin C (ASCORBIC ACID) 250 MG tablet Take 500 mg by mouth daily.     . Calcium Carbonate-Vit D-Min (CALCIUM 1200) 1200-1000 MG-UNIT CHEW Chew 1 capsule by mouth daily.    . magnesium gluconate (MAGONATE) 500 MG tablet Take 250 mg by mouth daily.      No facility-administered medications prior to visit.         Objective:   Physical  Exam Filed Vitals:   06/30/14 1616 06/30/14 1617  BP:  128/86  Pulse:  90  Height: 5\' 3"  (1.6 m)   Weight: 201 lb (91.173 kg)   SpO2:  98%   Gen: Pleasant, well-nourished, in no distress,  normal affect  ENT: No lesions,  mouth clear,  oropharynx clear, no postnasal drip  Neck: No JVD, no TMG, no carotid bruits  Lungs: No use of accessory muscles, no dullness to percussion, clear without rales or rhonchi  Cardiovascular: RRR, heart sounds normal, no murmur or gallops, no peripheral edema  Musculoskeletal: No deformities, no cyanosis or clubbing  Neuro: alert, non focal  Skin: Warm, no lesions or rashes      Assessment & Plan:  No problem-specific assessment & plan notes found for this encounter.

## 2014-06-30 NOTE — Patient Instructions (Signed)
We will repeat your CT scan of the chest to compare with 2014 We will perform full pulmonary function testing Follow with Dr Lamonte Sakai next available after your studies.

## 2014-07-01 ENCOUNTER — Ambulatory Visit (INDEPENDENT_AMBULATORY_CARE_PROVIDER_SITE_OTHER): Payer: 59 | Admitting: Emergency Medicine

## 2014-07-01 ENCOUNTER — Ambulatory Visit (INDEPENDENT_AMBULATORY_CARE_PROVIDER_SITE_OTHER)
Admission: RE | Admit: 2014-07-01 | Discharge: 2014-07-01 | Disposition: A | Discharge status: Home / Self Care | Payer: 59 | Source: Ambulatory Visit | Attending: Emergency Medicine | Admitting: Emergency Medicine

## 2014-07-01 DIAGNOSIS — R0600 Dyspnea, unspecified: Secondary | ICD-10-CM

## 2014-07-01 DIAGNOSIS — J479 Bronchiectasis, uncomplicated: Secondary | ICD-10-CM | POA: Diagnosis not present

## 2014-07-01 DIAGNOSIS — J8410 Pulmonary fibrosis, unspecified: Secondary | ICD-10-CM | POA: Diagnosis not present

## 2014-07-01 LAB — PULMONARY FUNCTION TEST
DL/VA % pred: 101 %
DL/VA: 4.68 ml/min/mmHg/L
DLCO unc % pred: 98 %
DLCO unc: 21.94 ml/min/mmHg
FEF 25-75 Post: 2.61 L/sec
FEF 25-75 Pre: 3.04 L/sec
FEF2575-%Change-Post: -14 %
FEF2575-%Pred-Post: 135 %
FEF2575-%Pred-Pre: 157 %
FEV1-%Change-Post: -2 %
FEV1-%Pred-Post: 119 %
FEV1-%Pred-Pre: 122 %
FEV1-Post: 2.62 L
FEV1-Pre: 2.69 L
FEV1FVC-%Change-Post: 0 %
FEV1FVC-%Pred-Pre: 110 %
FEV6-%Change-Post: -3 %
FEV6-%Pred-Post: 111 %
FEV6-%Pred-Pre: 115 %
FEV6-Post: 3.09 L
FEV6-Pre: 3.19 L
FEV6FVC-%Pred-Post: 104 %
FEV6FVC-%Pred-Pre: 104 %
FVC-%Change-Post: -3 %
FVC-%Pred-Post: 106 %
FVC-%Pred-Pre: 110 %
FVC-Post: 3.09 L
FVC-Pre: 3.2 L
Post FEV1/FVC ratio: 85 %
Post FEV6/FVC ratio: 100 %
Pre FEV1/FVC ratio: 84 %
Pre FEV6/FVC Ratio: 100 %
RV % pred: 75 %
RV: 1.56 L
TLC % pred: 99 %
TLC: 4.82 L

## 2014-07-01 NOTE — Progress Notes (Signed)
PFT done today. 

## 2014-07-02 ENCOUNTER — Encounter: Payer: Self-pay | Admitting: Emergency Medicine

## 2014-07-02 ENCOUNTER — Ambulatory Visit (INDEPENDENT_AMBULATORY_CARE_PROVIDER_SITE_OTHER): Payer: 59 | Admitting: Emergency Medicine

## 2014-07-02 VITALS — BP 112/74 | HR 75 | Ht 63.0 in | Wt 205.0 lb

## 2014-07-02 DIAGNOSIS — J984 Other disorders of lung: Secondary | ICD-10-CM

## 2014-07-02 DIAGNOSIS — J8410 Pulmonary fibrosis, unspecified: Secondary | ICD-10-CM

## 2014-07-02 NOTE — Patient Instructions (Signed)
Your pulmonary function testing is normal. There is no evidence of asthma or COPD or scarring.  Your CT scan of the chest shows your old stable R hilar granuloma with calcium. There is no other abnormality on your scan. Follow with Dr Lamonte Sakai as needed.

## 2014-07-02 NOTE — Assessment & Plan Note (Signed)
Stable right hilar calcified node is almost certainly due to old histoplasmosis or granulomatous lung disease. Was some evidence on an old CT scan of possible groundglass or air trapping. The new CT scan does not show any evidence of groundglass. Her PFT are normal without any evidence of obstructive lung disease. I do not believe she needs any other workup unless she clinically changes. She will follow-up as needed

## 2014-07-02 NOTE — Progress Notes (Signed)
Subjective:    Patient ID: Sherry Barnes, female    DOB: [birth year], 69 y.o.   MRN: [id number]  Shortness of Breath Associated symptoms include abdominal pain, chest pain and headaches. Pertinent negatives include no ear pain, fever, leg swelling, rash, rhinorrhea, sore throat, vomiting or wheezing.   69 yo woman, former mild smoker, hx of DVT, OA, TIA, prior episodes syncope. She is referred for abnormal CT scan and CXR with R hilar calcified nodes, some B LL GGI, last characterized on CT from 02/20/13. She follows w Dr Shelia Media and had a CXR more recently that confirmed these findings. She tells me that she has more exertional SOB over the last 2 years. No cough, CP, wheeze.   ROV 07/02/14 -- follow up visit for right hilar lymphadenopathy with calcification and some bilateral scattered groundglass infiltrate concerning for possible inflammation versus air trapping. Her last CT scan was 02/20/13. We just repeated her scan 07/01/14. Her groundglass infiltrates have completely resolved she does still have her right hilar calcified nodule. Her PFTs done 07/01/14 are normal without any obstruction.    Review of Systems  Constitutional: Positive for unexpected weight change. Negative for fever.  HENT: Negative for congestion, dental problem, ear pain, nosebleeds, postnasal drip, rhinorrhea, sinus pressure, sneezing, sore throat and trouble swallowing.   Eyes: Negative for redness and itching.  Respiratory: Positive for cough and shortness of breath. Negative for chest tightness and wheezing.   Cardiovascular: Positive for chest pain. Negative for palpitations and leg swelling.  Gastrointestinal: Positive for abdominal pain. Negative for nausea and vomiting.  Genitourinary: Negative for dysuria.  Musculoskeletal: Negative for joint swelling.  Skin: Negative for rash.  Neurological: Positive for headaches.  Hematological: Does not bruise/bleed easily.  Psychiatric/Behavioral: Negative for dysphoric  mood. The patient is not nervous/anxious.     Past Medical History  Diagnosis Date  . Abdominal pain   . History of TIAs   . Osteoarthritis   . DVT of leg (deep venous thrombosis)     left  . History of UTI   . History of syncope     as a child only  . Osteopenia   . Lichen simplex     on scalp  . Hyperlipidemia     slight  . Rectal bleeding     seldom  . Incontinence   . History of blood clots   . Wears glasses   . Cancer     vulvar carcinoma in situ, resulting in vulvar excision  . Ventral hernia   . PONV (postoperative nausea and vomiting)   . Hemorrhoids   . Left arm pain   . Syncope and collapse 02/21/2013  . Swollen ankles   . Strain of right knee   . Numbness of foot     right   . Right leg numbness   . Neck pain      Family History  Problem Relation Age of Onset  . Irritable bowel syndrome Mother   . Other Mother     bladder infections, gallstones, low thyroid  . Heart disease Mother     3 vessel stints  . Hyperthyroidism Mother   . Emphysema Father   . Heart disease Father     congestive heart failure, stints place  . Other Sister     gallstones  . Other Brother     reflux, mitral valve prolapse  . Other Paternal Grandmother     TB  . Hyperthyroidism Sister   . Hyperparathyroidism Maternal  Uncle      History   Social History  . Marital Status: Married    Spouse Name: N/A    Number of Children: N/A  . Years of Education: N/A   Occupational History  . Not on file.   Social History Main Topics  . Smoking status: Former Smoker -- 1.00 packs/day for 10 years    Types: Cigarettes    Quit date: 02/17/1974  . Smokeless tobacco: Never Used  . Alcohol Use: 0.6 oz/week    1 Glasses of wine per week  . Drug Use: No  . Sexual Activity: Not on file   Other Topics Concern  . 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 Has also lived in [place], [place], [place], [place].    No  Known Allergies   Outpatient Prescriptions Prior to Visit  Medication Sig Dispense Refill  . BIOTIN PO Take 1,000 mcg by mouth daily.     . Probiotic Product (PROBIOTIC DAILY PO) Take by mouth daily.    . vitamin B-12 (CYANOCOBALAMIN) 500 MCG tablet Take 500 mcg by mouth daily.     . vitamin C (ASCORBIC ACID) 250 MG tablet Take 500 mg by mouth daily.      No facility-administered medications prior to visit.         Objective:   Physical Exam Filed Vitals:   07/02/14 1426  BP: 112/74  Pulse: 75  Height: 5\' 3"  (1.6 m)  Weight: 205 lb (92.987 kg)  SpO2: 100%   Gen: Pleasant, well-nourished, in no distress,  normal affect  ENT: No lesions,  mouth clear,  oropharynx clear, no postnasal drip  Neck: No JVD, no TMG, no carotid bruits  Lungs: No use of accessory muscles, no dullness to percussion, clear without rales or rhonchi  Cardiovascular: RRR, heart sounds normal, no murmur or gallops, no peripheral edema  Musculoskeletal: No deformities, no cyanosis or clubbing  Neuro: alert, non focal  Skin: Warm, no lesions or rashes      Assessment & Plan:  Granulomatous lung disease Stable right hilar calcified node is almost certainly due to old histoplasmosis or granulomatous lung disease. Was some evidence on an old CT scan of possible groundglass or air trapping. The new CT scan does not show any evidence of groundglass. Her PFT are normal without any evidence of obstructive lung disease. I do not believe she needs any other workup unless she clinically changes. She will follow-up as needed

## 2014-07-10 ENCOUNTER — Ambulatory Visit: Payer: Medicare Other | Admitting: Cardiology

## 2014-09-24 ENCOUNTER — Ambulatory Visit: Payer: Medicare Other | Admitting: Cardiology

## 2014-09-24 ENCOUNTER — Ambulatory Visit: Payer: Self-pay | Admitting: Orthopedic Surgery

## 2014-09-24 NOTE — Progress Notes (Signed)
Preoperative surgical orders have been place into the [facility] hospital system for Sherry Barnes on 09/24/2014, 4:12 PM  by Mickel Crow for surgery on 10-07-2014.  Preop Knee Scope orders including IV Tylenol and IV Decadron as long as there are no contraindications to the above medications. Arlee Muslim, PA-C

## 2014-09-30 ENCOUNTER — Other Ambulatory Visit: Payer: Self-pay | Admitting: Obstetrics and Gynecology

## 2014-10-01 ENCOUNTER — Encounter (HOSPITAL_BASED_OUTPATIENT_CLINIC_OR_DEPARTMENT_OTHER): Payer: Self-pay | Admitting: *Deleted

## 2014-10-01 NOTE — Progress Notes (Signed)
NPO AFTER MN.  ARRIVE AT 0830.  NEEDS HG. 

## 2014-10-06 DIAGNOSIS — S83249A Other tear of medial meniscus, current injury, unspecified knee, initial encounter: Secondary | ICD-10-CM | POA: Diagnosis present

## 2014-10-06 LAB — CYTOLOGY - PAP

## 2014-10-06 NOTE — H&P (Signed)
CC- Sherry Barnes is a 69 y.o. female who presents with left knee pain.  HPI- . Knee Pain: Patient presents with knee pain involving the  left knee. Onset of the symptoms was several months ago. Inciting event: none known. Current symptoms include giving out, pain located medially and stiffness. Pain is aggravated by going up and down stairs, lateral movements, pivoting, rising after sitting and walking.  Patient has had no prior knee problems. Evaluation to date: MRI: abnormal medial meniscal tear. Treatment to date: rest.  Past Medical History  Diagnosis Date  . Osteopenia   . Wears glasses   . PONV (postoperative nausea and vomiting)   . History of DVT of lower extremity     behind left knee--  2011  resolved  . Granulomatous lung disease     PULMOLOGIST--  DR Lamonte Sakai--  PFT'S NORMAL  . Osteoarthritis   . Acute meniscal tear of left knee   . History of vulvar dysplasia   . Arthralgia of left elbow   . History of cervical fracture     C5  laminar fx non-displace 02-21-2013 no surgical intervention  . GERD (gastroesophageal reflux disease)   . Wears glasses     Past Surgical History  Procedure Laterality Date  . Gynecologic cryosurgery  1995     cervical dysplasia  . Cervical cone biopsy  05/15/1972  . Cholecystectomy  01/27/09  . Ventral hernia repair  05/02/2011    Procedure: LAPAROSCOPIC VENTRAL HERNIA;  Surgeon: Adin Hector, [place];  Location: [facility];  Service: General;  Laterality: N/A;  attempted Laparoscopic ventraL hernia repair with mesh, open ventral hernia repair   . Cardiovascular stress test  08-26-2010   dr Shanon Brow harding    Low Risk perfusion scan/  observed defect is consistant with diaphragmatic attenuation, no significant wall motion abnormalities,  ef 78%  , small area mild reversible perfusion defect in the RCA territory (per cardiologist false positive)  . Tonsillectomy and adenoidectomy  1970  . Neuroplasty / transposition ulnar nerve at elbow Left  02-24-2002  . Tubal ligation  12-22-1982  . Umbilical hernia repair  01-27-2009  . Laparotomy w/ bilateral ovarian dermoid cystectomy  04-06-1971    and APPENDECTOMY  . Condylectomy left fifth toe  06-16-1999  . Wide local excision left vulva  06-26-2007  . Transthoracic echocardiogram  04-22-2008    normal /   ef 60-65%/  mild TR  . Abdominal hysterectomy  10-21-1996    AND BLADDER SLING    Prior to Admission medications   Medication Sig Start Date End Date Taking? Authorizing Provider  acetaminophen (TYLENOL) 500 MG tablet Take 1,000 mg by mouth every 6 (six) hours as needed.   Yes Historical Provider, [place]  BIOTIN PO Take 1,000 mcg by mouth daily.    Yes Historical Provider, [place]  Cholecalciferol (VITAMIN D3) 1000 UNITS CAPS Take 1 capsule by mouth daily.   Yes Historical Provider, MD  Probiotic Product (PROBIOTIC DAILY PO) Take 1 tablet by mouth daily.    Yes Historical Provider, MD  vitamin B-12 (CYANOCOBALAMIN) 1000 MCG tablet Take 1,000 mcg by mouth daily.   Yes Historical Provider, MD  vitamin C (ASCORBIC ACID) 500 MG tablet Take 500 mg by mouth daily.   Yes Historical Provider, [place]   KNEE EXAM antalgic gait, soft tissue tenderness over medial joint line, no effusion, negative drawer sign, collateral ligaments intact  Physical Examination: General appearance - alert, well appearing, and in no distress Mental status - alert,  oriented to person, place, and time Chest - clear to auscultation, no wheezes, rales or rhonchi, symmetric air entry Heart - normal rate, regular rhythm, normal S1, S2, no murmurs, rubs, clicks or gallops Abdomen - soft, nontender, nondistended, no masses or organomegaly Neurological - alert, oriented, normal speech, no focal findings or movement disorder noted   Asessment/Plan--- Left knee medial meniscal tear- - Plan left knee arthroscopy with meniscal debridement. Procedure risks and potential comps discussed with patient who elects to proceed. Goals are  decreased pain and increased function with a high likelihood of achieving both

## 2014-10-07 ENCOUNTER — Ambulatory Visit (HOSPITAL_COMMUNITY): Admission: RE | Admit: 2014-10-07 | Payer: Medicare Other | Source: Ambulatory Visit | Admitting: Orthopedic Surgery

## 2014-10-07 ENCOUNTER — Encounter (HOSPITAL_COMMUNITY)
Admission: RE | Disposition: A | Discharge status: Home / Self Care | Payer: Self-pay | Source: Ambulatory Visit | Attending: Orthopedic Surgery

## 2014-10-07 ENCOUNTER — Ambulatory Visit (HOSPITAL_COMMUNITY): Payer: 59 | Admitting: Anesthesiology

## 2014-10-07 ENCOUNTER — Encounter (HOSPITAL_BASED_OUTPATIENT_CLINIC_OR_DEPARTMENT_OTHER): Payer: Self-pay

## 2014-10-07 ENCOUNTER — Ambulatory Visit (HOSPITAL_BASED_OUTPATIENT_CLINIC_OR_DEPARTMENT_OTHER)
Admission: RE | Admit: 2014-10-07 | Discharge: 2014-10-07 | Disposition: A | Discharge status: Home / Self Care | Payer: 59 | Source: Ambulatory Visit | Attending: Orthopedic Surgery | Admitting: Orthopedic Surgery

## 2014-10-07 DIAGNOSIS — J8410 Pulmonary fibrosis, unspecified: Secondary | ICD-10-CM | POA: Diagnosis not present

## 2014-10-07 DIAGNOSIS — Z86718 Personal history of other venous thrombosis and embolism: Secondary | ICD-10-CM | POA: Diagnosis not present

## 2014-10-07 DIAGNOSIS — M1990 Unspecified osteoarthritis, unspecified site: Secondary | ICD-10-CM | POA: Diagnosis not present

## 2014-10-07 DIAGNOSIS — Z79899 Other long term (current) drug therapy: Secondary | ICD-10-CM | POA: Diagnosis not present

## 2014-10-07 DIAGNOSIS — K219 Gastro-esophageal reflux disease without esophagitis: Secondary | ICD-10-CM | POA: Diagnosis not present

## 2014-10-07 DIAGNOSIS — Z87891 Personal history of nicotine dependence: Secondary | ICD-10-CM | POA: Insufficient documentation

## 2014-10-07 DIAGNOSIS — S83249A Other tear of medial meniscus, current injury, unspecified knee, initial encounter: Secondary | ICD-10-CM | POA: Diagnosis present

## 2014-10-07 DIAGNOSIS — S83242A Other tear of medial meniscus, current injury, left knee, initial encounter: Secondary | ICD-10-CM | POA: Diagnosis not present

## 2014-10-07 DIAGNOSIS — X58XXXA Exposure to other specified factors, initial encounter: Secondary | ICD-10-CM | POA: Diagnosis not present

## 2014-10-07 HISTORY — DX: Pulmonary fibrosis, unspecified: J84.10

## 2014-10-07 HISTORY — DX: Personal history of vulvar dysplasia: Z87.412

## 2014-10-07 HISTORY — DX: Personal history of other venous thrombosis and embolism: Z86.718

## 2014-10-07 HISTORY — DX: Pain in left elbow: M25.522

## 2014-10-07 HISTORY — PX: KNEE ARTHROSCOPY: SHX127

## 2014-10-07 HISTORY — DX: Gastro-esophageal reflux disease without esophagitis: K21.9

## 2014-10-07 HISTORY — DX: Personal history of (healed) traumatic fracture: Z87.81

## 2014-10-07 LAB — POCT HEMOGLOBIN-HEMACUE: HEMOGLOBIN: 12.8 g/dL (ref 12.0–15.0)

## 2014-10-07 SURGERY — ARTHROSCOPY, KNEE
Anesthesia: General | Site: Knee | Laterality: Left

## 2014-10-07 MED ORDER — CEFAZOLIN SODIUM-DEXTROSE 2-3 GM-% IV SOLR: 2.0000 g | Freq: Once | INTRAVENOUS | Status: DC

## 2014-10-07 MED ORDER — ONDANSETRON HCL 4 MG/2ML IJ SOLN
INTRAMUSCULAR | Status: AC
  Filled 2014-10-07: qty 2

## 2014-10-07 MED ORDER — DEXAMETHASONE SODIUM PHOSPHATE 10 MG/ML IJ SOLN
10.0000 mg | Freq: Once | INTRAMUSCULAR | Status: DC
  Filled 2014-10-07: qty 1

## 2014-10-07 MED ORDER — CEFAZOLIN SODIUM 1-5 GM-% IV SOLN: 1.0000 g | Freq: Once | INTRAVENOUS | Status: DC

## 2014-10-07 MED ORDER — ACETAMINOPHEN 10 MG/ML IV SOLN
1000.0000 mg | Freq: Once | INTRAVENOUS | Status: DC
  Filled 2014-10-07: qty 100

## 2014-10-07 MED ORDER — CEFAZOLIN SODIUM-DEXTROSE 2-3 GM-% IV SOLR
2.0000 g | Freq: Once | INTRAVENOUS | Status: AC
  Administered 2014-10-07: 2 g via INTRAVENOUS

## 2014-10-07 MED ORDER — MIDAZOLAM HCL 2 MG/2ML IJ SOLN
INTRAMUSCULAR | Status: AC
  Filled 2014-10-07: qty 2

## 2014-10-07 MED ORDER — FENTANYL CITRATE (PF) 100 MCG/2ML IJ SOLN
INTRAMUSCULAR | Status: AC
  Filled 2014-10-07: qty 2

## 2014-10-07 MED ORDER — FENTANYL CITRATE (PF) 100 MCG/2ML IJ SOLN
INTRAMUSCULAR | Status: AC
  Filled 2014-10-07: qty 4

## 2014-10-07 MED ORDER — FENTANYL CITRATE (PF) 250 MCG/5ML IJ SOLN
INTRAMUSCULAR | Status: AC
  Filled 2014-10-07: qty 5

## 2014-10-07 MED ORDER — LIDOCAINE HCL (CARDIAC) 20 MG/ML IV SOLN
INTRAVENOUS | Status: DC | PRN
  Administered 2014-10-07: 50 mg via INTRAVENOUS

## 2014-10-07 MED ORDER — HYDROCODONE-ACETAMINOPHEN 5-325 MG PO TABS: 1.0000 | ORAL_TABLET | Freq: Four times a day (QID) | ORAL | Status: DC | PRN

## 2014-10-07 MED ORDER — HYDROCODONE-ACETAMINOPHEN 5-325 MG PO TABS
1.0000 | ORAL_TABLET | ORAL | Status: DC | PRN
Start: 2014-10-07 — End: 2014-10-07
  Administered 2014-10-07: 1 via ORAL
  Filled 2014-10-07: qty 1

## 2014-10-07 MED ORDER — METHOCARBAMOL 500 MG PO TABS: 500.0000 mg | ORAL_TABLET | Freq: Four times a day (QID) | ORAL | Status: DC

## 2014-10-07 MED ORDER — METHOCARBAMOL 500 MG PO TABS
500.0000 mg | ORAL_TABLET | Freq: Once | ORAL | Status: AC
  Administered 2014-10-07: 500 mg via ORAL
  Filled 2014-10-07: qty 1

## 2014-10-07 MED ORDER — DEXAMETHASONE SODIUM PHOSPHATE 10 MG/ML IJ SOLN
10.0000 mg | Freq: Once | INTRAMUSCULAR | Status: AC
  Administered 2014-10-07: 10 mg via INTRAVENOUS

## 2014-10-07 MED ORDER — MEPERIDINE HCL 50 MG/ML IJ SOLN: 6.2500 mg | INTRAMUSCULAR | Status: DC | PRN

## 2014-10-07 MED ORDER — LACTATED RINGERS IV SOLN
INTRAVENOUS | Status: DC
  Administered 2014-10-07: 1000 mL via INTRAVENOUS
  Administered 2014-10-07 (×2): via INTRAVENOUS
  Filled 2014-10-07: qty 1000

## 2014-10-07 MED ORDER — BUPIVACAINE HCL (PF) 0.25 % IJ SOLN
INTRAMUSCULAR | Status: AC
  Filled 2014-10-07: qty 30

## 2014-10-07 MED ORDER — ACETAMINOPHEN 10 MG/ML IV SOLN
1000.0000 mg | Freq: Once | INTRAVENOUS | Status: AC
  Administered 2014-10-07: 1000 mg via INTRAVENOUS
  Filled 2014-10-07: qty 100

## 2014-10-07 MED ORDER — MIDAZOLAM HCL 5 MG/5ML IJ SOLN
INTRAMUSCULAR | Status: DC | PRN
Start: 2014-10-07 — End: 2014-10-07
  Administered 2014-10-07: 2 mg via INTRAVENOUS

## 2014-10-07 MED ORDER — BUPIVACAINE-EPINEPHRINE 0.25% -1:200000 IJ SOLN
INTRAMUSCULAR | Status: DC | PRN
  Administered 2014-10-07: 20 mL

## 2014-10-07 MED ORDER — ONDANSETRON HCL 4 MG/2ML IJ SOLN
INTRAMUSCULAR | Status: DC | PRN
  Administered 2014-10-07 (×2): 4 mg via INTRAVENOUS

## 2014-10-07 MED ORDER — CHLORHEXIDINE GLUCONATE 4 % EX LIQD
60.0000 mL | Freq: Once | CUTANEOUS | Status: DC
  Filled 2014-10-07: qty 60

## 2014-10-07 MED ORDER — FENTANYL CITRATE (PF) 100 MCG/2ML IJ SOLN
INTRAMUSCULAR | Status: DC | PRN
  Administered 2014-10-07: 25 ug via INTRAVENOUS
  Administered 2014-10-07: 100 ug via INTRAVENOUS
  Administered 2014-10-07: 25 ug via INTRAVENOUS
  Administered 2014-10-07: 50 ug via INTRAVENOUS

## 2014-10-07 MED ORDER — LACTATED RINGERS IR SOLN
Status: DC | PRN
  Administered 2014-10-07: 9000 mL

## 2014-10-07 MED ORDER — FENTANYL CITRATE (PF) 100 MCG/2ML IJ SOLN
25.0000 ug | INTRAMUSCULAR | Status: DC | PRN
  Administered 2014-10-07 (×2): 50 ug via INTRAVENOUS

## 2014-10-07 MED ORDER — SODIUM CHLORIDE 0.9 % IV SOLN
INTRAVENOUS | Status: DC
  Filled 2014-10-07: qty 1000

## 2014-10-07 MED ORDER — PROPOFOL 10 MG/ML IV BOLUS
INTRAVENOUS | Status: AC
  Filled 2014-10-07: qty 20

## 2014-10-07 MED ORDER — CEFAZOLIN SODIUM-DEXTROSE 2-3 GM-% IV SOLR
INTRAVENOUS | Status: AC
  Filled 2014-10-07: qty 50

## 2014-10-07 MED ORDER — PROPOFOL 10 MG/ML IV BOLUS
INTRAVENOUS | Status: DC | PRN
  Administered 2014-10-07: 200 mg via INTRAVENOUS

## 2014-10-07 MED ORDER — PROMETHAZINE HCL 25 MG/ML IJ SOLN: 6.2500 mg | INTRAMUSCULAR | Status: DC | PRN

## 2014-10-07 MED ORDER — DEXAMETHASONE SODIUM PHOSPHATE 10 MG/ML IJ SOLN
INTRAMUSCULAR | Status: AC
  Filled 2014-10-07: qty 1

## 2014-10-07 MED ORDER — CEFAZOLIN SODIUM-DEXTROSE 2-3 GM-% IV SOLR
2.0000 g | INTRAVENOUS | Status: DC
  Filled 2014-10-07: qty 50

## 2014-10-07 MED ORDER — MIDAZOLAM HCL 2 MG/2ML IJ SOLN
INTRAMUSCULAR | Status: AC
Start: 2014-10-07 — End: 2014-10-07
  Filled 2014-10-07: qty 2

## 2014-10-07 SURGICAL SUPPLY — 33 items
BANDAGE ELASTIC 6 VELCRO ST LF (GAUZE/BANDAGES/DRESSINGS) IMPLANT
BLADE 4.2CUDA (BLADE) IMPLANT
BLADE CUDA SHAVER 3.5 (BLADE) IMPLANT
BLADE CUTTER GATOR 3.5 (BLADE) IMPLANT
CANISTER SUCT LVC 12 LTR MEDI- (MISCELLANEOUS) IMPLANT
CANISTER SUCTION 2500CC (MISCELLANEOUS) IMPLANT
CLOTH BEACON ORANGE TIMEOUT ST (SAFETY) IMPLANT
DRAPE ARTHROSCOPY W/POUCH 114 (DRAPES) IMPLANT
DRSG EMULSION OIL 3X3 NADH (GAUZE/BANDAGES/DRESSINGS) IMPLANT
DURAPREP 26ML APPLICATOR (WOUND CARE) IMPLANT
ELECT MENISCUS 165MM 90D (ELECTRODE) IMPLANT
ELECT REM PT RETURN 9FT ADLT (ELECTROSURGICAL)
ELECTRODE REM PT RTRN 9FT ADLT (ELECTROSURGICAL) IMPLANT
GLOVE BIO SURGEON STRL SZ8 (GLOVE) IMPLANT
GLOVE INDICATOR 8.0 STRL GRN (GLOVE) IMPLANT
GOWN STRL REUS W/ TWL LRG LVL3 (GOWN DISPOSABLE) IMPLANT
GOWN STRL REUS W/TWL LRG LVL3 (GOWN DISPOSABLE)
IV NS IRRIG 3000ML ARTHROMATIC (IV SOLUTION) IMPLANT
KNEE WRAP E Z 3 GEL PACK (MISCELLANEOUS) IMPLANT
PACK ARTHROSCOPY DSU (CUSTOM PROCEDURE TRAY) IMPLANT
PACK BASIN DAY SURGERY FS (CUSTOM PROCEDURE TRAY) IMPLANT
PADDING CAST ABS 4INX4YD NS (CAST SUPPLIES)
PADDING CAST ABS COTTON 4X4 ST (CAST SUPPLIES) IMPLANT
PADDING CAST COTTON 6X4 STRL (CAST SUPPLIES) IMPLANT
PENCIL BUTTON HOLSTER BLD 10FT (ELECTRODE) IMPLANT
SET ARTHROSCOPY TUBING (MISCELLANEOUS)
SET ARTHROSCOPY TUBING LN (MISCELLANEOUS) IMPLANT
SPONGE GAUZE 4X4 12PLY (GAUZE/BANDAGES/DRESSINGS) IMPLANT
SUT ETHILON 4 0 PS 2 18 (SUTURE) IMPLANT
TOWEL OR 17X24 6PK STRL BLUE (TOWEL DISPOSABLE) IMPLANT
WAND 30 DEG SABER W/CORD (SURGICAL WAND) IMPLANT
WAND 90 DEG TURBOVAC W/CORD (SURGICAL WAND) IMPLANT
WATER STERILE IRR 500ML POUR (IV SOLUTION) IMPLANT

## 2014-10-07 SURGICAL SUPPLY — 28 items
BANDAGE ELASTIC 6 VELCRO ST LF (GAUZE/BANDAGES/DRESSINGS) ×3 IMPLANT
BLADE 4.2CUDA (BLADE) ×3 IMPLANT
COVER SURGICAL LIGHT HANDLE (MISCELLANEOUS) IMPLANT
CUFF TOURN SGL QUICK 34 (TOURNIQUET CUFF) ×2
CUFF TRNQT CYL 34X4X40X1 (TOURNIQUET CUFF) ×1 IMPLANT
DRAPE U-SHAPE 47X51 STRL (DRAPES) ×3 IMPLANT
DRSG EMULSION OIL 3X3 NADH (GAUZE/BANDAGES/DRESSINGS) ×3 IMPLANT
DRSG PAD ABDOMINAL 8X10 ST (GAUZE/BANDAGES/DRESSINGS) IMPLANT
DURAPREP 26ML APPLICATOR (WOUND CARE) ×3 IMPLANT
GAUZE SPONGE 4X4 12PLY STRL (GAUZE/BANDAGES/DRESSINGS) ×3 IMPLANT
GAUZE SPONGE 4X4 16PLY XRAY LF (GAUZE/BANDAGES/DRESSINGS) ×3 IMPLANT
GLOVE BIO SURGEON STRL SZ8 (GLOVE) ×3 IMPLANT
GLOVE BIOGEL PI IND STRL 8 (GLOVE) ×1 IMPLANT
GLOVE BIOGEL PI INDICATOR 8 (GLOVE) ×2
GOWN STRL REUS W/TWL LRG LVL3 (GOWN DISPOSABLE) ×3 IMPLANT
KIT BASIN OR (CUSTOM PROCEDURE TRAY) ×3 IMPLANT
MANIFOLD NEPTUNE II (INSTRUMENTS) ×3 IMPLANT
MARKER PEN SURG W/LABELS BLK (STERILIZATION PRODUCTS) IMPLANT
PACK ARTHROSCOPY WL (CUSTOM PROCEDURE TRAY) ×3 IMPLANT
PACK ICE MAXI GEL EZY WRAP (MISCELLANEOUS) ×9 IMPLANT
PADDING CAST COTTON 6X4 STRL (CAST SUPPLIES) ×3 IMPLANT
POSITIONER SURGICAL ARM (MISCELLANEOUS) ×3 IMPLANT
SET ARTHROSCOPY TUBING (MISCELLANEOUS) ×2
SET ARTHROSCOPY TUBING LN (MISCELLANEOUS) ×1 IMPLANT
SUT ETHILON 4 0 PS 2 18 (SUTURE) ×3 IMPLANT
TOWEL OR 17X26 10 PK STRL BLUE (TOWEL DISPOSABLE) ×3 IMPLANT
WAND 90 DEG TURBOVAC W/CORD (SURGICAL WAND) ×3 IMPLANT
WRAP KNEE MAXI GEL POST OP (GAUZE/BANDAGES/DRESSINGS) ×3 IMPLANT

## 2014-10-07 NOTE — Discharge Instructions (Signed)
° °Dr. Shainna Faux °[facility] Specialist °[facility] °[address] °, [place] [address] °[phone] ° ° °Arthroscopic Procedure, Knee °An arthroscopic procedure can find what is wrong with your knee. °PROCEDURE °Arthroscopy is a surgical technique that allows your orthopedic surgeon to diagnose and treat your knee injury with accuracy. They will look into your knee through a small instrument. This is almost like a small (pencil sized) telescope. Because arthroscopy affects your knee less than open knee surgery, you can anticipate a more rapid recovery. Taking an active role by following your caregiver's instructions will help with rapid and complete recovery. Use crutches, rest, elevation, ice, and knee exercises as instructed. The length of recovery depends on various factors including type of injury, age, physical condition, medical conditions, and your rehabilitation. °Your knee is the joint between the large bones (femur and tibia) in your leg. Cartilage covers these bone ends which are smooth and slippery and allow your knee to bend and move smoothly. Two menisci, thick, semi-lunar shaped pads of cartilage which form a rim inside the joint, help absorb shock and stabilize your knee. Ligaments bind the bones together and support your knee joint. Muscles move the joint, help support your knee, and take stress off the joint itself. Because of this all programs and physical therapy to rehabilitate an injured or repaired knee require rebuilding and strengthening your muscles. °AFTER THE PROCEDURE °· After the procedure, you will be moved to a recovery area until most of the effects of the medication have worn off. Your caregiver will discuss the test results with you.  °· Only take over-the-counter or prescription medicines for pain, discomfort, or fever as directed by your caregiver.  °SEEK MEDICAL CARE IF:  °· You have increased bleeding from your wounds.  °· You see  redness, swelling, or have increasing pain in your wounds.  °· You have pus coming from your wound.  °· You have an oral temperature above 102° F (38.9° C).  °· You notice a bad smell coming from the wound or dressing.  °· You have severe pain with any motion of your knee.  °SEEK IMMEDIATE MEDICAL CARE IF:  °· You develop a rash.  °· You have difficulty breathing.  °· You have any allergic problems.  °FURTHER INSTRUCTIONS:  °· ICE to the affected knee every three hours for 30 minutes at a time and then as needed for pain and swelling.  Continue to use ice on the knee for pain and swelling from surgery. You may notice swelling that will progress down to the foot and ankle.  This is normal after surgery.  Elevate the leg when you are not up walking on it.   ° °DIET °You may resume your previous home diet once your are discharged from the hospital. ° °DRESSING / WOUND CARE / SHOWERING °You may start showering two days after being discharged home but do not submerge the incisions under water.  °Change dressing 48 hours after the procedure and then cover the small incisions with band aids until your follow up visit. °Change the surgical dressings daily and reapply a dry dressing each time.  ° °ACTIVITY °Walk with your walker as instructed. °Use walker as long as suggested by your caregivers. °Avoid periods of inactivity such as sitting longer than an hour when not asleep. This helps prevent blood clots.  °You may resume a sexual relationship in one month or when given the OK by your doctor.  °You may return to   work once you are cleared by your doctor.  Do not drive a car for 6 weeks or until released by you surgeon.  Do not drive while taking narcotics.  WEIGHT BEARING You may bear weight as tolerated on the left leg  POSTOPERATIVE CONSTIPATION PROTOCOL Constipation - defined medically as fewer than three stools per week and severe constipation as less than one stool per week.  One of the most common issues  patients have following surgery is constipation.  Even if you have a regular bowel pattern at home, your normal regimen is likely to be disrupted due to multiple reasons following surgery.  Combination of anesthesia, postoperative narcotics, change in appetite and fluid intake all can affect your bowels.  In order to avoid complications following surgery, here are some recommendations in order to help you during your recovery period.  Colace (docusate) - Pick up an over-the-counter form of Colace or another stool softener and take twice a day as long as you are requiring postoperative pain medications.  Take with a full glass of water daily.  If you experience loose stools or diarrhea, hold the colace until you stool forms back up.  If your symptoms do not get better within 1 week or if they get worse, check with your doctor.  Dulcolax (bisacodyl) - Pick up over-the-counter and take as directed by the product packaging as needed to assist with the movement of your bowels.  Take with a full glass of water.  Use this product as needed if not relieved by Colace only.   MiraLax (polyethylene glycol) - Pick up over-the-counter to have on hand.  MiraLax is a solution that will increase the amount of water in your bowels to assist with bowel movements.  Take as directed and can mix with a glass of water, juice, soda, coffee, or tea.  Take if you go more than two days without a movement. Do not use MiraLax more than once per day. Call your doctor if you are still constipated or irregular after using this medication for 7 days in a row.  If you continue to have problems with postoperative constipation, please contact the office for further assistance and recommendations.  If you experience "the worst abdominal pain ever" or develop nausea or vomiting, please contact the office immediatly for further recommendations for treatment.  ITCHING  If you experience itching with your medications, try taking only a single  pain pill, or even half a pain pill at a time.  You can also use Benadryl over the counter for itching or also to help with sleep.   TED HOSE STOCKINGS Wear the elastic stockings on both legs for three weeks following surgery during the day but you may remove then at night for sleeping.  MEDICATIONS See your medication summary on the After Visit Summary that the nursing staff will review with you prior to discharge.  You may have some home medications which will be placed on hold until you complete the course of blood thinner medication.  It is important for you to complete the blood thinner medication as prescribed by your surgeon.  Continue your approved medications as instructed at time of discharge. Do not drive while taking narcotics.   PRECAUTIONS If you experience chest pain or shortness of breath - call 911 immediately for transfer to the hospital emergency department.  If you develop a fever greater that 101 F, purulent drainage from wound, increased redness or drainage from wound, foul odor from the wound/dressing, or calf  pain - CONTACT YOUR SURGEON.                                                   FOLLOW-UP APPOINTMENTS Make sure you keep all of your appointments after your operation with your surgeon and caregivers. You should call the office at [phone]  and make an appointment for approximately one week after the date of your surgery or on the date instructed by your surgeon outlined in the "After Visit Summary".  RANGE OF MOTION AND STRENGTHENING EXERCISES  Rehabilitation of the knee is important following a knee injury or an operation. After just a few days of immobilization, the muscles of the thigh which control the knee become weakened and shrink (atrophy). Knee exercises are designed to build up the tone and strength of the thigh muscles and to improve knee motion. Often times heat used for twenty to thirty minutes before working out will loosen up your tissues and help  with improving the range of motion but do not use heat for the first two weeks following surgery. These exercises can be done on a training (exercise) mat, on the floor, on a table or on a bed. Use what ever works the best and is most comfortable for you Knee exercises include:  QUAD STRENGTHENING EXERCISES Strengthening Quadriceps Sets  Tighten muscles on top of thigh by pushing knees down into floor or table. Hold for 20 seconds. Repeat 10 times. Do 2 sessions per day.     Strengthening Terminal Knee Extension  With knee bent over bolster, straighten knee by tightening muscle on top of thigh. Be sure to keep bottom of knee on bolster. Hold for 20 seconds. Repeat 10 times. Do 2 sessions per day.   Straight Leg with Bent Knee  Lie on back with opposite leg bent. Keep involved knee slightly bent at knee and raise leg 4-6". Hold for 10 seconds. Repeat 20 times per set. Do 2 sets per session. Do 2 sessions per day.

## 2014-10-07 NOTE — Transfer of Care (Signed)
Immediate Anesthesia Transfer of Care Note  Patient: Sherry Barnes  Procedure(s) Performed: Procedure(s): LEFT ARTHROSCOPY KNEE WITH MEDIAL MENISCAL DEBRIDEMENT (Left)  Patient Location: PACU  Anesthesia Type:General  Level of Consciousness:  sedated, patient cooperative and responds to stimulation  Airway & Oxygen Therapy:Patient Spontanous Breathing and Patient connected to face mask oxgen  Post-op Assessment:  Report given to PACU RN and Post -op Vital signs reviewed and stable  Post vital signs:  Reviewed and stable  Last Vitals:  Filed Vitals:   10/07/14 0821  BP: 134/78  Pulse: 78  Temp: 36.7 C  Resp: 16    Complications: No apparent anesthesia complications

## 2014-10-07 NOTE — Anesthesia Postprocedure Evaluation (Signed)
  Anesthesia Post-op Note  Patient: Sherry Barnes  Procedure(s) Performed: Procedure(s) (LRB): LEFT ARTHROSCOPY KNEE WITH MEDIAL MENISCAL DEBRIDEMENT (Left)  Patient Location: PACU  Anesthesia Type: General  Level of Consciousness: awake and alert   Airway and Oxygen Therapy: Patient Spontanous Breathing  Post-op Pain: mild  Post-op Assessment: Post-op Vital signs reviewed, Patient's Cardiovascular Status Stable, Respiratory Function Stable, Patent Airway and No signs of Nausea or vomiting  Last Vitals:  Filed Vitals:   10/07/14 1315  BP: 137/64  Pulse: 82  Temp: 37.1 C  Resp: 12    Post-op Vital Signs: stable   Complications: No apparent anesthesia complications

## 2014-10-07 NOTE — Progress Notes (Signed)
Surgery moved to main to get a earlier start.

## 2014-10-07 NOTE — Interval H&P Note (Signed)
History and Physical Interval Note:  10/07/2014 9:52 AM  Sherry Barnes  has presented today for surgery, with the diagnosis of left medial meniscus tear  The various methods of treatment have been discussed with the patient and family. After consideration of risks, benefits and other options for treatment, the patient has consented to  Procedure(s): LEFT ARTHROSCOPY KNEE (Left) as a surgical intervention .  The patient's history has been reviewed, patient examined, no change in status, stable for surgery.  I have reviewed the patient's chart and labs.  Questions were answered to the patient's satisfaction.     [name]

## 2014-10-07 NOTE — Op Note (Signed)
Preoperative diagnosis-  Left knee medial meniscal tear  Postoperative diagnosis Left- knee medial meniscal tear  Procedure- Left knee arthroscopy with medial  meniscal debridement    Surgeon- Dione Plover. Salvador Bigbee, MD  Anesthesia-General  EBL-  Minimal  Complications- None  Condition- PACU - hemodynamically stable.  Brief clinical note- -Sherry Barnes is a 69 y.o.  female with a several month history of left knee pain and mechanical symptoms. Exam and history suggested medial meniscal tear confirmed by MRI. The patient presents now for arthroscopy and debridement  Procedure in detail -       After successful administration of General anesthetic, a tourmiquet is placed high on the Left  thigh and the Left lower extremity is prepped and draped in the usual sterile fashion. Time out is performed by the surgical team. Standard superomedial and inferolateral portal sites are marked and incisions made with an 11 blade. The inflow cannula is passed through the superomedial portal and camera through the inferolateral portal and inflow is initiated. Arthroscopic visualization proceeds.      The undersurface of the patella and trochlea are visualized and there is minimal chondromalacia. The medial and lateral gutters are visualized and there are  no loose bodies. Flexion and valgus force is applied to the knee and the medial compartment is entered. A spinal needle is passed into the joint through the site marked for the inferomedial portal. A small incision is made and the dilator passed into the joint. The findings for the medial compartment are degenerative unstable tear body and posterior horn medial meniscus with no chondral defects. . The tear is debrided to a stable base with baskets and a shaver and sealed off with the Arthrocare. It is probed and found to be stable.    The intercondylar notch is visualized and the ACL appears normal. The lateral compartment is entered and the findings are normal  .    The joint is again inspected and there are no other tears, defects or loose bodies identified. The arthroscopic equipment is then removed from the inferior portals which are closed with interrupted 4-0 nylon. 20 ml of .25% Marcaine with epinephrine are injected through the inflow cannula and the cannula is then removed and the portal closed with nylon. The incisions are cleaned and dried and a bulky sterile dressing is applied. The patient is then awakened and transported to recovery in stable condition.   10/07/2014, 10:55 AM

## 2014-10-07 NOTE — Anesthesia Preprocedure Evaluation (Addendum)
Anesthesia Evaluation  Patient identified by MRN, date of birth, ID band Patient awake    History of Anesthesia Complications (+) PONV and history of anesthetic complications  Airway Mallampati: II  TM Distance: >3 FB     Dental  (+) Teeth Intact, Dental Advisory Given   Pulmonary former smoker,    Pulmonary exam normal       Cardiovascular Rhythm:Regular Rate:Normal     Neuro/Psych    GI/Hepatic Neg liver ROS, GERD-  ,  Endo/Other  negative endocrine ROS  Renal/GU negative Renal ROS     Musculoskeletal  (+) Arthritis -, Osteoarthritis,    Abdominal   Peds  Hematology   Anesthesia Other Findings   Reproductive/Obstetrics                           Anesthesia Physical Anesthesia Plan  ASA: II  Anesthesia Plan: General   Post-op Pain Management:    Induction: Intravenous  Airway Management Planned: LMA  Additional Equipment:   Intra-op Plan:   Post-operative Plan: Extubation in OR  Informed Consent: I have reviewed the patients History and Physical, chart, labs and discussed the procedure including the risks, benefits and alternatives for the proposed anesthesia with the patient or authorized representative who has indicated his/her understanding and acceptance.   Dental advisory given  Plan Discussed with: CRNA  Anesthesia Plan Comments:         Anesthesia Quick Evaluation                                  Anesthesia Evaluation  Patient identified by MRN, date of birth, ID band Patient awake    Reviewed: Allergy & Precautions, H&P , NPO status , Patient's Chart, lab work & pertinent test results, reviewed documented beta blocker date and time   History of Anesthesia Complications (+) PONV  Airway  TM Distance: >3 FB Neck ROM: Full    Dental  (+) Caps   Pulmonary neg pulmonary ROS,  clear to auscultation        Cardiovascular neg cardio ROS  Regular Normal Denies cardiac symptoms   Neuro/Psych Negative Neurological ROS  Negative Psych ROS   GI/Hepatic negative GI ROS, Neg liver ROS,   Endo/Other  Negative Endocrine ROS  Renal/GU negative Renal ROS  Genitourinary negative   Musculoskeletal negative musculoskeletal ROS (+)   Abdominal   Peds negative pediatric ROS (+)  Hematology negative hematology ROS (+)   Anesthesia Other Findings Caps in back  Reproductive/Obstetrics negative OB ROS                          Anesthesia Physical Anesthesia Plan  ASA: II  Anesthesia Plan: General   Post-op Pain Management:    Induction: Intravenous  Airway Management Planned: Oral ETT  Additional Equipment:   Intra-op Plan:   Post-operative Plan: Extubation in OR  Informed Consent: I have reviewed the patients History and Physical, chart, labs and discussed the procedure including the risks, benefits and alternatives for the proposed anesthesia with the patient or authorized representative who has indicated his/her understanding and acceptance.     Plan Discussed with: CRNA and Surgeon  Anesthesia Plan Comments:        Anesthesia Quick Evaluation

## 2014-10-08 ENCOUNTER — Encounter (HOSPITAL_COMMUNITY): Payer: Self-pay | Admitting: Orthopedic Surgery

## 2014-10-13 ENCOUNTER — Encounter (HOSPITAL_COMMUNITY): Payer: Self-pay | Admitting: Orthopedic Surgery

## 2015-07-15 ENCOUNTER — Ambulatory Visit: Payer: Self-pay | Admitting: Orthopedic Surgery

## 2015-07-15 NOTE — Progress Notes (Signed)
Preoperative surgical orders have been place into the Epic hospital system for Sherry Barnes on 07/15/2015, 10:21 AM  by Mickel Crow for surgery on 07-28-2015.  Preop Knee Scope orders including IV Tylenol and IV Decadron as long as there are no contraindications to the above medications. Arlee Muslim, PA-C

## 2015-07-21 ENCOUNTER — Encounter (HOSPITAL_BASED_OUTPATIENT_CLINIC_OR_DEPARTMENT_OTHER): Payer: Self-pay | Admitting: *Deleted

## 2015-07-21 NOTE — Progress Notes (Signed)
NPO AFTER MN.  ARRIVE AT 0800.  NEEDS HG.  MAY TAKE TYLENOL AM DOS W/ SIPS OF WATER .

## 2015-07-26 ENCOUNTER — Encounter (HOSPITAL_BASED_OUTPATIENT_CLINIC_OR_DEPARTMENT_OTHER): Payer: Self-pay | Admitting: *Deleted

## 2015-07-28 ENCOUNTER — Ambulatory Visit (HOSPITAL_COMMUNITY)
Admission: RE | Admit: 2015-07-28 | Discharge: 2015-07-28 | Disposition: A | Discharge status: Home / Self Care | Payer: 59 | Source: Ambulatory Visit | Attending: Orthopedic Surgery | Admitting: Orthopedic Surgery

## 2015-07-28 ENCOUNTER — Ambulatory Visit (HOSPITAL_COMMUNITY): Payer: 59 | Admitting: Anesthesiology

## 2015-07-28 ENCOUNTER — Encounter (HOSPITAL_BASED_OUTPATIENT_CLINIC_OR_DEPARTMENT_OTHER): Payer: Self-pay | Admitting: Anesthesiology

## 2015-07-28 ENCOUNTER — Encounter (HOSPITAL_COMMUNITY)
Admission: RE | Disposition: A | Discharge status: Home / Self Care | Payer: Self-pay | Source: Ambulatory Visit | Attending: Orthopedic Surgery

## 2015-07-28 DIAGNOSIS — Z79899 Other long term (current) drug therapy: Secondary | ICD-10-CM | POA: Diagnosis not present

## 2015-07-28 DIAGNOSIS — X58XXXA Exposure to other specified factors, initial encounter: Secondary | ICD-10-CM | POA: Insufficient documentation

## 2015-07-28 DIAGNOSIS — M1990 Unspecified osteoarthritis, unspecified site: Secondary | ICD-10-CM | POA: Diagnosis not present

## 2015-07-28 DIAGNOSIS — Z86718 Personal history of other venous thrombosis and embolism: Secondary | ICD-10-CM | POA: Diagnosis not present

## 2015-07-28 DIAGNOSIS — M2241 Chondromalacia patellae, right knee: Secondary | ICD-10-CM | POA: Insufficient documentation

## 2015-07-28 DIAGNOSIS — M810 Age-related osteoporosis without current pathological fracture: Secondary | ICD-10-CM | POA: Insufficient documentation

## 2015-07-28 DIAGNOSIS — S83241A Other tear of medial meniscus, current injury, right knee, initial encounter: Secondary | ICD-10-CM | POA: Diagnosis not present

## 2015-07-28 DIAGNOSIS — K219 Gastro-esophageal reflux disease without esophagitis: Secondary | ICD-10-CM | POA: Insufficient documentation

## 2015-07-28 HISTORY — DX: Unspecified tear of unspecified meniscus, current injury, right knee, initial encounter: S83.206A

## 2015-07-28 HISTORY — DX: Age-related osteoporosis without current pathological fracture: M81.0

## 2015-07-28 HISTORY — PX: KNEE ARTHROSCOPY: SHX127

## 2015-07-28 LAB — HEMOGLOBIN: HEMOGLOBIN: 12.6 g/dL (ref 12.0–15.0)

## 2015-07-28 SURGERY — ARTHROSCOPY, KNEE
Anesthesia: General | Laterality: Right

## 2015-07-28 MED ORDER — CEFAZOLIN SODIUM-DEXTROSE 2-3 GM-% IV SOLR
INTRAVENOUS | Status: AC
  Filled 2015-07-28: qty 50

## 2015-07-28 MED ORDER — HYDROCODONE-ACETAMINOPHEN 5-325 MG PO TABS
1.0000 | ORAL_TABLET | ORAL | Status: DC | PRN
  Administered 2015-07-28: 1 via ORAL
  Filled 2015-07-28: qty 1

## 2015-07-28 MED ORDER — FENTANYL CITRATE (PF) 250 MCG/5ML IJ SOLN
INTRAMUSCULAR | Status: AC
  Filled 2015-07-28: qty 5

## 2015-07-28 MED ORDER — ROCURONIUM BROMIDE 100 MG/10ML IV SOLN
INTRAVENOUS | Status: AC
Start: 2015-07-28 — End: 2015-07-28
  Filled 2015-07-28: qty 1

## 2015-07-28 MED ORDER — LACTATED RINGERS IV SOLN
INTRAVENOUS | Status: DC
  Administered 2015-07-28 (×2): via INTRAVENOUS

## 2015-07-28 MED ORDER — BUPIVACAINE-EPINEPHRINE 0.25% -1:200000 IJ SOLN
INTRAMUSCULAR | Status: DC | PRN
  Administered 2015-07-28: 20 mL

## 2015-07-28 MED ORDER — PROPOFOL 10 MG/ML IV BOLUS
INTRAVENOUS | Status: AC
  Filled 2015-07-28: qty 40

## 2015-07-28 MED ORDER — ACETAMINOPHEN 10 MG/ML IV SOLN
INTRAVENOUS | Status: AC
  Filled 2015-07-28: qty 100

## 2015-07-28 MED ORDER — SODIUM CHLORIDE 0.9 % IV SOLN: INTRAVENOUS | Status: DC

## 2015-07-28 MED ORDER — PROPOFOL 10 MG/ML IV BOLUS
INTRAVENOUS | Status: DC | PRN
  Administered 2015-07-28: 170 mg via INTRAVENOUS

## 2015-07-28 MED ORDER — ONDANSETRON HCL 4 MG/2ML IJ SOLN: 4.0000 mg | Freq: Once | INTRAMUSCULAR | Status: DC | PRN

## 2015-07-28 MED ORDER — ONDANSETRON HCL 4 MG/2ML IJ SOLN
INTRAMUSCULAR | Status: AC
  Filled 2015-07-28: qty 2

## 2015-07-28 MED ORDER — FENTANYL CITRATE (PF) 100 MCG/2ML IJ SOLN
INTRAMUSCULAR | Status: DC | PRN
  Administered 2015-07-28 (×4): 50 ug via INTRAVENOUS

## 2015-07-28 MED ORDER — HYDROCODONE-ACETAMINOPHEN 5-325 MG PO TABS: 1.0000 | ORAL_TABLET | ORAL | Status: DC | PRN

## 2015-07-28 MED ORDER — HYDROMORPHONE HCL 1 MG/ML IJ SOLN
0.5000 mg | INTRAMUSCULAR | Status: DC | PRN
  Administered 2015-07-28 (×2): 0.5 mg via INTRAVENOUS

## 2015-07-28 MED ORDER — CHLORHEXIDINE GLUCONATE 4 % EX LIQD
60.0000 mL | Freq: Once | CUTANEOUS | Status: DC
Start: 2015-07-28 — End: 2015-07-28

## 2015-07-28 MED ORDER — LIDOCAINE HCL (CARDIAC) 20 MG/ML IV SOLN
INTRAVENOUS | Status: DC | PRN
  Administered 2015-07-28: 50 mg via INTRAVENOUS

## 2015-07-28 MED ORDER — MIDAZOLAM HCL 2 MG/2ML IJ SOLN
INTRAMUSCULAR | Status: AC
  Filled 2015-07-28: qty 2

## 2015-07-28 MED ORDER — EPHEDRINE SULFATE 50 MG/ML IJ SOLN
INTRAMUSCULAR | Status: AC
  Filled 2015-07-28: qty 1

## 2015-07-28 MED ORDER — LACTATED RINGERS IR SOLN
Status: DC | PRN
  Administered 2015-07-28: 6000 mL

## 2015-07-28 MED ORDER — METHOCARBAMOL 500 MG PO TABS: 500.0000 mg | ORAL_TABLET | Freq: Four times a day (QID) | ORAL | Status: DC

## 2015-07-28 MED ORDER — GLYCOPYRROLATE 0.2 MG/ML IJ SOLN
INTRAMUSCULAR | Status: AC
  Filled 2015-07-28: qty 1

## 2015-07-28 MED ORDER — DEXAMETHASONE SODIUM PHOSPHATE 10 MG/ML IJ SOLN
INTRAMUSCULAR | Status: AC
  Filled 2015-07-28: qty 1

## 2015-07-28 MED ORDER — METOCLOPRAMIDE HCL 5 MG/ML IJ SOLN
INTRAMUSCULAR | Status: DC | PRN
  Administered 2015-07-28: 10 mg via INTRAVENOUS

## 2015-07-28 MED ORDER — CEFAZOLIN SODIUM-DEXTROSE 2-3 GM-% IV SOLR
2.0000 g | INTRAVENOUS | Status: AC
  Administered 2015-07-28: 2 g via INTRAVENOUS

## 2015-07-28 MED ORDER — DEXAMETHASONE SODIUM PHOSPHATE 10 MG/ML IJ SOLN
10.0000 mg | Freq: Once | INTRAMUSCULAR | Status: AC
  Administered 2015-07-28: 10 mg via INTRAVENOUS

## 2015-07-28 MED ORDER — SODIUM CHLORIDE 0.9 % IJ SOLN
INTRAMUSCULAR | Status: AC
  Filled 2015-07-28: qty 10

## 2015-07-28 MED ORDER — HYDROMORPHONE HCL 1 MG/ML IJ SOLN
INTRAMUSCULAR | Status: AC
  Filled 2015-07-28: qty 1

## 2015-07-28 MED ORDER — ACETAMINOPHEN 10 MG/ML IV SOLN
1000.0000 mg | Freq: Once | INTRAVENOUS | Status: AC
  Administered 2015-07-28: 1000 mg via INTRAVENOUS

## 2015-07-28 MED ORDER — MIDAZOLAM HCL 5 MG/5ML IJ SOLN
INTRAMUSCULAR | Status: DC | PRN
  Administered 2015-07-28: 2 mg via INTRAVENOUS

## 2015-07-28 MED ORDER — SODIUM CHLORIDE 0.9 % IR SOLN
Status: DC | PRN
  Administered 2015-07-28: 6000 mL

## 2015-07-28 MED ORDER — LIDOCAINE HCL (CARDIAC) 20 MG/ML IV SOLN
INTRAVENOUS | Status: AC
  Filled 2015-07-28: qty 5

## 2015-07-28 MED ORDER — ONDANSETRON HCL 4 MG/2ML IJ SOLN
INTRAMUSCULAR | Status: DC | PRN
  Administered 2015-07-28: 4 mg via INTRAVENOUS

## 2015-07-28 MED ORDER — BUPIVACAINE-EPINEPHRINE (PF) 0.25% -1:200000 IJ SOLN
INTRAMUSCULAR | Status: AC
  Filled 2015-07-28: qty 30

## 2015-07-28 SURGICAL SUPPLY — 26 items
BANDAGE ACE 6X5 VEL STRL LF (GAUZE/BANDAGES/DRESSINGS) ×3 IMPLANT
BLADE 4.2CUDA (BLADE) ×3 IMPLANT
COVER SURGICAL LIGHT HANDLE (MISCELLANEOUS) ×3 IMPLANT
CUFF TOURN SGL QUICK 34 (TOURNIQUET CUFF) ×2
CUFF TRNQT CYL 34X4X40X1 (TOURNIQUET CUFF) ×1 IMPLANT
DRAPE U-SHAPE 47X51 STRL (DRAPES) ×3 IMPLANT
DRSG EMULSION OIL 3X3 NADH (GAUZE/BANDAGES/DRESSINGS) ×3 IMPLANT
DRSG PAD ABDOMINAL 8X10 ST (GAUZE/BANDAGES/DRESSINGS) ×3 IMPLANT
DURAPREP 26ML APPLICATOR (WOUND CARE) ×3 IMPLANT
GAUZE SPONGE 4X4 12PLY STRL (GAUZE/BANDAGES/DRESSINGS) ×3 IMPLANT
GLOVE BIO SURGEON STRL SZ8 (GLOVE) ×3 IMPLANT
GLOVE BIOGEL PI IND STRL 8 (GLOVE) ×1 IMPLANT
GLOVE BIOGEL PI INDICATOR 8 (GLOVE) ×2
GOWN STRL REUS W/TWL LRG LVL3 (GOWN DISPOSABLE) ×3 IMPLANT
KIT BASIN OR (CUSTOM PROCEDURE TRAY) ×3 IMPLANT
MANIFOLD NEPTUNE II (INSTRUMENTS) ×3 IMPLANT
MARKER SKIN DUAL TIP RULER LAB (MISCELLANEOUS) ×3 IMPLANT
PACK ARTHROSCOPY WL (CUSTOM PROCEDURE TRAY) ×3 IMPLANT
PACK ICE MAXI GEL EZY WRAP (MISCELLANEOUS) ×9 IMPLANT
PADDING CAST COTTON 6X4 STRL (CAST SUPPLIES) ×6 IMPLANT
POSITIONER SURGICAL ARM (MISCELLANEOUS) ×3 IMPLANT
SUT ETHILON 4 0 PS 2 18 (SUTURE) ×3 IMPLANT
TOWEL OR 17X26 10 PK STRL BLUE (TOWEL DISPOSABLE) ×3 IMPLANT
TUBING ARTHRO INFLOW-ONLY STRL (TUBING) ×3 IMPLANT
WAND HAND CNTRL MULTIVAC 90 (MISCELLANEOUS) ×3 IMPLANT
WRAP KNEE MAXI GEL POST OP (GAUZE/BANDAGES/DRESSINGS) ×3 IMPLANT

## 2015-07-28 NOTE — Discharge Instructions (Signed)
° °Dr. Frank Aluisio °Total Joint Specialist °[facility] °[address] °[place], [place] [address] °[phone] ° ° °Arthroscopic Procedure, Knee °An arthroscopic procedure can find what is wrong with your knee. °PROCEDURE °Arthroscopy is a surgical technique that allows your orthopedic surgeon to diagnose and treat your knee injury with accuracy. They will look into your knee through a small instrument. This is almost like a small (pencil sized) telescope. Because arthroscopy affects your knee less than open knee surgery, you can anticipate a more rapid recovery. Taking an active role by following your caregiver's instructions will help with rapid and complete recovery. Use crutches, rest, elevation, ice, and knee exercises as instructed. The length of recovery depends on various factors including type of injury, age, physical condition, medical conditions, and your rehabilitation. °Your knee is the joint between the large bones (femur and tibia) in your leg. Cartilage covers these bone ends which are smooth and slippery and allow your knee to bend and move smoothly. Two menisci, thick, semi-lunar shaped pads of cartilage which form a rim inside the joint, help absorb shock and stabilize your knee. Ligaments bind the bones together and support your knee joint. Muscles move the joint, help support your knee, and take stress off the joint itself. Because of this all programs and physical therapy to rehabilitate an injured or repaired knee require rebuilding and strengthening your muscles. °AFTER THE PROCEDURE °· After the procedure, you will be moved to a recovery area until most of the effects of the medication have worn off. Your caregiver will discuss the test results with you.  °· Only take over-the-counter or prescription medicines for pain, discomfort, or fever as directed by your caregiver.  °SEEK MEDICAL CARE IF:  °· You have increased bleeding from your wounds.  °· You see  redness, swelling, or have increasing pain in your wounds.  °· You have pus coming from your wound.  °· You have an oral temperature above 102° F (38.9° C).  °· You notice a bad smell coming from the wound or dressing.  °· You have severe pain with any motion of your knee.  °SEEK IMMEDIATE MEDICAL CARE IF:  °· You develop a rash.  °· You have difficulty breathing.  °· You have any allergic problems.  °FURTHER INSTRUCTIONS:  °· ICE to the affected knee every three hours for 30 minutes at a time and then as needed for pain and swelling.  Continue to use ice on the knee for pain and swelling from surgery. You may notice swelling that will progress down to the foot and ankle.  This is normal after surgery.  Elevate the leg when you are not up walking on it.   ° °DIET °You may resume your previous home diet once your are discharged from the hospital. ° °DRESSING / WOUND CARE / SHOWERING °You may start showering two days after being discharged home but do not submerge the incisions under water.  °Change dressing 48 hours after the procedure and then cover the small incisions with band aids until your follow up visit. °Change the surgical dressings daily and reapply a dry dressing each time.  ° °ACTIVITY °Walk with your walker as instructed. °Use walker as long as suggested by your caregivers. °Avoid periods of inactivity such as sitting longer than an hour when not asleep. This helps prevent blood clots.  °You may resume a sexual relationship in one month or when given the OK by your doctor.  °You may return to   work once you are cleared by your doctor.  °Do not drive a car for 6 weeks or until released by you surgeon.  °Do not drive while taking narcotics. ° °WEIGHT BEARING AS TOLERATED ° °POSTOPERATIVE CONSTIPATION PROTOCOL °Constipation - defined medically as fewer than three stools per week and severe constipation as less than one stool per week. ° °One of the most common issues patients have following surgery is  constipation.  Even if you have a regular bowel pattern at home, your normal regimen is likely to be disrupted due to multiple reasons following surgery.  Combination of anesthesia, postoperative narcotics, change in appetite and fluid intake all can affect your bowels.  In order to avoid complications following surgery, here are some recommendations in order to help you during your recovery period. ° °Colace (docusate) - Pick up an over-the-counter form of Colace or another stool softener and take twice a day as long as you are requiring postoperative pain medications.  Take with a full glass of water daily.  If you experience loose stools or diarrhea, hold the colace until you stool forms back up.  If your symptoms do not get better within 1 week or if they get worse, check with your doctor. ° °Dulcolax (bisacodyl) - Pick up over-the-counter and take as directed by the product packaging as needed to assist with the movement of your bowels.  Take with a full glass of water.  Use this product as needed if not relieved by Colace only.  ° °MiraLax (polyethylene glycol) - Pick up over-the-counter to have on hand.  MiraLax is a solution that will increase the amount of water in your bowels to assist with bowel movements.  Take as directed and can mix with a glass of water, juice, soda, coffee, or tea.  Take if you go more than two days without a movement. °Do not use MiraLax more than once per day. Call your doctor if you are still constipated or irregular after using this medication for 7 days in a row. ° °If you continue to have problems with postoperative constipation, please contact the office for further assistance and recommendations.  If you experience "the worst abdominal pain ever" or develop nausea or vomiting, please contact the office immediatly for further recommendations for treatment. ° °ITCHING ° If you experience itching with your medications, try taking only a single pain pill, or even half a pain pill  at a time.  You can also use Benadryl over the counter for itching or also to help with sleep.  ° °TED HOSE STOCKINGS °Wear the elastic stockings on both legs for three weeks following surgery during the day but you may remove then at night for sleeping. ° °MEDICATIONS °See your medication summary on the “After Visit Summary” that the nursing staff will review with you prior to discharge.  You may have some home medications which will be placed on hold until you complete the course of blood thinner medication.  It is important for you to complete the blood thinner medication as prescribed by your surgeon.  Continue your approved medications as instructed at time of discharge. °Do not drive while taking narcotics.  ° °PRECAUTIONS °If you experience chest pain or shortness of breath - call 911 immediately for transfer to the hospital emergency department.  °If you develop a fever greater that 101 F, purulent drainage from wound, increased redness or drainage from wound, foul odor from the wound/dressing, or calf pain - CONTACT YOUR SURGEON.   °                                                °  FOLLOW-UP APPOINTMENTS Make sure you keep all of your appointments after your operation with your surgeon and caregivers. You should call the office at [phone]  and make an appointment for approximately one week after the date of your surgery or on the date instructed by your surgeon outlined in the "After Visit Summary".  RANGE OF MOTION AND STRENGTHENING EXERCISES  Rehabilitation of the knee is important following a knee injury or an operation. After just a few days of immobilization, the muscles of the thigh which control the knee become weakened and shrink (atrophy). Knee exercises are designed to build up the tone and strength of the thigh muscles and to improve knee motion. Often times heat used for twenty to thirty minutes before working out will loosen up your tissues and help with improving the range of motion  but do not use heat for the first two weeks following surgery. These exercises can be done on a training (exercise) mat, on the floor, on a table or on a bed. Use what ever works the best and is most comfortable for you Knee exercises include:  QUAD STRENGTHENING EXERCISES Strengthening Quadriceps Sets  Tighten muscles on top of thigh by pushing knees down into floor or table. Hold for 20 seconds. Repeat 10 times. Do 2 sessions per day.     Strengthening Terminal Knee Extension  With knee bent over bolster, straighten knee by tightening muscle on top of thigh. Be sure to keep bottom of knee on bolster. Hold for 20 seconds. Repeat 10 times. Do 2 sessions per day.   Straight Leg with Bent Knee  Lie on back with opposite leg bent. Keep involved knee slightly bent at knee and raise leg 4-6". Hold for 10 seconds. Repeat 20 times per set. Do 2 sets per session. Do 2 sessions per day.   General Anesthesia, Adult, Care After Refer to this sheet in the next few weeks. These instructions provide you with information on caring for yourself after your procedure. Your health care provider may also give you more specific instructions. Your treatment has been planned according to current medical practices, but problems sometimes occur. Call your health care provider if you have any problems or questions after your procedure. WHAT TO EXPECT AFTER THE PROCEDURE After the procedure, it is typical to experience:  Sleepiness.  Nausea and vomiting. HOME CARE INSTRUCTIONS  For the first 24 hours after general anesthesia:  Have a responsible person with you.  Do not drive a car. If you are alone, do not take public transportation.  Do not drink alcohol.  Do not take medicine that has not been prescribed by your health care provider.  Do not sign important papers or make important decisions.  You may resume a normal diet and activities as directed by your health care provider.  If you have  questions or problems that seem related to general anesthesia, call the hospital and ask for the anesthetist or anesthesiologist on call. SEEK MEDICAL CARE IF:  You have nausea and vomiting that continue the day after anesthesia.  You develop a rash. SEEK IMMEDIATE MEDICAL CARE IF:   You have difficulty breathing.  You have chest pain.  You have any allergic problems.   This information is not intended to replace advice given to you by your health care provider. Make sure you discuss any questions you have with your health care provider.   Document Released: 09/11/2000 Document Revised: 06/26/2014 Document Reviewed: 10/04/2011 Elsevier Interactive Patient Education [employer].

## 2015-07-28 NOTE — H&P (Signed)
CC- Sherry Barnes is a 70 y.o. female who presents with right knee pain.  HPI- . Knee Pain: Patient presents with knee pain involving the  right knee. Onset of the symptoms was several months ago. Inciting event: none known. Current symptoms include giving out, pain located medially and stiffness. Pain is aggravated by lateral movements, rising after sitting and walking.  Patient has had no prior knee problems. Evaluation to date: MRI: abnormal medial meniscal tear. Treatment to date: corticosteroid injection which was not very effective.  Past Medical History  Diagnosis Date  . Osteopenia     right hip, right neck  . PONV (postoperative nausea and vomiting)   . History of DVT of lower extremity     behind left knee--  2011  resolved  . Granulomatous lung disease     PULMOLOGIST--  DR Lamonte Sakai--  PFT'S NORMAL  . Osteoarthritis   . History of vulvar dysplasia   . Arthralgia of left elbow   . History of cervical fracture     C5  laminar fx non-displace 02-21-2013 no surgical intervention  . GERD (gastroesophageal reflux disease)   . Wears glasses   . Right knee meniscal tear   . Osteoporosis     LEFT HIP AND NECK  . Osteoporosis     left neck and left hip    Past Surgical History  Procedure Laterality Date  . Gynecologic cryosurgery  1995     cervical dysplasia  . Cervical cone biopsy  05/15/1972  . Cholecystectomy  01/27/09  . Ventral hernia repair  05/02/2011    Procedure: LAPAROSCOPIC VENTRAL HERNIA;  Surgeon: Adin Hector, MD;  Location: [facility];  Service: General;  Laterality: N/A;  attempted Laparoscopic ventraL hernia repair with mesh, open ventral hernia repair   . Cardiovascular stress test  08-26-2010   dr Shanon Brow harding    Low Risk perfusion scan/  observed defect is consistant with diaphragmatic attenuation, no significant wall motion abnormalities,  ef 78%  , small area mild reversible perfusion defect in the RCA territory (per cardiologist false positive)  .  Tonsillectomy and adenoidectomy  1970  . Neuroplasty / transposition ulnar nerve at elbow Left 02-24-2002  . Tubal ligation  12-22-1982  . Umbilical hernia repair  01-27-2009  . Laparotomy w/ bilateral ovarian dermoid cystectomy  04-06-1971    and APPENDECTOMY  . Condylectomy left fifth toe  06-16-1999  . Wide local excision left vulva  06-26-2007  . Transthoracic echocardiogram  04-22-2008    normal /   ef 60-65%/  mild TR  . Abdominal hysterectomy  10-21-1996    AND BLADDER SLING  . Knee arthroscopy Left 10/07/2014    Procedure: LEFT ARTHROSCOPY KNEE WITH MEDIAL MENISCAL DEBRIDEMENT;  Surgeon: Gaynelle Arabian, MD;  Location: [facility];  Service: Orthopedics;  Laterality: Left;    Prior to Admission medications   Medication Sig Start Date End Date Taking? Authorizing Provider  acetaminophen (TYLENOL) 500 MG tablet Take 1,000 mg by mouth every 6 (six) hours as needed for moderate pain or headache.    Yes Historical Provider, MD  BIOTIN PO Take 1,000 mcg by mouth daily.    Yes Historical Provider, MD  calcium carbonate (TUMS - DOSED IN MG ELEMENTAL CALCIUM) 500 MG chewable tablet Chew 1 tablet by mouth daily as needed for indigestion or heartburn.    Yes Historical Provider, MD  CALCIUM PO Take 1 tablet by mouth daily.   Yes Historical Provider, MD  Cholecalciferol (VITAMIN D3) 1000 UNITS  CAPS Take 2,000 Units by mouth daily.    Yes Historical Provider, MD  Naproxen Sodium (ALEVE) 220 MG CAPS Take 220 mg by mouth daily as needed (pain.).    Yes Historical Provider, MD  Polyethyl Glycol-Propyl Glycol (SYSTANE ULTRA OP) Apply 1-2 drops to eye daily as needed (dry eyes.).   Yes Historical Provider, MD  Probiotic Product (PROBIOTIC DAILY PO) Take 1 tablet by mouth daily.    Yes Historical Provider, MD  vitamin B-12 (CYANOCOBALAMIN) 1000 MCG tablet Take 1,000 mcg by mouth daily.   Yes Historical Provider, MD  vitamin C (ASCORBIC ACID) 500 MG tablet Take 500 mg by mouth daily.   Yes Historical  Provider, MD   KNEE EXAM antalgic gait, soft tissue tenderness over medial joint line, no effusion, negative drawer sign, collateral ligaments intact  Physical Examination: General appearance - alert, well appearing, and in no distress Mental status - alert, oriented to person, place, and time Chest - clear to auscultation, no wheezes, rales or rhonchi, symmetric air entry Heart - normal rate, regular rhythm, normal S1, S2, no murmurs, rubs, clicks or gallops Abdomen - soft, nontender, nondistended, no masses or organomegaly Neurological - alert, oriented, normal speech, no focal findings or movement disorder noted   Asessment/Plan--- Right knee medial meniscal tear- - Plan Rightt knee arthroscopy with meniscal debridement. Procedure risks and potential comps discussed with patient who elects to proceed. Goals are decreased pain and increased function with a high likelihood of achieving both

## 2015-07-28 NOTE — Progress Notes (Signed)
"  Feeling better. Ready to go "

## 2015-07-28 NOTE — Interval H&P Note (Signed)
History and Physical Interval Note:  07/28/2015 9:44 AM  Barry Brunner  has presented today for surgery, with the diagnosis of RIGHT KNEE MEDIAL MENISCUS TEAR  The various methods of treatment have been discussed with the patient and family. After consideration of risks, benefits and other options for treatment, the patient has consented to  Procedure(s): RIGHT KNEE ARTHROSCOPY WITH MENISCUS DEBRIDEMENT (Right) as a surgical intervention .  The patient's history has been reviewed, patient examined, no change in status, stable for surgery.  I have reviewed the patient's chart and labs.  Questions were answered to the patient's satisfaction.     [name]

## 2015-07-28 NOTE — Op Note (Signed)
Preoperative diagnosis-  Right knee medial meniscal tear  Postoperative diagnosis Right- knee medial meniscal tear plus  Chondral defect  Procedure- Right knee arthroscopy with medial  meniscal debridement and chondroplasty   Surgeon- Dione Plover. Kaelani Kendrick, MD  Anesthesia-General  EBL-  Minimal  Complications- None  Condition- PACU - hemodynamically stable.  Brief clinical note- -Sherry Barnes is a 70 y.o.  female with a several month history of right knee pain and mechanical symptoms. Exam and history suggested medial meniscal tear confirmed by MRI. The patient presents now for arthroscopy and debridement   Procedure in detail -       After successful administration of General anesthetic, a tourmiquet is placed high on the Right  thigh and the Right lower extremity is prepped and draped in the usual sterile fashion. Time out is performed by the surgical team. Standard superomedial and inferolateral portal sites are marked and incisions made with an 11 blade. The inflow cannula is passed through the superomedial portal and camera through the inferolateral portal and inflow is initiated. Arthroscopic visualization proceeds.      The undersurface of the patella and trochlea are visualized and there is Grade II chondromalacia patella with no unstable chondral defects. The trochlea looks normal.. The medial and lateral gutters are visualized and there are  no loose bodies. Flexion and valgus force is applied to the knee and the medial compartment is entered. A spinal needle is passed into the joint through the site marked for the inferomedial portal. A small incision is made and the dilator passed into the joint. The findings for the medial compartment are unstable tear posterior horn medial meniscus with 1 x 2 cm unstable chondral defect medial femoral condyle . The tear is debrided to a stable base with baskets and a shaver and sealed off with the Arthrocare. The shaver is used to debride the  unstable cartilage to a stable bony base with stable edges. It is probed and found to be stable.The exposed bone is abraded with the shaver. The remainder of the medial femoral condyle had normal cartilage.    The intercondylar notch is visualized and the ACL appears normal. The lateral compartment is entered and the findings are normal .      The joint is again inspected and there are no other tears, defects or loose bodies identified. The arthroscopic equipment is then removed from the inferior portals which are closed with interrupted 4-0 nylon. 20 ml of .25% Marcaine with epinephrine are injected through the inflow cannula and the cannula is then removed and the portal closed with nylon. The incisions are cleaned and dried and a bulky sterile dressing is applied. The patient is then awakened and transported to recovery in stable condition.   07/28/2015, 10:35 AM

## 2015-07-28 NOTE — Anesthesia Postprocedure Evaluation (Signed)
Anesthesia Post Note  Patient: Sherry Barnes  Procedure(s) Performed: Procedure(s) (LRB): RIGHT KNEE ARTHROSCOPY WITH MENISCUS DEBRIDEMENT (Right)  Patient location during evaluation: PACU Anesthesia Type: General Level of consciousness: awake, awake and alert, oriented and patient cooperative Pain management: pain level controlled Vital Signs Assessment: post-procedure vital signs reviewed and stable Respiratory status: spontaneous breathing and respiratory function stable Cardiovascular status: blood pressure returned to baseline Anesthetic complications: no    Last Vitals:  Filed Vitals:   07/28/15 1137 07/28/15 1314  BP: 128/59 105/51  Pulse:  82  Temp:    Resp:  16    Last Pain:  Filed Vitals:   07/28/15 1315  PainSc: 3                  Nil Xiong EDWARD

## 2015-07-28 NOTE — Progress Notes (Signed)
Tolerated up to BR well w assistance at first. Denied dizziness, "just moving slow". While on bench getting pants on Became flush, hot, nauseated and then pale. Back to bed w cool  Damp cloth to forehead. After back in a bed a few minutes states nauea paseed and color coming back to cheeks.

## 2015-07-28 NOTE — Transfer of Care (Signed)
Immediate Anesthesia Transfer of Care Note  Patient: Sherry Barnes  Procedure(s) Performed: Procedure(s): RIGHT KNEE ARTHROSCOPY WITH MENISCUS DEBRIDEMENT (Right)  Patient Location: PACU  Anesthesia Type:General  Level of Consciousness:  sedated, patient cooperative and responds to stimulation  Airway & Oxygen Therapy:Patient Spontanous Breathing and Patient connected to face mask oxgen  Post-op Assessment:  Report given to PACU RN and Post -op Vital signs reviewed and stable  Post vital signs:  Reviewed and stable  Last Vitals:  Filed Vitals:   07/28/15 0731  BP: 127/82  Pulse: 91  Temp: 36.7 C  Resp: 18    Complications: No apparent anesthesia complications

## 2015-07-28 NOTE — Anesthesia Preprocedure Evaluation (Signed)
Anesthesia Evaluation  Patient identified by MRN, date of birth, ID band Patient awake    Reviewed: Allergy & Precautions, NPO status , Patient's Chart, lab work & pertinent test results  History of Anesthesia Complications (+) PONV  Airway Mallampati: I  TM Distance: >3 FB     Dental   Pulmonary former smoker,    Pulmonary exam normal        Cardiovascular Normal cardiovascular exam     Neuro/Psych    GI/Hepatic GERD  ,  Endo/Other    Renal/GU      Musculoskeletal  (+) Arthritis ,   Abdominal   Peds  Hematology   Anesthesia Other Findings   Reproductive/Obstetrics                             Anesthesia Physical Anesthesia Plan  ASA: I  Anesthesia Plan: General   Post-op Pain Management:    Induction: Intravenous  Airway Management Planned: LMA and Oral ETT  Additional Equipment:   Intra-op Plan:   Post-operative Plan: Extubation in OR  Informed Consent: I have reviewed the patients History and Physical, chart, labs and discussed the procedure including the risks, benefits and alternatives for the proposed anesthesia with the patient or authorized representative who has indicated his/her understanding and acceptance.     Plan Discussed with: CRNA, Anesthesiologist and Surgeon  Anesthesia Plan Comments:         Anesthesia Quick Evaluation

## 2015-09-22 ENCOUNTER — Ambulatory Visit (INDEPENDENT_AMBULATORY_CARE_PROVIDER_SITE_OTHER): Payer: PRIVATE HEALTH INSURANCE | Admitting: Podiatry

## 2015-09-22 ENCOUNTER — Ambulatory Visit (INDEPENDENT_AMBULATORY_CARE_PROVIDER_SITE_OTHER): Payer: PRIVATE HEALTH INSURANCE

## 2015-09-22 ENCOUNTER — Encounter: Payer: Self-pay | Admitting: Podiatry

## 2015-09-22 VITALS — BP 123/77 | HR 84 | Resp 16

## 2015-09-22 DIAGNOSIS — M79673 Pain in unspecified foot: Secondary | ICD-10-CM

## 2015-09-22 DIAGNOSIS — D3610 Benign neoplasm of peripheral nerves and autonomic nervous system, unspecified: Secondary | ICD-10-CM

## 2015-09-22 DIAGNOSIS — M779 Enthesopathy, unspecified: Secondary | ICD-10-CM

## 2015-09-22 DIAGNOSIS — L84 Corns and callosities: Secondary | ICD-10-CM | POA: Diagnosis not present

## 2015-09-22 MED ORDER — TRIAMCINOLONE ACETONIDE 10 MG/ML IJ SUSP
10.0000 mg | Freq: Once | INTRAMUSCULAR | Status: AC
  Administered 2015-09-22: 10 mg

## 2015-09-22 NOTE — Progress Notes (Signed)
   Subjective:    Patient ID: Sherry Barnes, female    DOB: August 19, 1945, 70 y.o.   MRN: [id number]  HPI    Review of Systems  All other systems reviewed and are negative.      Objective:   Physical Exam        Assessment & Plan:

## 2015-09-23 NOTE — Progress Notes (Signed)
Subjective:     Patient ID: Sherry Barnes, female   DOB: 1945-09-10, 70 y.o.   MRN: [id number]  HPI patient presents with painful callus on the bottom of both feet side of the left foot with inflammation and bone issues on the left fifth metatarsal that can become painful at times   Review of Systems  All other systems reviewed and are negative.      Objective:   Physical Exam  Constitutional: She is oriented to person, place, and time.  Cardiovascular: Intact distal pulses.   Musculoskeletal: Normal range of motion.  Neurological: She is oriented to person, place, and time.  Skin: Skin is warm.  Nursing note and vitals reviewed.  neurovascular status intact muscle strength adequate range of motion within normal limits with patient found to have inflammatory changes around the fifth metatarsal head left that are painful with bone structural issues and keratotic lesion of the plantar aspect of both feet. Digital perfusion within normal limits     Assessment:     Inflammatory capsulitis fifth MPJ left with tailor's bunion deformity and keratotic lesions bilateral    Plan:     H&P and x-rays of the left reviewed. Today I did a careful injection around the fifth MPJ plantar capsule 3 mg dexamethasone Kenalog 5 mg Xylocaine and debrided lesions plantar aspect both feet. Reappoint to recheck  X-ray report indicates that there is slight enlargement of the fourth 5 intermetatarsal angle left with inflammation around the area

## 2015-11-12 DIAGNOSIS — M170 Bilateral primary osteoarthritis of knee: Secondary | ICD-10-CM | POA: Diagnosis not present

## 2015-11-12 DIAGNOSIS — M1712 Unilateral primary osteoarthritis, left knee: Secondary | ICD-10-CM | POA: Diagnosis not present

## 2016-02-17 DIAGNOSIS — M1712 Unilateral primary osteoarthritis, left knee: Secondary | ICD-10-CM | POA: Diagnosis not present

## 2016-02-24 ENCOUNTER — Other Ambulatory Visit: Payer: Self-pay | Admitting: Orthopedic Surgery

## 2016-02-24 DIAGNOSIS — M1711 Unilateral primary osteoarthritis, right knee: Secondary | ICD-10-CM

## 2016-02-28 ENCOUNTER — Other Ambulatory Visit: Payer: Self-pay | Admitting: Orthopedic Surgery

## 2016-02-28 DIAGNOSIS — M1712 Unilateral primary osteoarthritis, left knee: Secondary | ICD-10-CM

## 2016-03-03 ENCOUNTER — Ambulatory Visit
Admission: RE | Admit: 2016-03-03 | Discharge: 2016-03-03 | Disposition: A | Discharge status: Home / Self Care | Payer: Medicare Other | Source: Ambulatory Visit | Attending: Orthopedic Surgery | Admitting: Orthopedic Surgery

## 2016-03-03 DIAGNOSIS — M1712 Unilateral primary osteoarthritis, left knee: Secondary | ICD-10-CM

## 2016-03-03 DIAGNOSIS — M179 Osteoarthritis of knee, unspecified: Secondary | ICD-10-CM | POA: Diagnosis not present

## 2016-03-03 DIAGNOSIS — Z01818 Encounter for other preprocedural examination: Secondary | ICD-10-CM | POA: Diagnosis not present

## 2016-04-04 ENCOUNTER — Ambulatory Visit: Payer: Medicare Other | Admitting: Cardiovascular Disease

## 2016-04-11 ENCOUNTER — Ambulatory Visit (INDEPENDENT_AMBULATORY_CARE_PROVIDER_SITE_OTHER): Payer: Medicare Other | Admitting: Cardiovascular Disease

## 2016-04-11 ENCOUNTER — Encounter: Payer: Self-pay | Admitting: Cardiovascular Disease

## 2016-04-11 VITALS — BP 132/86 | HR 82 | Ht 62.0 in | Wt 207.0 lb

## 2016-04-11 DIAGNOSIS — R9439 Abnormal result of other cardiovascular function study: Secondary | ICD-10-CM

## 2016-04-11 DIAGNOSIS — E7800 Pure hypercholesterolemia, unspecified: Secondary | ICD-10-CM | POA: Diagnosis not present

## 2016-04-11 DIAGNOSIS — R0609 Other forms of dyspnea: Secondary | ICD-10-CM | POA: Diagnosis not present

## 2016-04-11 DIAGNOSIS — E785 Hyperlipidemia, unspecified: Secondary | ICD-10-CM | POA: Insufficient documentation

## 2016-04-11 DIAGNOSIS — Z01818 Encounter for other preprocedural examination: Secondary | ICD-10-CM | POA: Diagnosis not present

## 2016-04-11 NOTE — Assessment & Plan Note (Signed)
Sherry Barnes had an abnormal Myoview stress test performed 08/26/10 with subtle inferior ischemia. Based on this, she was begun on metoprolol by Dr. Ellyn Hack which she ultimately discontinued. She denies chest pain. She is scheduled for an elective left total knee replacement. She has a family history of heart disease and dyspnea. I'm going to get a follow-up Myoview stress test to risk stratify her.

## 2016-04-11 NOTE — Assessment & Plan Note (Signed)
Patient relates symptoms compatible dyspnea on exertion. She is scheduled to have elective left total knee replacement early next year. We will get a 2-D echo for LV function.

## 2016-04-11 NOTE — Progress Notes (Signed)
04/11/2016 Sherry Barnes   12/21/1945  [id number]  Primary Physician Sherry Pel, MD Primary Cardiologist: Sherry Harp MD Sherry Barnes, [place]  HPI:  Ms. Sherry Barnes is a very pleasant 70 year old mildly overweight Caucasian female mother of 2 children, her mother and one grandchild who is referred for cardiovascular evaluation and risk stratification prior to elective left total knee replacement by Dr. Maureen Barnes early next year. Her primary care provider is Dr. Deland Barnes . She has a history of hyperlipidemia intolerant to statin therapy. She did smoke remotely as an adult. Stopped at age 65. Her mother had stents in her 30s. She has never had a heart attack or stroke. She denies chest pain but does complain of dyspnea. She apparently had an abnormal Myoview stress test performed 08/22/10 remarkable for subtle inferior ischemia. She was begun by Dr. Glenetta Barnes on low-dose beta blocker which she subsequently discontinued.   Current Outpatient Prescriptions  Medication Sig Dispense Refill  . acetaminophen (TYLENOL) 500 MG tablet Take 1,000 mg by mouth every 6 (six) hours as needed for moderate pain or headache.     Marland Kitchen BIOTIN PO Take 1,000 mcg by mouth daily.     . calcium carbonate (TUMS - DOSED IN MG ELEMENTAL CALCIUM) 500 MG chewable tablet Chew 1 tablet by mouth daily as needed for indigestion or heartburn.     Marland Kitchen CALCIUM PO Take 1 tablet by mouth daily.    . Cholecalciferol (VITAMIN D3) 1000 UNITS CAPS Take 2,000 Units by mouth daily.     . Naproxen Sodium (ALEVE) 220 MG CAPS Take 220 mg by mouth daily as needed (pain.).     Marland Kitchen Olopatadine HCl (PAZEO) 0.7 % SOLN Apply to eye.    . Probiotic Product (PROBIOTIC DAILY PO) Take 1 tablet by mouth daily.     . vitamin B-12 (CYANOCOBALAMIN) 1000 MCG tablet Take 1,000 mcg by mouth daily.    . vitamin C (ASCORBIC ACID) 500 MG tablet Take 500 mg by mouth daily.     No current facility-administered medications for this visit.      No Known Allergies  Social History   Social History  . Marital status: Married    Spouse name: N/A  . Number of children: N/A  . Years of education: N/A   Occupational History  . Not on file.   Social History Main Topics  . Smoking status: Former Smoker    Packs/day: 1.00    Years: 10.00    Types: Cigarettes    Quit date: 02/17/1974  . Smokeless tobacco: Never Used  . Alcohol use Yes     Comment: occaional  . Drug use: No  . Sexual activity: Not on file   Other Topics Concern  . Not on file   Social History Narrative  . No narrative on file     Review of Systems: General: negative for chills, fever, night sweats or weight changes.  Cardiovascular: negative for chest pain, dyspnea on exertion, edema, orthopnea, palpitations, paroxysmal nocturnal dyspnea or shortness of breath Dermatological: negative for rash Respiratory: negative for cough or wheezing Urologic: negative for hematuria Abdominal: negative for nausea, vomiting, diarrhea, bright red blood per rectum, melena, or hematemesis Neurologic: negative for visual changes, syncope, or dizziness All other systems reviewed and are otherwise negative except as noted above.    Blood pressure 132/86, pulse 82, height 5\' 2"  (1.575 m), weight 207 lb (93.9 kg).  General appearance: alert and no distress Neck: no adenopathy, no  carotid bruit, no JVD, supple, symmetrical, trachea midline and thyroid not enlarged, symmetric, no tenderness/mass/nodules Lungs: clear to auscultation bilaterally Heart: regular rate and rhythm, S1, S2 normal, no murmur, click, rub or gallop Extremities: extremities normal, atraumatic, no cyanosis or edema  EKG normal sinus rhythm 82 with poor R-wave progression consistent with anterior myocardial infarction. I suspect this is lead placement. I personally reviewed this EKG  ASSESSMENT AND PLAN:   Hyperlipidemia History of hyperlipidemia intolerant to statin therapy  Dyspnea on  exertion Patient relates symptoms compatible dyspnea on exertion. She is scheduled to have elective left total knee replacement early next year. We will get a 2-D echo for LV function.  Abnormal nuclear stress test Ms. Sherry Barnes had an abnormal Myoview stress test performed 08/26/10 with subtle inferior ischemia. Based on this, she was begun on metoprolol by Dr. Ellyn Barnes which she ultimately discontinued. She denies chest pain. She is scheduled for an elective left total knee replacement. She has a family history of heart disease and dyspnea. I'm going to get a follow-up Myoview stress test to risk stratify her.      Sherry Harp MD FACP,FACC,FAHA, FSCAI 04/11/2016 4:00 PM

## 2016-04-11 NOTE — Assessment & Plan Note (Signed)
History of hyperlipidemia intolerant to statin therapy 

## 2016-04-11 NOTE — Patient Instructions (Addendum)
Medication Instructions:  Your physician recommends that you continue on your current medications as directed. Please refer to the Current Medication list given to you today.   Testing/Procedures: Your physician has requested that you have an echocardiogram. Echocardiography is a painless test that uses sound waves to create images of your heart. It provides your doctor with information about the size and shape of your heart and how well your heart's chambers and valves are working. This procedure takes approximately one hour. There are no restrictions for this procedure.  Your physician has requested that you have a lexiscan myoview. For further information please visit [email or web address]. Please follow instruction sheet, as given.   Follow-Up: Your physician recommends that you schedule a follow-up appointment in: as needed with Dr. Gwenlyn Found.   Any Other Special Instructions Will Be Listed Below (If Applicable).  Pharmacologic Stress Electrocardiogram A pharmacologic stress electrocardiogram is a heart (cardiac) test that uses nuclear imaging to evaluate the blood supply to your heart. This test may also be called a pharmacologic stress electrocardiography. Pharmacologic means that a medicine is used to increase your heart rate and blood pressure.  This stress test is done to find areas of poor blood flow to the heart by determining the extent of coronary artery disease (CAD). Some people exercise on a treadmill, which naturally increases the blood flow to the heart. For those people unable to exercise on a treadmill, a medicine is used. This medicine stimulates your heart and will cause your heart to beat harder and more quickly, as if you were exercising.  Pharmacologic stress tests can help determine:  The adequacy of blood flow to your heart during increased levels of activity in order to clear you for discharge home.  The extent of coronary artery blockage caused by CAD.  Your prognosis  if you have suffered a heart attack.  The effectiveness of cardiac procedures done, such as an angioplasty, which can increase the circulation in your coronary arteries.  Causes of chest pain or pressure. LET [facility] CARE PROVIDER KNOW ABOUT:  Any allergies you have.  All medicines you are taking, including vitamins, herbs, eye drops, creams, and over-the-counter medicines.  Previous problems you or members of your family have had with the use of anesthetics.  Any blood disorders you have.  Previous surgeries you have had.  Medical conditions you have.  Possibility of pregnancy, if this applies.  If you are currently breastfeeding. RISKS AND COMPLICATIONS Generally, this is a safe procedure. However, as with any procedure, complications can occur. Possible complications include:  You develop pain or pressure in the following areas:  Chest.  Jaw or neck.  Between your shoulder blades.  Radiating down your left arm.  Headache.  Dizziness or light-headedness.  Shortness of breath.  Increased or irregular heartbeat.  Low blood pressure.  Nausea or vomiting.  Flushing.  Redness going up the arm and slight pain during injection of medicine.  Heart attack (rare). BEFORE THE PROCEDURE   Avoid all forms of caffeine for 24 hours before your test or as directed by your health care provider. This includes coffee, tea (even decaffeinated tea), caffeinated sodas, chocolate, cocoa, and certain pain medicines.  Follow your health care provider's instructions regarding eating and drinking before the test.  Take your medicines as directed at regular times with water unless instructed otherwise. Exceptions may include:  If you have diabetes, ask how you are to take your insulin or pills. It is common to adjust insulin dosing the morning  of the test.  If you are taking beta-blocker medicines, it is important to talk to your health care provider about these medicines well  before the date of your test. Taking beta-blocker medicines may interfere with the test. In some cases, these medicines need to be changed or stopped 24 hours or more before the test.  If you wear a nitroglycerin patch, it may need to be removed prior to the test. Ask your health care provider if the patch should be removed before the test.  If you use an inhaler for any breathing condition, bring it with you to the test.  If you are an outpatient, bring a snack so you can eat right after the stress phase of the test.  Do not smoke for 4 hours prior to the test or as directed by your health care provider.  Do not apply lotions, powders, creams, or oils on your chest prior to the test.  Wear comfortable shoes and clothing. Let your health care provider know if you were unable to complete or follow the preparations for your test. PROCEDURE   Multiple patches (electrodes) will be put on your chest. If needed, small areas of your chest may be shaved to get better contact with the electrodes. Once the electrodes are attached to your body, multiple wires will be attached to the electrodes, and your heart rate will be monitored.  An IV access will be started. A nuclear trace (isotope) is given. The isotope may be given intravenously, or it may be swallowed. Nuclear refers to several types of radioactive isotopes, and the nuclear isotope lights up the arteries so that the nuclear images are clear. The isotope is absorbed by your body. This results in low radiation exposure.  A resting nuclear image is taken to show how your heart functions at rest.  A medicine is given through the IV access.  A second scan is done about 1 hour after the medicine injection and determines how your heart functions under stress.  During this stress phase, you will be connected to an electrocardiogram machine. Your blood pressure and oxygen levels will be monitored. AFTER THE PROCEDURE   Your heart rate and blood  pressure will be monitored after the test.  You may return to your normal schedule, including diet,activities, and medicines, unless your health care provider tells you otherwise.   This information is not intended to replace advice given to you by your health care provider. Make sure you discuss any questions you have with your health care provider.   Document Released: 10/22/2008 Document Revised: 06/10/2013 Document Reviewed: 02/10/2013 Elsevier Interactive Patient Education [employer].    If you need a refill on your cardiac medications before your next appointment, please call your pha

## 2016-04-18 ENCOUNTER — Telehealth (HOSPITAL_COMMUNITY): Payer: Self-pay

## 2016-04-18 NOTE — Telephone Encounter (Signed)
Encounter complete. 

## 2016-04-20 ENCOUNTER — Ambulatory Visit (HOSPITAL_COMMUNITY)
Admission: RE | Admit: 2016-04-20 | Discharge: 2016-04-20 | Disposition: A | Discharge status: Home / Self Care | Payer: Medicare Other | Source: Ambulatory Visit | Attending: Cardiology | Admitting: Cardiology

## 2016-04-20 DIAGNOSIS — R0609 Other forms of dyspnea: Secondary | ICD-10-CM | POA: Diagnosis not present

## 2016-04-20 DIAGNOSIS — R9439 Abnormal result of other cardiovascular function study: Secondary | ICD-10-CM | POA: Insufficient documentation

## 2016-04-20 DIAGNOSIS — E7800 Pure hypercholesterolemia, unspecified: Secondary | ICD-10-CM | POA: Diagnosis not present

## 2016-04-20 DIAGNOSIS — Z01818 Encounter for other preprocedural examination: Secondary | ICD-10-CM | POA: Diagnosis not present

## 2016-04-20 LAB — MYOCARDIAL PERFUSION IMAGING
CHL CUP NUCLEAR SRS: 2
CHL CUP NUCLEAR SSS: 4
LV dias vol: 90 mL (ref 46–106)
LVSYSVOL: 30 mL
NUC STRESS TID: 1.29
Peak HR: 107 {beats}/min
Rest HR: 73 {beats}/min
SDS: 2

## 2016-04-20 MED ORDER — TECHNETIUM TC 99M TETROFOSMIN IV KIT
10.7000 | PACK | Freq: Once | INTRAVENOUS | Status: AC | PRN
  Administered 2016-04-20: 10.7 via INTRAVENOUS
  Filled 2016-04-20: qty 11

## 2016-04-20 MED ORDER — AMINOPHYLLINE 25 MG/ML IV SOLN
75.0000 mg | Freq: Once | INTRAVENOUS | Status: AC
  Administered 2016-04-20: 75 mg via INTRAVENOUS

## 2016-04-20 MED ORDER — TECHNETIUM TC 99M TETROFOSMIN IV KIT
30.8000 | PACK | Freq: Once | INTRAVENOUS | Status: AC | PRN
  Administered 2016-04-20: 30.8 via INTRAVENOUS
  Filled 2016-04-20: qty 31

## 2016-04-20 MED ORDER — REGADENOSON 0.4 MG/5ML IV SOLN
0.4000 mg | Freq: Once | INTRAVENOUS | Status: AC
  Administered 2016-04-20: 0.4 mg via INTRAVENOUS

## 2016-05-01 ENCOUNTER — Other Ambulatory Visit: Payer: Self-pay

## 2016-05-01 ENCOUNTER — Ambulatory Visit (HOSPITAL_COMMUNITY): Payer: Medicare Other | Attending: Cardiology

## 2016-05-01 DIAGNOSIS — E7800 Pure hypercholesterolemia, unspecified: Secondary | ICD-10-CM | POA: Diagnosis not present

## 2016-05-01 DIAGNOSIS — Z01818 Encounter for other preprocedural examination: Secondary | ICD-10-CM

## 2016-05-01 DIAGNOSIS — R9439 Abnormal result of other cardiovascular function study: Secondary | ICD-10-CM | POA: Diagnosis not present

## 2016-05-01 DIAGNOSIS — R0609 Other forms of dyspnea: Secondary | ICD-10-CM | POA: Diagnosis not present

## 2016-05-04 DIAGNOSIS — Z23 Encounter for immunization: Secondary | ICD-10-CM | POA: Diagnosis not present

## 2016-05-04 DIAGNOSIS — E559 Vitamin D deficiency, unspecified: Secondary | ICD-10-CM | POA: Diagnosis not present

## 2016-05-04 DIAGNOSIS — Z0000 Encounter for general adult medical examination without abnormal findings: Secondary | ICD-10-CM | POA: Diagnosis not present

## 2016-05-04 DIAGNOSIS — M8580 Other specified disorders of bone density and structure, unspecified site: Secondary | ICD-10-CM | POA: Diagnosis not present

## 2016-05-04 DIAGNOSIS — N390 Urinary tract infection, site not specified: Secondary | ICD-10-CM | POA: Diagnosis not present

## 2016-05-09 DIAGNOSIS — R0609 Other forms of dyspnea: Secondary | ICD-10-CM | POA: Diagnosis not present

## 2016-05-09 DIAGNOSIS — J8410 Pulmonary fibrosis, unspecified: Secondary | ICD-10-CM | POA: Diagnosis not present

## 2016-05-09 DIAGNOSIS — Z86718 Personal history of other venous thrombosis and embolism: Secondary | ICD-10-CM | POA: Diagnosis not present

## 2016-05-09 DIAGNOSIS — M159 Polyosteoarthritis, unspecified: Secondary | ICD-10-CM | POA: Diagnosis not present

## 2016-06-01 ENCOUNTER — Ambulatory Visit (INDEPENDENT_AMBULATORY_CARE_PROVIDER_SITE_OTHER): Payer: Medicare Other | Admitting: Podiatry

## 2016-06-01 ENCOUNTER — Encounter: Payer: Self-pay | Admitting: Podiatry

## 2016-06-01 DIAGNOSIS — Q828 Other specified congenital malformations of skin: Secondary | ICD-10-CM

## 2016-06-01 DIAGNOSIS — M79671 Pain in right foot: Secondary | ICD-10-CM

## 2016-06-01 DIAGNOSIS — M79672 Pain in left foot: Secondary | ICD-10-CM

## 2016-06-02 NOTE — Progress Notes (Signed)
Subjective:     Patient ID: Sherry Barnes, female   DOB: 02-12-1946, 70 y.o.   MRN: [id number]  HPI patient presents with painful callus both feet that she cannot cut   Review of Systems     Objective:   Physical Exam Neurovascular status intact with nucleated callus formation    Assessment:     Porokeratotic lesions    Plan:     Debride lesion bilateral with no iatrogenic bleeding noted

## 2016-07-11 ENCOUNTER — Ambulatory Visit: Payer: Self-pay | Admitting: Orthopedic Surgery

## 2016-07-17 NOTE — Patient Instructions (Addendum)
Sherry Barnes  07/17/2016   Your procedure is scheduled on: 07/26/16  Report to [facility] Main  Entrance take [facility]  elevators to 3rd floor to  [place] 10:00 at AM.  Call this number if you have problems the morning of surgery [phone]   Remember: ONLY 1 PERSON MAY GO WITH YOU TO SHORT STAY TO GET  READY MORNING OF [place].  Do not eat food or drink liquids :After Midnight.     Take these medicines the morning of surgery with A SIP OF WATER: TYLENOL IF NEEDED                               You may not have any metal on your body including hair pins and              piercings  Do not wear jewelry, make-up, lotions, powders or perfumes, deodorant             Do not wear nail polish.  Do not shave  48 hours prior to surgery.               Do not bring valuables to the hospital. [place].  Contacts, dentures or bridgework may not be worn into surgery.  Leave suitcase in the car. After surgery it may be brought to your room.                 Please read over the following fact sheets you were given: _____________________________________________________________________             [facility] - Preparing for Surgery Before surgery, you can play an important role.  Because skin is not sterile, your skin needs to be as free of germs as possible.  You can reduce the number of germs on your skin by washing with CHG (chlorahexidine gluconate) soap before surgery.  CHG is an antiseptic cleaner which kills germs and bonds with the skin to continue killing germs even after washing. Please DO NOT use if you have an allergy to CHG or antibacterial soaps.  If your skin becomes reddened/irritated stop using the CHG and inform your nurse when you arrive at Short Stay. Do not shave (including legs and underarms) for at least 48 hours prior to the first CHG shower.  You may shave your face/neck. Please follow  these instructions carefully:  1.  Shower with CHG Soap the night before surgery and the  morning of Surgery.  2.  If you choose to wash your hair, wash your hair first as usual with your  normal  shampoo.  3.  After you shampoo, rinse your hair and body thoroughly to remove the  shampoo.                           4.  Use CHG as you would any other liquid soap.  You can apply chg directly  to the skin and wash                       Gently with a scrungie or clean washcloth.  5.  Apply the CHG Soap to your body ONLY FROM THE NECK  DOWN.   Do not use on face/ open                           Wound or open sores. Avoid contact with eyes, ears mouth and genitals (private parts).                       Wash face,  Genitals (private parts) with your normal soap.             6.  Wash thoroughly, paying special attention to the area where your surgery  will be performed.  7.  Thoroughly rinse your body with warm water from the neck down.  8.  DO NOT shower/wash with your normal soap after using and rinsing off  the CHG Soap.                9.  Pat yourself dry with a clean towel.            10.  Wear clean pajamas.            11.  Place clean sheets on your bed the night of your first shower and do not  sleep with pets. Day of Surgery : Do not apply any lotions/deodorants the morning of surgery.  Please wear clean clothes to the hospital/surgery center.  FAILURE TO FOLLOW THESE INSTRUCTIONS MAY RESULT IN THE CANCELLATION OF YOUR SURGERY PATIENT SIGNATURE_________________________________  NURSE SIGNATURE__________________________________  ________________________________________________________________________  WHAT IS A BLOOD TRANSFUSION? Blood Transfusion Information  A transfusion is the replacement of blood or some of its parts. Blood is made up of multiple cells which provide different functions.  Red blood cells carry oxygen and are used for blood loss replacement.  White blood cells fight  against infection.  Platelets control bleeding.  Plasma helps clot blood.  Other blood products are available for specialized needs, such as hemophilia or other clotting disorders. BEFORE THE TRANSFUSION  Who gives blood for transfusions?   Healthy volunteers who are fully evaluated to make sure their blood is safe. This is blood bank blood. Transfusion therapy is the safest it has ever been in the practice of medicine. Before blood is taken from a donor, a complete history is taken to make sure that person has no history of diseases nor engages in risky social behavior (examples are intravenous drug use or sexual activity with multiple partners). The donor's travel history is screened to minimize risk of transmitting infections, such as malaria. The donated blood is tested for signs of infectious diseases, such as HIV and hepatitis. The blood is then tested to be sure it is compatible with you in order to minimize the chance of a transfusion reaction. If you or a relative donates blood, this is often done in anticipation of surgery and is not appropriate for emergency situations. It takes many days to process the donated blood. RISKS AND COMPLICATIONS Although transfusion therapy is very safe and saves many lives, the main dangers of transfusion include:   Getting an infectious disease.  Developing a transfusion reaction. This is an allergic reaction to something in the blood you were given. Every precaution is taken to prevent this. The decision to have a blood transfusion has been considered carefully by your caregiver before blood is given. Blood is not given unless the benefits outweigh the risks. AFTER THE TRANSFUSION  Right after receiving a blood transfusion, you will usually feel much better and more energetic.  This is especially true if your red blood cells have gotten low (anemic). The transfusion raises the level of the red blood cells which carry oxygen, and this usually causes an  energy increase.  The nurse administering the transfusion will monitor you carefully for complications. HOME CARE INSTRUCTIONS  No special instructions are needed after a transfusion. You may find your energy is better. Speak with your caregiver about any limitations on activity for underlying diseases you may have. SEEK MEDICAL CARE IF:   Your condition is not improving after your transfusion.  You develop redness or irritation at the intravenous (IV) site. SEEK IMMEDIATE MEDICAL CARE IF:  Any of the following symptoms occur over the next 12 hours:  Shaking chills.  You have a temperature by mouth above 102 F (38.9 C), not controlled by medicine.  Chest, back, or muscle pain.  People around you feel you are not acting correctly or are confused.  Shortness of breath or difficulty breathing.  Dizziness and fainting.  You get a rash or develop hives.  You have a decrease in urine output.  Your urine turns a dark color or changes to pink, red, or brown. Any of the following symptoms occur over the next 10 days:  You have a temperature by mouth above 102 F (38.9 C), not controlled by medicine.  Shortness of breath.  Weakness after normal activity.  The white part of the eye turns yellow (jaundice).  You have a decrease in the amount of urine or are urinating less often.  Your urine turns a dark color or changes to pink, red, or brown. Document Released: 06/02/2000 Document Revised: 08/28/2011 Document Reviewed: 01/20/2008 ExitCare Patient Information 2014 [place].  _______________________________________________________________________  Incentive Spirometer  An incentive spirometer is a tool that can help keep your lungs clear and active. This tool measures how well you are filling your lungs with each breath. Taking long deep breaths may help reverse or decrease the chance of developing breathing (pulmonary) problems (especially infection) following:  A  long period of time when you are unable to move or be active. BEFORE THE PROCEDURE   If the spirometer includes an indicator to show your best effort, your nurse or respiratory therapist will set it to a desired goal.  If possible, sit up straight or lean slightly forward. Try not to slouch.  Hold the incentive spirometer in an upright position. INSTRUCTIONS FOR USE  1. Sit on the edge of your bed if possible, or sit up as far as you can in bed or on a chair. 2. Hold the incentive spirometer in an upright position. 3. Breathe out normally. 4. Place the mouthpiece in your mouth and seal your lips tightly around it. 5. Breathe in slowly and as deeply as possible, raising the piston or the ball toward the top of the column. 6. Hold your breath for 3-5 seconds or for as long as possible. Allow the piston or ball to fall to the bottom of the column. 7. Remove the mouthpiece from your mouth and breathe out normally. 8. Rest for a few seconds and repeat Steps 1 through 7 at least 10 times every 1-2 hours when you are awake. Take your time and take a few normal breaths between deep breaths. 9. The spirometer may include an indicator to show your best effort. Use the indicator as a goal to work toward during each repetition. 10. After each set of 10 deep breaths, practice coughing to be sure your lungs are clear.  If you have an incision (the cut made at the time of surgery), support your incision when coughing by placing a pillow or rolled up towels firmly against it. Once you are able to get out of bed, walk around indoors and cough well. You may stop using the incentive spirometer when instructed by your caregiver.  RISKS AND COMPLICATIONS  Take your time so you do not get dizzy or light-headed.  If you are in pain, you may need to take or ask for pain medication before doing incentive spirometry. It is harder to take a deep breath if you are having pain. AFTER USE  Rest and breathe slowly and  easily.  It can be helpful to keep track of a log of your progress. Your caregiver can provide you with a simple table to help with this. If you are using the spirometer at home, follow these instructions: [place] IF:   You are having difficultly using the spirometer.  You have trouble using the spirometer as often as instructed.  Your pain medication is not giving enough relief while using the spirometer.  You develop fever of 100.5 F (38.1 C) or higher. SEEK IMMEDIATE MEDICAL CARE IF:   You cough up bloody sputum that had not been present before.  You develop fever of 102 F (38.9 C) or greater.  You develop worsening pain at or near the incision site. MAKE SURE YOU:   Understand these instructions.  Will watch your condition.  Will get help right away if you are not doing well or get worse. Document Released: 10/16/2006 Document Revised: 08/28/2011 Document Reviewed: 12/17/2006 [facility] Patient Information 2014 [place], [place].   ________________________________________________________________________

## 2016-07-19 ENCOUNTER — Encounter (HOSPITAL_COMMUNITY): Payer: Self-pay

## 2016-07-19 ENCOUNTER — Encounter (HOSPITAL_COMMUNITY)
Admission: RE | Admit: 2016-07-19 | Discharge: 2016-07-19 | Disposition: A | Discharge status: Home / Self Care | Payer: Medicare Other | Source: Ambulatory Visit | Attending: Orthopedic Surgery | Admitting: Orthopedic Surgery

## 2016-07-19 DIAGNOSIS — Z01812 Encounter for preprocedural laboratory examination: Secondary | ICD-10-CM | POA: Diagnosis not present

## 2016-07-19 HISTORY — DX: Dyspnea, unspecified: R06.00

## 2016-07-19 HISTORY — DX: Other specified postprocedural states: Z98.890

## 2016-07-19 HISTORY — DX: Personal history of other diseases of the digestive system: Z87.19

## 2016-07-19 LAB — CBC
HEMATOCRIT: 37.6 % (ref 36.0–46.0)
HEMOGLOBIN: 12.6 g/dL (ref 12.0–15.0)
MCH: 29.1 pg (ref 26.0–34.0)
MCHC: 33.5 g/dL (ref 30.0–36.0)
MCV: 86.8 fL (ref 78.0–100.0)
Platelets: 285 10*3/uL (ref 150–400)
RBC: 4.33 MIL/uL (ref 3.87–5.11)
RDW: 13.5 % (ref 11.5–15.5)
WBC: 7.2 10*3/uL (ref 4.0–10.5)

## 2016-07-19 LAB — SURGICAL PCR SCREEN
MRSA, PCR: NEGATIVE
STAPHYLOCOCCUS AUREUS: NEGATIVE

## 2016-07-19 LAB — PROTIME-INR
INR: 0.93
Prothrombin Time: 12.5 seconds (ref 11.4–15.2)

## 2016-07-19 LAB — COMPREHENSIVE METABOLIC PANEL
ALBUMIN: 4 g/dL (ref 3.5–5.0)
ALT: 20 U/L (ref 14–54)
AST: 22 U/L (ref 15–41)
Alkaline Phosphatase: 62 U/L (ref 38–126)
Anion gap: 6 (ref 5–15)
BILIRUBIN TOTAL: 0.7 mg/dL (ref 0.3–1.2)
BUN: 11 mg/dL (ref 6–20)
CO2: 28 mmol/L (ref 22–32)
CREATININE: 0.67 mg/dL (ref 0.44–1.00)
Calcium: 9.6 mg/dL (ref 8.9–10.3)
Chloride: 105 mmol/L (ref 101–111)
GFR calc Af Amer: >60 mL/min (ref >60)
GFR calc non Af Amer: >60 mL/min (ref >60)
GLUCOSE: 103 mg/dL — AB (ref 65–99)
POTASSIUM: 5.1 mmol/L (ref 3.5–5.1)
Sodium: 139 mmol/L (ref 135–145)
TOTAL PROTEIN: 7 g/dL (ref 6.5–8.1)

## 2016-07-19 LAB — APTT: APTT: 28 s (ref 24–36)

## 2016-07-19 LAB — ABO/RH: ABO/RH(D): O POS

## 2016-07-19 NOTE — Progress Notes (Signed)
Spoke with [facility] Triage nurse at [place]. Regarding pt. Not aware of any bowel prep Sunday Sherry Barnes stated she would follow up with this. Also instructed pt. To call surgeons office for any instructions .

## 2016-07-19 NOTE — Progress Notes (Signed)
EKG 04/11/16 Echo 05/01/16 Stress test 04/20/16 ALL in Epic

## 2016-07-25 ENCOUNTER — Ambulatory Visit: Payer: Self-pay | Admitting: Orthopedic Surgery

## 2016-07-25 NOTE — H&P (Signed)
Mitchel Honour DOB: [birth year] Married / Language: English / Race: White Female Date of Admission:  07/26/2016 CC:  Left knee pain History of Present Illness  The patient is a 71 year old female who comes in  for a preoperative History and Physical. The patient is scheduled for a left unicompartmental knee arthroplasty to be performed by Dr. Dione Plover. Aluisio, MD at [facility] on 07-26-2016. The patient is a 71 year old female who presented for follow up of their knee. The patient is being followed for their left (worse than right) knee pain and osteoarthritis. Symptoms reported include: pain, swelling, aching, catching, instability and difficulty ambulating. The patient feels that they are doing poorly. Current treatment includes: relative rest, NSAIDs (Aleve, prn), pain medications (Tylenol, prn) and icing. The patient has not gotten any relief of their symptoms with NSAIDs (She was given a prescription for Mobic at her last visit. She states that she did not take the prescription at all after reading the possible side effects). Note for "Follow-up Knee": She states that she has had pain, swelling, and tightness in her lower leg on both sides, especially after a recent plane trip in June.  Unfortunately, left knee is getting progressively worse. She no longer is able to do what she desires. The knee gives out on her also. She is not having any swelling. She has not had any locking episodes. Right knee hurts, but to a much lesser degree. Reviewed x-rays from earlier in the spring and she had isolated medial compartment bone on bone in the left knee with intact lateral and patellofemoral compartments. Unfortunately, injections of cortisone and viscosupplements did not help. At this point, the most predictable means of improving pain and function is unicompartmental knee arthroplasty. The procedure, risks, potential complications and rehab course are discussed in detail and the patient elects to  proceed. The goals of this procedure are decreased pain and increased function. There is a high liklihood that both of these goals will be achieved. They have been treated conservatively in the past for the above stated problem and despite conservative measures, they continue to have progressive pain and severe functional limitations and dysfunction. They have failed non-operative management including home exercise, medications, and injections. It is felt that they would benefit from undergoing unicompartmental joint replacement. Risks and benefits of the procedure have been discussed with the patient and they elect to proceed with surgery. There are no active contraindications to surgery such as ongoing infection or rapidly progressive neurological disease.   Problem List/Past Medical  Acute pain of left knee (M25.562)  Complex tear of medial meniscus of left knee as current injury, subsequent encounter [id number]  Peripheral tear of medial meniscus of right knee as current injury, subsequent encounter (S83.221D)  Primary osteoarthritis of right knee (M17.11)   Blood Clot  Cancer  Pre-cancerous vulvar Gastroesophageal Reflux Disease  Hypercholesterolemia  Migraine Headache  Osteoarthritis  Granulomatous Disease Right Lung  Varicose veins  Hemorrhoids  Ventral Hernia  Urinary Incontinence  Degenerative Disc Disease  Menopause  Osteoporosis  Measles  Mumps  Allergies  No Known Drug Allergies  Family History  Cerebrovascular Accident  Maternal Grandfather. Chronic Obstructive Lung Disease  Father. Congestive Heart Failure  Father. First Degree Relatives  reported Heart Disease  Father, Maternal Grandfather, Mother, Paternal Grandfather. Hypertension  child Osteoarthritis  Mother. Severe allergy  Father.  Social History Children  2 Current drinker  08/05/2014: Currently drinks wine only occasionally per week Current work status  retired  Exercise   Exercises weekly; does running / walking, other and gym / weights Living situation  live with spouse Marital status  married No history of drug/alcohol rehab  Not under pain contract  Number of flights of stairs before winded  1 Tobacco / smoke exposure  08/05/2014: no Tobacco use  Former smoker. 08/05/2014: smoke(d) 3/4 pack(s) per day  Medication History Omega 3-6-9 Complex (Oral) Active. Vitamin B6 (200MG  Tablet, Oral) Active. Calcium Citrate (200MG  Tablet, 1 (one) Oral) Active. Aleve (220MG  Tablet, Oral as needed) Active. Aspirin EC (81MG  Tablet DR, Oral) Active. Tylenol (500MG  Capsule, Oral) Active. Vitamin C (500MG  Capsule, Oral) Active. Vitamin D3 (1000UNIT Tablet, Oral) Active. Vitamin B12 TR (1000MCG Tablet ER, Oral) Active.   Past Surgical History Appendectomy  Dilation and Curettage of Uterus  Gallbladder Surgery  laporoscopic Hysterectomy  complete (non-cancerous) Other Orthopaedic Surgery  Tonsillectomy  Tubal Ligation     Review of Systems General Not Present- Chills, Fatigue, Fever, Memory Loss, Night Sweats, Weight Gain and Weight Loss. Skin Not Present- Eczema, Hives, Itching, Lesions and Rash. HEENT Not Present- Dentures, Double Vision, Headache, Hearing Loss, Tinnitus and Visual Loss. Respiratory Present- Shortness of breath with exertion. Not Present- Allergies, Chronic Cough, Coughing up blood and Shortness of breath at rest. Cardiovascular Present- Swelling. Not Present- Chest Pain, Difficulty Breathing Lying Down, Murmur, Palpitations and Racing/skipping heartbeats. Gastrointestinal Not Present- Abdominal Pain, Bloody Stool, Constipation, Diarrhea, Difficulty Swallowing, Heartburn, Jaundice, Loss of appetitie, Nausea and Vomiting. Female Genitourinary Not Present- Blood in Urine, Discharge, Flank Pain, Incontinence, Painful Urination, Urgency, Urinary frequency, Urinary Retention, Urinating at Night and Weak urinary  stream. Musculoskeletal Present- Back Pain, Morning Stiffness and Muscle Weakness. Not Present- Joint Pain, Joint Swelling, Muscle Pain and Spasms. Neurological Not Present- Blackout spells, Difficulty with balance, Dizziness, Paralysis, Tremor and Weakness. Psychiatric Not Present- Insomnia.  Vitals Weight: 200 lb Height: 62.25in Weight was reported by patient. Height was reported by patient. Body Surface Area: 1.92 m Body Mass Index: 36.29 kg/m  Pulse: 76 (Regular)  BP: 144/86 (Sitting, Right Arm, Standard)   Physical Exam General Mental Status -Alert, cooperative and good historian. General Appearance-pleasant, Not in acute distress. Orientation-Oriented X3. Build & Nutrition-Well nourished and Well developed.  Head and Neck Head-normocephalic, atraumatic . Neck Global Assessment - supple, no bruit auscultated on the right, no bruit auscultated on the left.  Eye Vision-Wears corrective lenses. Pupil - Bilateral-Regular and Round. Motion - Bilateral-EOMI.  Chest and Lung Exam Auscultation Breath sounds - clear at anterior chest wall and clear at posterior chest wall. Adventitious sounds - No Adventitious sounds.  Cardiovascular Auscultation Rhythm - Regular rate and rhythm. Heart Sounds - S1 WNL and S2 WNL. Murmurs & Other Heart Sounds - Auscultation of the heart reveals - No Murmurs.  Abdomen Palpation/Percussion Tenderness - Abdomen is non-tender to palpation. Rigidity (guarding) - Abdomen is soft. Auscultation Auscultation of the abdomen reveals - Bowel sounds normal.  Female Genitourinary Note: Not done, not pertinent to present illness   Musculoskeletal Note: She is alert and oriented, no apparent distress. Her right knee, no swelling. Range about 0 to 125 with some slight tenderness medially, no lateral tenderness or instability. Left knee, no effusion. Range 0 to 125. Tenderness medial greater than lateral with no  instability.  RADIOGRAPHS Reviewed x-rays from earlier in the spring and she had isolated medial compartment bone on bone in the left knee with intact lateral and patellofemoral compartments.   Assessment & Plan Primary osteoarthritis of right knee (M17.11) Primary osteoarthritis  of left knee (M17.12) Current Plans Prescription for Physical Therapy given. Patient instructed to schedule therapy either through [facility] or their preferred physical therapy provider. Note:Surgical Plans: Left Right Total Knee Hip Replacement - Anterior Approach  Disposition: Home and straight to oupatient therapy at [facility]  PCP: Dr. Deland Pretty  Topical TXA  Anesthesia Issues: Some nausea following anesthesia  Patient was instructed on what medications to stop prior to surgery.  Signed electronically by Ok Edwards, III PA-C

## 2016-07-26 ENCOUNTER — Ambulatory Visit (HOSPITAL_COMMUNITY): Payer: Medicare Other | Admitting: Anesthesiology

## 2016-07-26 ENCOUNTER — Encounter (HOSPITAL_COMMUNITY)
Admission: RE | Disposition: A | Discharge status: Home / Self Care | Payer: Self-pay | Source: Ambulatory Visit | Attending: Orthopedic Surgery

## 2016-07-26 ENCOUNTER — Encounter (HOSPITAL_COMMUNITY): Payer: Self-pay | Admitting: *Deleted

## 2016-07-26 ENCOUNTER — Observation Stay (HOSPITAL_COMMUNITY)
Admission: RE | Admit: 2016-07-26 | Discharge: 2016-07-27 | Disposition: A | Discharge status: Home / Self Care | Payer: Medicare Other | Source: Ambulatory Visit | Attending: Orthopedic Surgery | Admitting: Orthopedic Surgery

## 2016-07-26 DIAGNOSIS — M810 Age-related osteoporosis without current pathological fracture: Secondary | ICD-10-CM | POA: Insufficient documentation

## 2016-07-26 DIAGNOSIS — Z87891 Personal history of nicotine dependence: Secondary | ICD-10-CM | POA: Diagnosis not present

## 2016-07-26 DIAGNOSIS — R197 Diarrhea, unspecified: Secondary | ICD-10-CM | POA: Diagnosis not present

## 2016-07-26 DIAGNOSIS — Z96643 Presence of artificial hip joint, bilateral: Secondary | ICD-10-CM | POA: Insufficient documentation

## 2016-07-26 DIAGNOSIS — K219 Gastro-esophageal reflux disease without esophagitis: Secondary | ICD-10-CM | POA: Insufficient documentation

## 2016-07-26 DIAGNOSIS — M8588 Other specified disorders of bone density and structure, other site: Secondary | ICD-10-CM | POA: Diagnosis not present

## 2016-07-26 DIAGNOSIS — M85861 Other specified disorders of bone density and structure, right lower leg: Secondary | ICD-10-CM | POA: Insufficient documentation

## 2016-07-26 DIAGNOSIS — Z86718 Personal history of other venous thrombosis and embolism: Secondary | ICD-10-CM | POA: Insufficient documentation

## 2016-07-26 DIAGNOSIS — J8410 Pulmonary fibrosis, unspecified: Secondary | ICD-10-CM | POA: Insufficient documentation

## 2016-07-26 DIAGNOSIS — M170 Bilateral primary osteoarthritis of knee: Principal | ICD-10-CM | POA: Insufficient documentation

## 2016-07-26 DIAGNOSIS — M25762 Osteophyte, left knee: Secondary | ICD-10-CM | POA: Insufficient documentation

## 2016-07-26 DIAGNOSIS — G8918 Other acute postprocedural pain: Secondary | ICD-10-CM | POA: Diagnosis not present

## 2016-07-26 DIAGNOSIS — Z7901 Long term (current) use of anticoagulants: Secondary | ICD-10-CM | POA: Diagnosis not present

## 2016-07-26 DIAGNOSIS — M179 Osteoarthritis of knee, unspecified: Secondary | ICD-10-CM | POA: Diagnosis present

## 2016-07-26 DIAGNOSIS — S83242A Other tear of medial meniscus, current injury, left knee, initial encounter: Secondary | ICD-10-CM | POA: Diagnosis not present

## 2016-07-26 DIAGNOSIS — E7800 Pure hypercholesterolemia, unspecified: Secondary | ICD-10-CM | POA: Insufficient documentation

## 2016-07-26 DIAGNOSIS — M1710 Unilateral primary osteoarthritis, unspecified knee: Secondary | ICD-10-CM | POA: Diagnosis present

## 2016-07-26 DIAGNOSIS — M1712 Unilateral primary osteoarthritis, left knee: Secondary | ICD-10-CM | POA: Diagnosis not present

## 2016-07-26 HISTORY — PX: PARTIAL KNEE ARTHROPLASTY: SHX2174

## 2016-07-26 LAB — TYPE AND SCREEN
ABO/RH(D): O POS
ANTIBODY SCREEN: NEGATIVE

## 2016-07-26 SURGERY — ARTHROPLASTY, KNEE, UNICOMPARTMENTAL
Anesthesia: Spinal | Site: Knee | Laterality: Left

## 2016-07-26 MED ORDER — PROPOFOL 10 MG/ML IV BOLUS
INTRAVENOUS | Status: AC
  Filled 2016-07-26: qty 20

## 2016-07-26 MED ORDER — ACETAMINOPHEN 10 MG/ML IV SOLN
1000.0000 mg | Freq: Once | INTRAVENOUS | Status: AC
  Administered 2016-07-26: 1000 mg via INTRAVENOUS
  Filled 2016-07-26: qty 100

## 2016-07-26 MED ORDER — BISACODYL 10 MG RE SUPP: 10.0000 mg | Freq: Every day | RECTAL | Status: DC | PRN

## 2016-07-26 MED ORDER — CEFAZOLIN SODIUM-DEXTROSE 2-4 GM/100ML-% IV SOLN
2.0000 g | Freq: Four times a day (QID) | INTRAVENOUS | Status: AC
  Administered 2016-07-26 – 2016-07-27 (×2): 2 g via INTRAVENOUS
  Filled 2016-07-26 (×2): qty 100

## 2016-07-26 MED ORDER — FENTANYL CITRATE (PF) 100 MCG/2ML IJ SOLN: 25.0000 ug | INTRAMUSCULAR | Status: DC | PRN

## 2016-07-26 MED ORDER — MEPERIDINE HCL 50 MG/ML IJ SOLN: 6.2500 mg | INTRAMUSCULAR | Status: DC | PRN

## 2016-07-26 MED ORDER — SODIUM CHLORIDE 0.9 % IV SOLN
INTRAVENOUS | Status: DC
  Administered 2016-07-26: 17:00:00 via INTRAVENOUS

## 2016-07-26 MED ORDER — EPHEDRINE SULFATE 50 MG/ML IJ SOLN
INTRAMUSCULAR | Status: DC | PRN
  Administered 2016-07-26 (×2): 5 mg via INTRAVENOUS

## 2016-07-26 MED ORDER — BUPIVACAINE LIPOSOME 1.3 % IJ SUSP
INTRAMUSCULAR | Status: DC | PRN
  Administered 2016-07-26: 20 mL

## 2016-07-26 MED ORDER — OXYCODONE HCL 5 MG PO TABS
5.0000 mg | ORAL_TABLET | ORAL | Status: DC | PRN
  Administered 2016-07-26 (×3): 5 mg via ORAL
  Administered 2016-07-27 (×4): 10 mg via ORAL
  Filled 2016-07-26: qty 1
  Filled 2016-07-26 (×5): qty 2

## 2016-07-26 MED ORDER — METOCLOPRAMIDE HCL 5 MG PO TABS: 5.0000 mg | ORAL_TABLET | Freq: Three times a day (TID) | ORAL | Status: DC | PRN

## 2016-07-26 MED ORDER — FENTANYL CITRATE (PF) 100 MCG/2ML IJ SOLN
INTRAMUSCULAR | Status: AC
  Filled 2016-07-26: qty 2

## 2016-07-26 MED ORDER — ONDANSETRON HCL 4 MG PO TABS: 4.0000 mg | ORAL_TABLET | Freq: Four times a day (QID) | ORAL | Status: DC | PRN

## 2016-07-26 MED ORDER — SODIUM CHLORIDE 0.9 % IJ SOLN
INTRAMUSCULAR | Status: DC | PRN
  Administered 2016-07-26: 30 mL

## 2016-07-26 MED ORDER — DOCUSATE SODIUM 100 MG PO CAPS: 100.0000 mg | ORAL_CAPSULE | Freq: Two times a day (BID) | ORAL | Status: DC

## 2016-07-26 MED ORDER — TRANEXAMIC ACID 1000 MG/10ML IV SOLN
2000.0000 mg | Freq: Once | INTRAVENOUS | Status: DC
  Filled 2016-07-26: qty 20

## 2016-07-26 MED ORDER — BUPIVACAINE IN DEXTROSE 0.75-8.25 % IT SOLN
INTRATHECAL | Status: DC | PRN
  Administered 2016-07-26: 2 mL via INTRATHECAL

## 2016-07-26 MED ORDER — LACTATED RINGERS IV SOLN
INTRAVENOUS | Status: DC
  Administered 2016-07-26 (×2): via INTRAVENOUS

## 2016-07-26 MED ORDER — RIVAROXABAN 10 MG PO TABS
10.0000 mg | ORAL_TABLET | Freq: Every day | ORAL | Status: DC
  Administered 2016-07-27: 10 mg via ORAL
  Filled 2016-07-26: qty 1

## 2016-07-26 MED ORDER — ONDANSETRON HCL 4 MG/2ML IJ SOLN
INTRAMUSCULAR | Status: AC
  Filled 2016-07-26: qty 2

## 2016-07-26 MED ORDER — SODIUM CHLORIDE 0.9 % IJ SOLN
INTRAMUSCULAR | Status: AC
  Filled 2016-07-26: qty 50

## 2016-07-26 MED ORDER — CEFAZOLIN SODIUM-DEXTROSE 2-4 GM/100ML-% IV SOLN
INTRAVENOUS | Status: AC
  Filled 2016-07-26: qty 100

## 2016-07-26 MED ORDER — DIPHENHYDRAMINE HCL 12.5 MG/5ML PO ELIX: 12.5000 mg | ORAL_SOLUTION | ORAL | Status: DC | PRN

## 2016-07-26 MED ORDER — CHLORHEXIDINE GLUCONATE 4 % EX LIQD: 60.0000 mL | Freq: Once | CUTANEOUS | Status: DC

## 2016-07-26 MED ORDER — METOCLOPRAMIDE HCL 5 MG/ML IJ SOLN: 10.0000 mg | Freq: Once | INTRAMUSCULAR | Status: DC | PRN

## 2016-07-26 MED ORDER — METOCLOPRAMIDE HCL 5 MG/ML IJ SOLN: 5.0000 mg | Freq: Three times a day (TID) | INTRAMUSCULAR | Status: DC | PRN

## 2016-07-26 MED ORDER — ACETAMINOPHEN 325 MG PO TABS: 650.0000 mg | ORAL_TABLET | Freq: Four times a day (QID) | ORAL | Status: DC | PRN

## 2016-07-26 MED ORDER — FENTANYL CITRATE (PF) 100 MCG/2ML IJ SOLN
50.0000 ug | INTRAMUSCULAR | Status: DC | PRN
  Administered 2016-07-26: 100 ug via INTRAVENOUS

## 2016-07-26 MED ORDER — POLYETHYLENE GLYCOL 3350 17 G PO PACK: 17.0000 g | PACK | Freq: Every day | ORAL | Status: DC | PRN

## 2016-07-26 MED ORDER — METHOCARBAMOL 1000 MG/10ML IJ SOLN
500.0000 mg | Freq: Four times a day (QID) | INTRAVENOUS | Status: DC | PRN
  Filled 2016-07-26: qty 5

## 2016-07-26 MED ORDER — ONDANSETRON HCL 4 MG/2ML IJ SOLN
INTRAMUSCULAR | Status: DC | PRN
  Administered 2016-07-26: 4 mg via INTRAVENOUS

## 2016-07-26 MED ORDER — MIDAZOLAM HCL 2 MG/2ML IJ SOLN
INTRAMUSCULAR | Status: AC
  Filled 2016-07-26: qty 2

## 2016-07-26 MED ORDER — TRANEXAMIC ACID 1000 MG/10ML IV SOLN
INTRAVENOUS | Status: DC | PRN
  Administered 2016-07-26: 2000 mg via TOPICAL

## 2016-07-26 MED ORDER — MORPHINE SULFATE (PF) 2 MG/ML IV SOLN: 1.0000 mg | INTRAVENOUS | Status: DC | PRN

## 2016-07-26 MED ORDER — ROPIVACAINE HCL 7.5 MG/ML IJ SOLN
INTRAMUSCULAR | Status: DC | PRN
  Administered 2016-07-26: 20 mL via PERINEURAL

## 2016-07-26 MED ORDER — CEFAZOLIN SODIUM-DEXTROSE 2-4 GM/100ML-% IV SOLN
2.0000 g | INTRAVENOUS | Status: AC
  Administered 2016-07-26: 2 g via INTRAVENOUS
  Filled 2016-07-26: qty 100

## 2016-07-26 MED ORDER — EPHEDRINE 5 MG/ML INJ
INTRAVENOUS | Status: AC
  Filled 2016-07-26: qty 10

## 2016-07-26 MED ORDER — TRAMADOL HCL 50 MG PO TABS
50.0000 mg | ORAL_TABLET | Freq: Four times a day (QID) | ORAL | Status: DC | PRN
  Administered 2016-07-27: 100 mg via ORAL
  Filled 2016-07-26: qty 2

## 2016-07-26 MED ORDER — MIDAZOLAM HCL 5 MG/5ML IJ SOLN
INTRAMUSCULAR | Status: DC | PRN
  Administered 2016-07-26 (×2): 1 mg via INTRAVENOUS

## 2016-07-26 MED ORDER — MIDAZOLAM HCL 2 MG/2ML IJ SOLN
1.0000 mg | INTRAMUSCULAR | Status: DC | PRN
  Administered 2016-07-26: 1 mg via INTRAVENOUS
  Filled 2016-07-26: qty 2

## 2016-07-26 MED ORDER — ACETAMINOPHEN 10 MG/ML IV SOLN
INTRAVENOUS | Status: AC
  Filled 2016-07-26: qty 100

## 2016-07-26 MED ORDER — DEXAMETHASONE SODIUM PHOSPHATE 10 MG/ML IJ SOLN
10.0000 mg | Freq: Once | INTRAMUSCULAR | Status: AC
  Administered 2016-07-26: 10 mg via INTRAVENOUS

## 2016-07-26 MED ORDER — DEXAMETHASONE SODIUM PHOSPHATE 10 MG/ML IJ SOLN
10.0000 mg | Freq: Once | INTRAMUSCULAR | Status: AC
  Administered 2016-07-27: 09:00:00 10 mg via INTRAVENOUS
  Filled 2016-07-26: qty 1

## 2016-07-26 MED ORDER — MENTHOL 3 MG MT LOZG: 1.0000 | LOZENGE | OROMUCOSAL | Status: DC | PRN

## 2016-07-26 MED ORDER — ROPIVACAINE HCL 7.5 MG/ML IJ SOLN
INTRAMUSCULAR | Status: AC
  Filled 2016-07-26: qty 20

## 2016-07-26 MED ORDER — BUPIVACAINE LIPOSOME 1.3 % IJ SUSP
20.0000 mL | Freq: Once | INTRAMUSCULAR | Status: DC
  Filled 2016-07-26: qty 20

## 2016-07-26 MED ORDER — METHOCARBAMOL 500 MG PO TABS
500.0000 mg | ORAL_TABLET | Freq: Four times a day (QID) | ORAL | Status: DC | PRN
  Administered 2016-07-26 – 2016-07-27 (×2): 500 mg via ORAL
  Filled 2016-07-26 (×2): qty 1

## 2016-07-26 MED ORDER — PHENOL 1.4 % MT LIQD: 1.0000 | OROMUCOSAL | Status: DC | PRN

## 2016-07-26 MED ORDER — ACETAMINOPHEN 500 MG PO TABS
1000.0000 mg | ORAL_TABLET | Freq: Four times a day (QID) | ORAL | Status: AC
  Administered 2016-07-26 – 2016-07-27 (×4): 1000 mg via ORAL
  Filled 2016-07-26 (×4): qty 2

## 2016-07-26 MED ORDER — ACETAMINOPHEN 650 MG RE SUPP: 650.0000 mg | Freq: Four times a day (QID) | RECTAL | Status: DC | PRN

## 2016-07-26 MED ORDER — SODIUM CHLORIDE 0.9 % IR SOLN
Status: DC | PRN
  Administered 2016-07-26: 1000 mL

## 2016-07-26 MED ORDER — DOCUSATE SODIUM 100 MG PO CAPS
100.0000 mg | ORAL_CAPSULE | Freq: Two times a day (BID) | ORAL | Status: DC
  Administered 2016-07-26 – 2016-07-27 (×2): 100 mg via ORAL
  Filled 2016-07-26 (×3): qty 1

## 2016-07-26 MED ORDER — ONDANSETRON HCL 4 MG/2ML IJ SOLN: 4.0000 mg | Freq: Four times a day (QID) | INTRAMUSCULAR | Status: DC | PRN

## 2016-07-26 MED ORDER — 0.9 % SODIUM CHLORIDE (POUR BTL) OPTIME
TOPICAL | Status: DC | PRN
  Administered 2016-07-26: 1000 mL

## 2016-07-26 MED ORDER — FLEET ENEMA 7-19 GM/118ML RE ENEM: 1.0000 | ENEMA | Freq: Once | RECTAL | Status: DC | PRN

## 2016-07-26 SURGICAL SUPPLY — 47 items
BAG DECANTER FOR FLEXI CONT (MISCELLANEOUS) ×3 IMPLANT
BAG ZIPLOCK 12X15 (MISCELLANEOUS) IMPLANT
BANDAGE ACE 6X5 VEL STRL LF (GAUZE/BANDAGES/DRESSINGS) ×3 IMPLANT
BLADE SAW RECIPROCATING 77.5 (BLADE) ×3 IMPLANT
BLADE SAW SGTL 13.0X1.19X90.0M (BLADE) ×3 IMPLANT
BOWL SMART MIX CTS (DISPOSABLE) ×3 IMPLANT
BUR OVAL CARBIDE 4.0 (BURR) ×3 IMPLANT
CAPT KNEE PARTIAL 2 ×4 IMPLANT
CEMENT HV SMART SET (Cement) ×3 IMPLANT
CLOSURE WOUND 1/2 X4 (GAUZE/BANDAGES/DRESSINGS) ×2
CLOTH BEACON ORANGE TIMEOUT ST (SAFETY) ×3 IMPLANT
CUFF TOURN SGL QUICK 34 (TOURNIQUET CUFF) ×2
CUFF TRNQT CYL 34X4X40X1 (TOURNIQUET CUFF) ×1 IMPLANT
DRSG ADAPTIC 3X8 NADH LF (GAUZE/BANDAGES/DRESSINGS) ×3 IMPLANT
DURAPREP 26ML APPLICATOR (WOUND CARE) ×3 IMPLANT
ELECT REM PT RETURN 9FT ADLT (ELECTROSURGICAL) ×3
ELECTRODE REM PT RTRN 9FT ADLT (ELECTROSURGICAL) ×1 IMPLANT
EVACUATOR 1/8 PVC DRAIN (DRAIN) ×3 IMPLANT
GAUZE SPONGE 4X4 12PLY STRL (GAUZE/BANDAGES/DRESSINGS) ×3 IMPLANT
GLOVE BIO SURGEON STRL SZ7.5 (GLOVE) ×3 IMPLANT
GLOVE BIO SURGEON STRL SZ8 (GLOVE) ×3 IMPLANT
GLOVE BIO SURGEON STRL SZ8.5 (GLOVE) ×3 IMPLANT
GLOVE BIOGEL PI IND STRL 7.5 (GLOVE) ×4 IMPLANT
GLOVE BIOGEL PI IND STRL 8 (GLOVE) ×2 IMPLANT
GLOVE BIOGEL PI IND STRL 8.5 (GLOVE) ×1 IMPLANT
GLOVE BIOGEL PI INDICATOR 7.5 (GLOVE) ×8
GLOVE BIOGEL PI INDICATOR 8 (GLOVE) ×4
GLOVE BIOGEL PI INDICATOR 8.5 (GLOVE) ×2
GOWN STRL REUS W/TWL LRG LVL3 (GOWN DISPOSABLE) ×6 IMPLANT
GOWN STRL REUS W/TWL XL LVL3 (GOWN DISPOSABLE) ×6 IMPLANT
HANDPIECE INTERPULSE COAX TIP (DISPOSABLE) ×2
IMMOBILIZER KNEE 20 (SOFTGOODS) ×3
IMMOBILIZER KNEE 20 THIGH 36 (SOFTGOODS) ×1 IMPLANT
KIT IMPL STRL TIB IPOLY IUNI ×2 IMPLANT
MANIFOLD NEPTUNE II (INSTRUMENTS) ×3 IMPLANT
PACK TOTAL KNEE CUSTOM (KITS) ×3 IMPLANT
PAD ABD 8X10 STRL (GAUZE/BANDAGES/DRESSINGS) ×3 IMPLANT
PADDING CAST COTTON 6X4 STRL (CAST SUPPLIES) ×9 IMPLANT
POSITIONER SURGICAL ARM (MISCELLANEOUS) ×3 IMPLANT
SET HNDPC FAN SPRY TIP SCT (DISPOSABLE) ×1 IMPLANT
STRIP CLOSURE SKIN 1/2X4 (GAUZE/BANDAGES/DRESSINGS) ×4 IMPLANT
SUT MNCRL AB 4-0 PS2 18 (SUTURE) ×3 IMPLANT
SUT VIC AB 2-0 CT1 27 (SUTURE) ×4
SUT VIC AB 2-0 CT1 TAPERPNT 27 (SUTURE) ×2 IMPLANT
SUT VLOC 180 0 24IN GS25 (SUTURE) ×3 IMPLANT
SYR 50ML LL SCALE MARK (SYRINGE) ×3 IMPLANT
WRAP KNEE MAXI GEL POST OP (GAUZE/BANDAGES/DRESSINGS) ×3 IMPLANT

## 2016-07-26 NOTE — H&P (View-Only) (Signed)
Sherry Barnes DOB: [birth year] Married / Language: English / Race: White Female Date of Admission:  07/26/2016 CC:  Left knee pain History of Present Illness  The patient is a 71 year old female who comes in  for a preoperative History and Physical. The patient is scheduled for a left unicompartmental knee arthroplasty to be performed by Dr. Dione Plover. Aluisio, MD at [facility] on 07-26-2016. The patient is a 71 year old female who presented for follow up of their knee. The patient is being followed for their left (worse than right) knee pain and osteoarthritis. Symptoms reported include: pain, swelling, aching, catching, instability and difficulty ambulating. The patient feels that they are doing poorly. Current treatment includes: relative rest, NSAIDs (Aleve, prn), pain medications (Tylenol, prn) and icing. The patient has not gotten any relief of their symptoms with NSAIDs (She was given a prescription for Mobic at her last visit. She states that she did not take the prescription at all after reading the possible side effects). Note for "Follow-up Knee": She states that she has had pain, swelling, and tightness in her lower leg on both sides, especially after a recent plane trip in June.  Unfortunately, left knee is getting progressively worse. She no longer is able to do what she desires. The knee gives out on her also. She is not having any swelling. She has not had any locking episodes. Right knee hurts, but to a much lesser degree. Reviewed x-rays from earlier in the spring and she had isolated medial compartment bone on bone in the left knee with intact lateral and patellofemoral compartments. Unfortunately, injections of cortisone and viscosupplements did not help. At this point, the most predictable means of improving pain and function is unicompartmental knee arthroplasty. The procedure, risks, potential complications and rehab course are discussed in detail and the patient elects to  proceed. The goals of this procedure are decreased pain and increased function. There is a high liklihood that both of these goals will be achieved. They have been treated conservatively in the past for the above stated problem and despite conservative measures, they continue to have progressive pain and severe functional limitations and dysfunction. They have failed non-operative management including home exercise, medications, and injections. It is felt that they would benefit from undergoing unicompartmental joint replacement. Risks and benefits of the procedure have been discussed with the patient and they elect to proceed with surgery. There are no active contraindications to surgery such as ongoing infection or rapidly progressive neurological disease.   Problem List/Past Medical  Acute pain of left knee (M25.562)  Complex tear of medial meniscus of left knee as current injury, subsequent encounter [id number]  Peripheral tear of medial meniscus of right knee as current injury, subsequent encounter (S83.221D)  Primary osteoarthritis of right knee (M17.11)   Blood Clot  Cancer  Pre-cancerous vulvar Gastroesophageal Reflux Disease  Hypercholesterolemia  Migraine Headache  Osteoarthritis  Granulomatous Disease Right Lung  Varicose veins  Hemorrhoids  Ventral Hernia  Urinary Incontinence  Degenerative Disc Disease  Menopause  Osteoporosis  Measles  Mumps  Allergies  No Known Drug Allergies  Family History  Cerebrovascular Accident  Maternal Grandfather. Chronic Obstructive Lung Disease  Father. Congestive Heart Failure  Father. First Degree Relatives  reported Heart Disease  Father, Maternal Grandfather, Mother, Paternal Grandfather. Hypertension  child Osteoarthritis  Mother. Severe allergy  Father.  Social History Children  2 Current drinker  08/05/2014: Currently drinks wine only occasionally per week Current work status  retired  Exercise   Exercises weekly; does running / walking, other and gym / weights Living situation  live with spouse Marital status  married No history of drug/alcohol rehab  Not under pain contract  Number of flights of stairs before winded  1 Tobacco / smoke exposure  08/05/2014: no Tobacco use  Former smoker. 08/05/2014: smoke(d) 3/4 pack(s) per day  Medication History Omega 3-6-9 Complex (Oral) Active. Vitamin B6 (200MG  Tablet, Oral) Active. Calcium Citrate (200MG  Tablet, 1 (one) Oral) Active. Aleve (220MG  Tablet, Oral as needed) Active. Aspirin EC (81MG  Tablet DR, Oral) Active. Tylenol (500MG  Capsule, Oral) Active. Vitamin C (500MG  Capsule, Oral) Active. Vitamin D3 (1000UNIT Tablet, Oral) Active. Vitamin B12 TR (1000MCG Tablet ER, Oral) Active.   Past Surgical History Appendectomy  Dilation and Curettage of Uterus  Gallbladder Surgery  laporoscopic Hysterectomy  complete (non-cancerous) Other Orthopaedic Surgery  Tonsillectomy  Tubal Ligation     Review of Systems General Not Present- Chills, Fatigue, Fever, Memory Loss, Night Sweats, Weight Gain and Weight Loss. Skin Not Present- Eczema, Hives, Itching, Lesions and Rash. HEENT Not Present- Dentures, Double Vision, Headache, Hearing Loss, Tinnitus and Visual Loss. Respiratory Present- Shortness of breath with exertion. Not Present- Allergies, Chronic Cough, Coughing up blood and Shortness of breath at rest. Cardiovascular Present- Swelling. Not Present- Chest Pain, Difficulty Breathing Lying Down, Murmur, Palpitations and Racing/skipping heartbeats. Gastrointestinal Not Present- Abdominal Pain, Bloody Stool, Constipation, Diarrhea, Difficulty Swallowing, Heartburn, Jaundice, Loss of appetitie, Nausea and Vomiting. Female Genitourinary Not Present- Blood in Urine, Discharge, Flank Pain, Incontinence, Painful Urination, Urgency, Urinary frequency, Urinary Retention, Urinating at Night and Weak urinary  stream. Musculoskeletal Present- Back Pain, Morning Stiffness and Muscle Weakness. Not Present- Joint Pain, Joint Swelling, Muscle Pain and Spasms. Neurological Not Present- Blackout spells, Difficulty with balance, Dizziness, Paralysis, Tremor and Weakness. Psychiatric Not Present- Insomnia.  Vitals Weight: 200 lb Height: 62.25in Weight was reported by patient. Height was reported by patient. Body Surface Area: 1.92 m Body Mass Index: 36.29 kg/m  Pulse: 76 (Regular)  BP: 144/86 (Sitting, Right Arm, Standard)   Physical Exam General Mental Status -Alert, cooperative and good historian. General Appearance-pleasant, Not in acute distress. Orientation-Oriented X3. Build & Nutrition-Well nourished and Well developed.  Head and Neck Head-normocephalic, atraumatic . Neck Global Assessment - supple, no bruit auscultated on the right, no bruit auscultated on the left.  Eye Vision-Wears corrective lenses. Pupil - Bilateral-Regular and Round. Motion - Bilateral-EOMI.  Chest and Lung Exam Auscultation Breath sounds - clear at anterior chest wall and clear at posterior chest wall. Adventitious sounds - No Adventitious sounds.  Cardiovascular Auscultation Rhythm - Regular rate and rhythm. Heart Sounds - S1 WNL and S2 WNL. Murmurs & Other Heart Sounds - Auscultation of the heart reveals - No Murmurs.  Abdomen Palpation/Percussion Tenderness - Abdomen is non-tender to palpation. Rigidity (guarding) - Abdomen is soft. Auscultation Auscultation of the abdomen reveals - Bowel sounds normal.  Female Genitourinary Note: Not done, not pertinent to present illness   Musculoskeletal Note: She is alert and oriented, no apparent distress. Her right knee, no swelling. Range about 0 to 125 with some slight tenderness medially, no lateral tenderness or instability. Left knee, no effusion. Range 0 to 125. Tenderness medial greater than lateral with no  instability.  RADIOGRAPHS Reviewed x-rays from earlier in the spring and she had isolated medial compartment bone on bone in the left knee with intact lateral and patellofemoral compartments.   Assessment & Plan Primary osteoarthritis of right knee (M17.11) Primary osteoarthritis  of left knee (M17.12) Current Plans Prescription for Physical Therapy given. Patient instructed to schedule therapy either through [facility] or their preferred physical therapy provider. Note:Surgical Plans: Left Right Total Knee Hip Replacement - Anterior Approach  Disposition: Home and straight to oupatient therapy at [place]  PCP: Dr. Deland Pretty  Topical TXA  Anesthesia Issues: Some nausea following anesthesia  Patient was instructed on what medications to stop prior to surgery.  Signed electronically by Ok Edwards, III PA-C

## 2016-07-26 NOTE — Anesthesia Procedure Notes (Addendum)
Anesthesia Regional Block:  Adductor canal block  Pre-Anesthetic Checklist: ,, timeout performed, Correct Patient, Correct Site, Correct Laterality, Correct Procedure, Correct Position, site marked, Risks and benefits discussed,  Surgical consent,  Pre-op evaluation,  At surgeon's request and post-op pain management  Laterality: Left and Lower  Prep: Maximum Sterile Barrier Precautions used, chloraprep       Needles:  Injection technique: Single-shot  Needle Type: Echogenic Stimulator Needle     Needle Length: 10cm 10 cm Needle Gauge: 21 G    Additional Needles:  Procedures: ultrasound guided (picture in chart) Adductor canal block Narrative:  Start time: 07/26/2016 11:57 AM End time: 07/26/2016 12:07 PM Injection made incrementally with aspirations every 5 mL.  Performed by: Personally  Anesthesiologist: Montez Hageman  Additional Notes: Risks, benefits and alternative to block explained extensively.  Patient tolerated procedure well, without complications.

## 2016-07-26 NOTE — Anesthesia Postprocedure Evaluation (Signed)
Anesthesia Post Note  Patient: ANMARIE GHARIBIAN  Procedure(s) Performed: Procedure(s) (LRB): LEFT MEDIAL UNICOMPARTMENTAL KNEE ARTHROPLASTY (Left)  Patient location during evaluation: PACU Anesthesia Type: Spinal and Regional Level of consciousness: awake and alert Pain management: pain level controlled Vital Signs Assessment: post-procedure vital signs reviewed and stable Respiratory status: spontaneous breathing and respiratory function stable Cardiovascular status: blood pressure returned to baseline and stable Postop Assessment: no headache, no backache and spinal receding Anesthetic complications: no       Last Vitals:  Vitals:   07/26/16 1430 07/26/16 1445  BP: 122/66 119/63  Pulse: 73 73  Resp: 13 15  Temp:      Last Pain:  Vitals:   07/26/16 1406  TempSrc:   PainSc: 0-No pain                 Montez Hageman

## 2016-07-26 NOTE — Transfer of Care (Signed)
Immediate Anesthesia Transfer of Care Note  Patient: Sherry Barnes  Procedure(s) Performed: Procedure(s) with comments: LEFT MEDIAL UNICOMPARTMENTAL KNEE ARTHROPLASTY (Left) - Adductor Block  Patient Location: PACU  Anesthesia Type:MAC and Spinal  Level of Consciousness: awake, alert  and oriented  Airway & Oxygen Therapy: Patient Spontanous Breathing and Patient connected to nasal cannula oxygen  Post-op Assessment: Report given to RN and Post -op Vital signs reviewed and stable  Post vital signs: Reviewed and stable  Last Vitals:  Vitals:   07/26/16 1222 07/26/16 1223  BP:    Pulse: 80 80  Resp: 10 10  Temp:      Last Pain:  Vitals:   07/26/16 1011  TempSrc:   PainSc: 3       Patients Stated Pain Goal: 4 (61/53/79 [id number])  Complications: No apparent anesthesia complications

## 2016-07-26 NOTE — Anesthesia Preprocedure Evaluation (Signed)
Anesthesia Evaluation  Patient identified by MRN, date of birth, ID band Patient awake    Reviewed: Allergy & Precautions, NPO status , Patient's Chart, lab work & pertinent test results  History of Anesthesia Complications (+) PONV  Airway Mallampati: II  TM Distance: >3 FB Neck ROM: Full    Dental no notable dental hx.    Pulmonary former smoker,    Pulmonary exam normal breath sounds clear to auscultation       Cardiovascular + DVT  Normal cardiovascular exam Rhythm:Regular Rate:Normal     Neuro/Psych negative neurological ROS  negative psych ROS   GI/Hepatic negative GI ROS, Neg liver ROS,   Endo/Other  negative endocrine ROS  Renal/GU negative Renal ROS  negative genitourinary   Musculoskeletal negative musculoskeletal ROS (+)   Abdominal   Peds negative pediatric ROS (+)  Hematology negative hematology ROS (+)   Anesthesia Other Findings   Reproductive/Obstetrics negative OB ROS                             Anesthesia Physical Anesthesia Plan  ASA: II  Anesthesia Plan: Spinal   Post-op Pain Management:  Regional for Post-op pain   Induction:   Airway Management Planned: Simple Face Mask  Additional Equipment:   Intra-op Plan:   Post-operative Plan:   Informed Consent: I have reviewed the patients History and Physical, chart, labs and discussed the procedure including the risks, benefits and alternatives for the proposed anesthesia with the patient or authorized representative who has indicated his/her understanding and acceptance.   Dental advisory given  Plan Discussed with: CRNA  Anesthesia Plan Comments: (Adductor block)        Anesthesia Quick Evaluation

## 2016-07-26 NOTE — Progress Notes (Signed)
AssistedDr. Marcell Barlow with left ultrasound guided adductor block. Side rails up, monitors on throughout procedure. See vital signs in flow sheet. Tolerated Procedure well.

## 2016-07-26 NOTE — Op Note (Signed)
OPERATIVE REPORT-UNICOMPARTMENTAL ARTHROPLASTY  PREOPERATIVE DIAGNOSIS: Medial compartment osteoarthritis, Left knee  POSTOPERATIVE DIAGNOSIS: Medial compartment osteoarthritis, Left knee  PROCEDURE:Left knee medial unicompartmental arthroplasty.   SURGEON: Gaynelle Arabian, MD   ASSISTANT: Arlee Muslim, PA-C  ANESTHESIA:  Adductor canal block and spinal.   ESTIMATED BLOOD LOSS: Minimal.   DRAINS: Hemovac x1.   TOURNIQUET TIME:   Total Tourniquet Time Documented: Thigh (Left) - 25 minutes Total: Thigh (Left) - 25 minutes    COMPLICATIONS: None.   CONDITION: Stable to recovery.   BRIEF CLINICAL NOTE:Sherry Barnes is a 71 y.o. female, who has  significant isolated medial compartment arthritis of the Left knee. The patient has had nonoperative management including injections of cortisone and viscous supplements. Unfortunately, the pain persists.  Radiograph showed isolated medial compartment bone-on-bone arthritis  with normal-appearing patellofemoral and lateral compartments. The patient presents now for left knee unicompartmental arthroplasty.   PROCEDURE IN DETAIL: After successful administration of  Adductor canal block and spinal anesthetic, a tourniquet was placed high on the  Left thigh and the Left lower extremity prepped and draped in usual sterile fashion. Extremity was wrapped in an Esmarch, knee flexed, and tourniquet inflated to 300 mmHg.       A midline incision was made with a 10 blade through subcutaneous  tissue to the extensor mechanism. A fresh blade was used to make a  medial parapatellar arthrotomy. Soft tissue on the proximal medial  tibia subperiosteally elevated to the joint line with a knife and into  the semimembranosus bursa with a Cobb elevator. The patella was  subluxed laterally, and the knee flexed 90 degrees. The ACL was intact.  The marginal osteophytes on the medial femur and tibia were removed with  a rongeur. The medial meniscus  was also removed. The femoral cutting  block for the conformis unicompartmental knee system was placed along  the femur. There was excellent fit. I traced the outline. We then  removed any remaining cartilage within this outline. We then placed the  cutting block again and pinned in position. The posterior femoral cut  was made, it was approximately 5 mm. The lug holes for the femoral  component were then drilled through the cutting block. The cutting  block was subsequently removed. We then utilized the high speed burr to  create a small trough at the superior aspect of the component tomake it inset and would not overhang the cartilage. The trial was placed,  it had excellent fit. The trial was subsequently removed.       The trial was placed again and the B chip was placed. There was  excellent balance throughout full motion. Also with excellent fit on  the tibia. This was removed as was the femoral trial. A curette was  used to remove any remaining cartilage from the tibia. The tibial  cutting block was then placed and there was a perfect fit on the tibial  surface. The appropriate slope was placed and it was pinned in  position. The reciprocating saw was used to make the central cut and  then the oscillating saw used to make the horizontal cut. The bone  fragment was then removed. The tibial trial was placed and had perfect  fit on the tibia. We then drilled the 2 lug holes and did the keel punch.  We then placed tibia trial and femur trial, and a 6 mm trial insert. There was  excellent stability throughout full range of motion and no impingement.  The trial  was then removed. We drilled small holes in the distal  femur in order to create more conduits for the cement. The cut bone  surfaces were thoroughly irrigated with pulsatile lavage while the  cement was mixed on the back table. We then cemented the tibial  component into place, impacted it and removed the extruded cement. The   same was done for the femoral component. Trial 6-mm inserts placed,  knee held in full extension, and all extruded cement removed. While the  cement was hardening, I injected the extensor mechanism, periosteum of  the femur and subcu tissues with a total of 20 mL of Exparel mixed with 30  mL of saline. When the cement had fully hardened,  then the permanent polyethylene was placed in tibial tray. There was  excellent stability throughout full range of motion with no lift off the  component and no evidence of any impingement.       Wound was copiously irrigated with saline solution, and the arthrotomy closed over a Hemovac drain with a running #1 V-Loc suture. The subcutaneous was closed with  interrupted 2-0 Vicryl and subcuticular running 4-0 Monocryl. The drain  was hooked to suction. Incision cleaned and dried and Steri-Strips and  a bulky sterile dressing applied. The tourniquet was released after a  total time of 25 minutes. This was done after closing the extensor  mechanism. The wound was closed and a bulky sterile dressing was  applied. The operative limb was placed into a knee immobilizer, and the patient awakened and transported to recovery room in stable condition.       Please note that a surgical assistant was a medical necessity for this  procedure in order to perform it in a safe and expeditious manner.  Assistance was necessary for retracting vital ligaments, neurovascular  structures, as well as for proper positioning of the limb to allow for  appropriate bone cuts and appropriate placement of the prosthesis.    Dione Plover Yogi Arther, MD

## 2016-07-26 NOTE — Anesthesia Procedure Notes (Signed)
Spinal  Patient location during procedure: OR Start time: 07/26/2016 10:34 AM End time: 07/26/2016 10:37 AM Staffing Anesthesiologist: Montez Hageman Resident/CRNA: Chrystine Oiler G Performed: resident/CRNA  Preanesthetic Checklist Completed: patient identified, site marked, surgical consent, pre-op evaluation, timeout performed, IV checked, risks and benefits discussed and monitors and equipment checked Spinal Block Patient position: sitting Prep: DuraPrep Patient monitoring: heart rate, continuous pulse ox and blood pressure Approach: midline Location: L2-3 Injection technique: single-shot Needle Needle type: Pencan  Needle gauge: 24 G Needle length: 10 cm Needle insertion depth: 6 cm Assessment Sensory level: T6 Additional Notes Kit dated checked, Stick x 1, + CSF, - paraesthesia ,- heme, patent tolerated well.

## 2016-07-26 NOTE — Interval H&P Note (Signed)
History and Physical Interval Note:  07/26/2016 11:29 AM  Sherry Barnes  has presented today for surgery, with the diagnosis of LEFT KNEE MEDIAL COMPARTMENT OA  The various methods of treatment have been discussed with the patient and family. After consideration of risks, benefits and other options for treatment, the patient has consented to  Procedure(s): LEFT UNICOMPARTMENTAL KNEE ARTHROPLASTY (Left) as a surgical intervention .  The patient's history has been reviewed, patient examined, no change in status, stable for surgery.  I have reviewed the patient's chart and labs.  Questions were answered to the patient's satisfaction.     [name]

## 2016-07-27 ENCOUNTER — Encounter (HOSPITAL_COMMUNITY): Payer: Self-pay | Admitting: Orthopedic Surgery

## 2016-07-27 DIAGNOSIS — M170 Bilateral primary osteoarthritis of knee: Secondary | ICD-10-CM | POA: Diagnosis not present

## 2016-07-27 DIAGNOSIS — M25762 Osteophyte, left knee: Secondary | ICD-10-CM | POA: Diagnosis not present

## 2016-07-27 DIAGNOSIS — M85861 Other specified disorders of bone density and structure, right lower leg: Secondary | ICD-10-CM | POA: Diagnosis not present

## 2016-07-27 DIAGNOSIS — K219 Gastro-esophageal reflux disease without esophagitis: Secondary | ICD-10-CM | POA: Diagnosis not present

## 2016-07-27 DIAGNOSIS — J8410 Pulmonary fibrosis, unspecified: Secondary | ICD-10-CM | POA: Diagnosis not present

## 2016-07-27 DIAGNOSIS — M8588 Other specified disorders of bone density and structure, other site: Secondary | ICD-10-CM | POA: Diagnosis not present

## 2016-07-27 LAB — BASIC METABOLIC PANEL
ANION GAP: 5 (ref 5–15)
BUN: 8 mg/dL (ref 6–20)
CALCIUM: 8.8 mg/dL — AB (ref 8.9–10.3)
CO2: 26 mmol/L (ref 22–32)
Chloride: 109 mmol/L (ref 101–111)
Creatinine, Ser: 0.55 mg/dL (ref 0.44–1.00)
Glucose, Bld: 119 mg/dL — ABNORMAL HIGH (ref 65–99)
Potassium: 3.9 mmol/L (ref 3.5–5.1)
SODIUM: 140 mmol/L (ref 135–145)

## 2016-07-27 LAB — CBC
HCT: 31.7 % — ABNORMAL LOW (ref 36.0–46.0)
Hemoglobin: 10.9 g/dL — ABNORMAL LOW (ref 12.0–15.0)
MCH: 29.9 pg (ref 26.0–34.0)
MCHC: 34.4 g/dL (ref 30.0–36.0)
MCV: 86.8 fL (ref 78.0–100.0)
PLATELETS: 242 10*3/uL (ref 150–400)
RBC: 3.65 MIL/uL — ABNORMAL LOW (ref 3.87–5.11)
RDW: 13.6 % (ref 11.5–15.5)
WBC: 13.2 10*3/uL — AB (ref 4.0–10.5)

## 2016-07-27 MED ORDER — RIVAROXABAN 10 MG PO TABS: 10.0000 mg | ORAL_TABLET | Freq: Every day | ORAL | 0 refills | Status: DC

## 2016-07-27 MED ORDER — METHOCARBAMOL 500 MG PO TABS: 500.0000 mg | ORAL_TABLET | Freq: Four times a day (QID) | ORAL | 0 refills | Status: DC | PRN

## 2016-07-27 MED ORDER — SODIUM CHLORIDE 0.9 % IV BOLUS (SEPSIS): 250.0000 mL | Freq: Once | INTRAVENOUS | Status: DC

## 2016-07-27 MED ORDER — TRAMADOL HCL 50 MG PO TABS: 50.0000 mg | ORAL_TABLET | Freq: Four times a day (QID) | ORAL | 0 refills | Status: DC | PRN

## 2016-07-27 MED ORDER — OXYCODONE HCL 5 MG PO TABS: 5.0000 mg | ORAL_TABLET | ORAL | 0 refills | Status: DC | PRN

## 2016-07-27 NOTE — Evaluation (Signed)
Physical Therapy Evaluation Patient Details Name: Sherry Barnes MRN: [id number] DOB: [birth year] Today's Date: 07/27/2016   History of Present Illness  Left knee medial unicompartmental arthroplasty.   Clinical Impression  Pt s/p L UKR and presents with decreased L LE strength/ROM and post op pain limiting functional mobility.  Pt should progress to dc home with family assist and plans follow up OP PT starting tomorrow.    Follow Up Recommendations Outpatient PT    Equipment Recommendations  None recommended by PT    Recommendations for Other Services OT consult     Precautions / Restrictions Precautions Precautions: Fall Restrictions Weight Bearing Restrictions: No Other Position/Activity Restrictions: WBAT      Mobility  Bed Mobility Overal bed mobility: Needs Assistance Bed Mobility: Supine to Sit     Supine to sit: Supervision     General bed mobility comments: OOB with OT  Transfers Overall transfer level: Needs assistance Equipment used: Rolling walker (2 wheeled) Transfers: Sit to/from Stand Sit to Stand: Min guard;Supervision         General transfer comment: cues for LE management and use of UEs to self assist  Ambulation/Gait Ambulation/Gait assistance: Min assist;Min guard Ambulation Distance (Feet): 90 Feet Assistive device: Rolling walker (2 wheeled) Gait Pattern/deviations: Step-to pattern;Decreased step length - right;Decreased step length - left;Shuffle;Trunk flexed Gait velocity: decr Gait velocity interpretation: Below normal speed for age/gender General Gait Details: cues for sequence, posture and position from RW.  [employer] by [employer] (Stroke Patients Only)       Balance                                             Pertinent Vitals/Pain Pain Assessment: 0-10 Pain Score: 4  Pain Location: left knee Pain Descriptors / Indicators:  Aching;Sore Pain Intervention(s): Limited activity within patient's tolerance;Monitored during session;Premedicated before session;Ice applied    Home Living Family/patient expects to be discharged to:: Private residence Living Arrangements: Spouse/significant other Available Help at Discharge: Family;Available 24 hours/day Type of Home: House Home Access: Stairs to enter Entrance Stairs-Rails: None Entrance Stairs-Number of Steps: 1 Home Layout: One level Home Equipment: Walker - 2 wheels;Cane - single point      Prior Function Level of Independence: Independent;Independent with assistive device(s)         Comments: used cane as needed     Hand Dominance   Dominant Hand: Right    Extremity/Trunk Assessment   Upper Extremity Assessment Upper Extremity Assessment: Overall WFL for tasks assessed    Lower Extremity Assessment Lower Extremity Assessment: LLE deficits/detail LLE Deficits / Details: 2+/5 quads with AAROM at knee - 8 - 40    Cervical / Trunk Assessment Cervical / Trunk Assessment: Normal  Communication   Communication: No difficulties  Cognition Arousal/Alertness: Awake/alert Behavior During Therapy: WFL for tasks assessed/performed Overall Cognitive Status: Within Functional Limits for tasks assessed                      General Comments      Exercises Total Joint Exercises Ankle Circles/Pumps: AROM;Both;15 reps;Supine Quad Sets: AROM;Both;10 reps;Supine Heel Slides: AAROM;Left;Supine;10 reps Straight Leg Raises: AAROM;Left;10 reps;Supine   Assessment/Plan    PT Assessment Patient needs continued PT services  PT Problem List  Decreased strength;Decreased range of motion;Decreased activity tolerance;Decreased mobility;Decreased knowledge of use of DME;Pain;Obesity          PT Treatment Interventions DME instruction;Gait training;Stair training;Functional mobility training;Therapeutic activities;Therapeutic exercise;Patient/family  education    PT Goals (Current goals can be found in the Care Plan section)  Acute Rehab PT Goals Patient Stated Goal: home today PT Goal Formulation: With patient Time For Goal Achievement: 07/29/16 Potential to Achieve Goals: Fair    Frequency 7X/week   Barriers to discharge        Co-evaluation               End of Session Equipment Utilized During Treatment: Gait belt;Left knee immobilizer Activity Tolerance: Patient tolerated treatment well;Patient limited by fatigue Patient left: in chair;with call bell/phone within reach;with chair alarm set;with family/visitor present Nurse Communication: Mobility status    Functional Assessment Tool Used: Clinical observation Functional Limitation: Mobility: Walking and moving around Mobility: Walking and Moving Around Current Status [phone]): At least 20 percent but less than 40 percent impaired, limited or restricted Mobility: Walking and Moving Around Goal Status [phone]): At least 1 percent but less than 20 percent impaired, limited or restricted    Time: [id number] PT Time Calculation (min) (ACUTE ONLY): 40 min   Charges:   PT Evaluation $PT Eval Low Complexity: 1 Procedure PT Treatments $Gait Training: 8-22 mins $Therapeutic Exercise: 8-22 mins   PT G Codes:   PT G-Codes **NOT FOR INPATIENT CLASS** Functional Assessment Tool Used: Clinical observation Functional Limitation: Mobility: Walking and moving around Mobility: Walking and Moving Around Current Status [id number]): At least 20 percent but less than 40 percent impaired, limited or restricted Mobility: Walking and Moving Around Goal Status [phone]): At least 1 percent but less than 20 percent impaired, limited or restricted    Sherry Barnes 07/27/2016, 12:48 PM

## 2016-07-27 NOTE — Progress Notes (Signed)
Discharge teaching given to patient and husband, multiple questions ask and answered. Bethann Punches RN

## 2016-07-27 NOTE — Progress Notes (Signed)
Subjective: 1 Day Post-Op Procedure(s) (LRB): LEFT MEDIAL UNICOMPARTMENTAL KNEE ARTHROPLASTY (Left) Patient reports pain as mild.   Patient seen in rounds by Dr. Wynelle Link. Patient is well, but has had some minor complaints of pain in the knee, requiring pain medications Patient is ready to go home later today following therapy goals.  Objective: Vital signs in last 24 hours: Temp:  [97.3 F (36.3 C)-98.7 F (37.1 C)] 98.1 F (36.7 C) (02/08 0600) Pulse Rate:  [64-95] 64 (02/08 0600) Resp:  [7-20] 20 (02/08 0600) BP: (107-167)/(57-97) 107/57 (02/08 0600) SpO2:  [97 %-100 %] 98 % (02/08 0600) Weight:  [90.7 kg (200 lb)] 90.7 kg (200 lb) (02/07 1530)  Intake/Output from previous day:  Intake/Output Summary (Last 24 hours) at 07/27/16 0758 Last data filed at 07/27/16 0618  Gross per 24 hour  Intake             4415 ml  Output             2805 ml  Net             1610 ml    Intake/Output this shift: No intake/output data recorded.  Labs:  Recent Labs  07/27/16 0412  HGB 10.9*    Recent Labs  07/27/16 0412  WBC 13.2*  RBC 3.65*  HCT 31.7*  PLT 242    Recent Labs  07/27/16 0412  NA 140  K 3.9  CL 109  CO2 26  BUN 8  CREATININE 0.55  GLUCOSE 119*  CALCIUM 8.8*   No results for input(s): LABPT, INR in the last 72 hours.  EXAM: General - Patient is Alert, Appropriate and Oriented Extremity - Neurovascular intact Sensation intact distally Intact pulses distally Dorsiflexion/Plantar flexion intact Dressing - clean, dry Motor Function - intact, moving foot and toes well on exam.  Hemovac pulled without difficulty.  Assessment/Plan: 1 Day Post-Op Procedure(s) (LRB): LEFT MEDIAL UNICOMPARTMENTAL KNEE ARTHROPLASTY (Left) Procedure(s) (LRB): LEFT MEDIAL UNICOMPARTMENTAL KNEE ARTHROPLASTY (Left) Past Medical History:  Diagnosis Date  . Arthralgia of left elbow   . Dyspnea    at times  . GERD (gastroesophageal reflux disease)    at times  .  Granulomatous lung disease ([place])    PULMOLOGIST--  DR Lamonte Sakai--  PFT'S NORMAL  . H/O hernia repair    ventral  . History of cervical fracture    C5  laminar fx non-displace 02-21-2013 no surgical intervention  . History of DVT of lower extremity    behind left knee--  2011  resolved  . History of vulvar dysplasia   . Osteoarthritis   . Osteopenia    right hip, right neck  . Osteoporosis    LEFT HIP AND NECK  . Osteoporosis    left neck and left hip  . PONV (postoperative nausea and vomiting)    weakness  . Right knee meniscal tear   . Wears glasses    Principal Problem:   OA (osteoarthritis) of knee  Estimated body mass index is 36.58 kg/m as calculated from the following:   Height as of this encounter: 5\' 2"  (1.575 m).   Weight as of this encounter: 90.7 kg (200 lb). Up with therapy Discharge home - NO HHPT, straight to outpatient therapy Diet - Regular diet Follow up - in 2 weeks Activity - WBAT Dressing - May remove the surgical dressing tomorrow at home and then apply a dry gauze dressing daily. May shower three days following surgery but do not submerge the incision under  water. Disposition - Home Condition Upon Discharge - Good D/C Meds - See DC Summary DVT Prophylaxis Xarelto 10 mg daily for ten days, then change to Aspirin 325 mg daily for two weeks, then reduce to Baby Aspirin 81 mg daily for three additional weeks.  Arlee Muslim, PA-C Orthopaedic Surgery 07/27/2016, 7:58 AM

## 2016-07-27 NOTE — Care Management Note (Signed)
Case Management Note  Patient Details  Name: Sherry Barnes MRN: [id number] Date of Birth: June 01, 1946  Subjective/Objective:                  LEFT MEDIAL UNICOMPARTMENTAL KNEE ARTHROPLASTY (Left) Action/Plan: Discharge planning Expected Discharge Date:  07/27/16               Expected Discharge Plan:  Home/Self Care  In-House Referral:     Discharge planning Services  CM Consult  Post Acute Care Choice:    Choice offered to:  Patient  DME Arranged:  3-N-1 DME Agency:  [place]:  NA [place] Agency:  NA  Status of Service:  Completed, signed off  If discussed at [place] of Stay Meetings, dates discussed:    Additional Comments: CM met with pt in room to confirm plan is for outpt PT therapy (as HHPT orders placed); pt confirms as does PA note and RN understanding. CM notified [facility] DME rep Joelene Millin to please deliver the 3n1 to room prior to discharge. NO other CM needs were communicated. Dellie Catholic, RN 07/27/2016, 11:46 AM

## 2016-07-27 NOTE — Discharge Summary (Signed)
Physician Discharge Summary   Patient ID: Sherry Barnes MRN: [id number] DOB/AGE: [birth year] 71 y.o.  Admit date: 07/26/2016 Discharge date: 07-27-2016  Primary Diagnosis:  Medial compartment osteoarthritis, Left knee Admission Diagnoses:  Past Medical History:  Diagnosis Date  . Arthralgia of left elbow   . Dyspnea    at times  . GERD (gastroesophageal reflux disease)    at times  . Granulomatous lung disease ([place])    PULMOLOGIST--  DR Lamonte Sakai--  PFT'S NORMAL  . H/O hernia repair    ventral  . History of cervical fracture    C5  laminar fx non-displace 02-21-2013 no surgical intervention  . History of DVT of lower extremity    behind left knee--  2011  resolved  . History of vulvar dysplasia   . Osteoarthritis   . Osteopenia    right hip, right neck  . Osteoporosis    LEFT HIP AND NECK  . Osteoporosis    left neck and left hip  . PONV (postoperative nausea and vomiting)    weakness  . Right knee meniscal tear   . Wears glasses    Discharge Diagnoses:   Principal Problem:   OA (osteoarthritis) of knee  Estimated body mass index is 36.58 kg/m as calculated from the following:   Height as of this encounter: _0  (1.575 m).   Weight as of this encounter: 90.7 kg (200 lb).  Procedure:  Procedure(s) (LRB): LEFT MEDIAL UNICOMPARTMENTAL KNEE ARTHROPLASTY (Left)   Consults: None  HPI: BREAHNA Barnes is a 71 y.o. female, who has  significant isolated medial compartment arthritis of the Left knee. The patient has had nonoperative management including injections of cortisone and viscous supplements. Unfortunately, the pain persists.  Radiograph showed isolated medial compartment bone-on-bone arthritis  with normal-appearing patellofemoral and lateral compartments. The patient presents now for left knee unicompartmental arthroplasty.   Laboratory Data: Admission on 07/26/2016  Component Date Value Ref Range Status  . WBC 07/27/2016 13.2* 4.0 - 10.5 K/uL Final  . RBC  07/27/2016 3.65* 3.87 - 5.11 MIL/uL Final  . Hemoglobin 07/27/2016 10.9* 12.0 - 15.0 g/dL Final  . HCT 07/27/2016 31.7* 36.0 - 46.0 % Final  . MCV 07/27/2016 86.8  78.0 - 100.0 fL Final  . MCH 07/27/2016 29.9  26.0 - 34.0 pg Final  . MCHC 07/27/2016 34.4  30.0 - 36.0 g/dL Final  . RDW 07/27/2016 13.6  11.5 - 15.5 % Final  . Platelets 07/27/2016 242  150 - 400 K/uL Final  . Sodium 07/27/2016 140  135 - 145 mmol/L Final  . Potassium 07/27/2016 3.9  3.5 - 5.1 mmol/L Final  . Chloride 07/27/2016 109  101 - 111 mmol/L Final  . CO2 07/27/2016 26  22 - 32 mmol/L Final  . Glucose, Bld 07/27/2016 119* 65 - 99 mg/dL Final  . BUN 07/27/2016 8  6 - 20 mg/dL Final  . Creatinine, Ser 07/27/2016 0.55  0.44 - 1.00 mg/dL Final  . Calcium 07/27/2016 8.8* 8.9 - 10.3 mg/dL Final  . GFR calc non Af Amer 07/27/2016 >60  >60 mL/min Final  . GFR calc Af Amer 07/27/2016 >60  >60 mL/min Final   Comment: (NOTE) The eGFR has been calculated using the CKD EPI equation. This calculation has not been validated in all clinical situations. eGFR's persistently <60 mL/min signify possible Chronic Kidney Disease.   Georgiann Hahn gap 07/27/2016 5  5 - 15 Final  Hospital Outpatient Visit on 07/19/2016  Component Date Value Ref Range  Status  . aPTT 07/19/2016 28  24 - 36 seconds Final  . WBC 07/19/2016 7.2  4.0 - 10.5 K/uL Final  . RBC 07/19/2016 4.33  3.87 - 5.11 MIL/uL Final  . Hemoglobin 07/19/2016 12.6  12.0 - 15.0 g/dL Final  . HCT 07/19/2016 37.6  36.0 - 46.0 % Final  . MCV 07/19/2016 86.8  78.0 - 100.0 fL Final  . MCH 07/19/2016 29.1  26.0 - 34.0 pg Final  . MCHC 07/19/2016 33.5  30.0 - 36.0 g/dL Final  . RDW 07/19/2016 13.5  11.5 - 15.5 % Final  . Platelets 07/19/2016 285  150 - 400 K/uL Final  . Sodium 07/19/2016 139  135 - 145 mmol/L Final  . Potassium 07/19/2016 5.1  3.5 - 5.1 mmol/L Final  . Chloride 07/19/2016 105  101 - 111 mmol/L Final  . CO2 07/19/2016 28  22 - 32 mmol/L Final  . Glucose, Bld 07/19/2016  103* 65 - 99 mg/dL Final  . BUN 07/19/2016 11  6 - 20 mg/dL Final  . Creatinine, Ser 07/19/2016 0.67  0.44 - 1.00 mg/dL Final  . Calcium 07/19/2016 9.6  8.9 - 10.3 mg/dL Final  . Total Protein 07/19/2016 7.0  6.5 - 8.1 g/dL Final  . Albumin 07/19/2016 4.0  3.5 - 5.0 g/dL Final  . AST 07/19/2016 22  15 - 41 U/L Final  . ALT 07/19/2016 20  14 - 54 U/L Final  . Alkaline Phosphatase 07/19/2016 62  38 - 126 U/L Final  . Total Bilirubin 07/19/2016 0.7  0.3 - 1.2 mg/dL Final  . GFR calc non Af Amer 07/19/2016 >60  >60 mL/min Final  . GFR calc Af Amer 07/19/2016 >60  >60 mL/min Final   Comment: (NOTE) The eGFR has been calculated using the CKD EPI equation. This calculation has not been validated in all clinical situations. eGFR's persistently <60 mL/min signify possible Chronic Kidney Disease.   . Anion gap 07/19/2016 6  5 - 15 Final  . Prothrombin Time 07/19/2016 12.5  11.4 - 15.2 seconds Final  . INR 07/19/2016 0.93   Final  . ABO/RH(D) 07/19/2016 O POS   Final  . Antibody Screen 07/19/2016 NEG   Final  . Sample Expiration 07/19/2016 07/29/2016   Final  . Extend sample reason 07/19/2016 NO TRANSFUSIONS OR PREGNANCY IN THE PAST 3 MONTHS   Final  . MRSA, PCR 07/19/2016 NEGATIVE  NEGATIVE Final  . Staphylococcus aureus 07/19/2016 NEGATIVE  NEGATIVE Final   Comment:        The Xpert SA Assay (FDA approved for NASAL specimens in patients over 56 years of age), is one component of a comprehensive surveillance program.  Test performance has been validated by [facility] for patients greater than or equal to 36 year old. It is not intended to diagnose infection nor to guide or monitor treatment.   . ABO/RH(D) 07/19/2016 O POS   Final     X-Rays:No results found.  EKG: Orders placed or performed in visit on 04/11/16  . EKG 12-Lead     Hospital Course: Sherry Barnes is a 71 y.o. who was admitted to [facility]. They were brought to the operating room on 07/26/2016 and  underwent Procedure(s): LEFT MEDIAL UNICOMPARTMENTAL KNEE ARTHROPLASTY.  Patient tolerated the procedure well and was later transferred to the recovery room and then to the orthopaedic floor for postoperative care.  They were given PO and IV analgesics for pain control following their surgery.  They were given 24  hours of postoperative antibiotics of  Anti-infectives    Start     Dose/Rate Route Frequency Ordered Stop   07/26/16 1900  ceFAZolin (ANCEF) IVPB 2g/100 mL premix     2 g 200 mL/hr over 30 Minutes Intravenous Every 6 hours 07/26/16 1531 07/27/16 0219   07/26/16 0952  ceFAZolin (ANCEF) IVPB 2g/100 mL premix     2 g 200 mL/hr over 30 Minutes Intravenous On call to O.R. 07/26/16 2633 07/26/16 1238     and started on DVT prophylaxis in the form of Xarelto.   PT and OT were ordered for postop therapy protocol.  Discharge planning consulted to help with postop disposition and equipment needs.  Patient had a good night on the evening of surgery.  They started to get up OOB with therapy on day one. Hemovac drain was pulled without difficulty.  Patient was seen in rounds on day one and it was felt that as long as they did well with the remaining sessions of therapy that they would be ready to go home.  Arrangements were made and they were setup to go home on POD 1.  Discharge home - NO HHPT, straight to outpatient therapy Diet - Regular diet Follow up - in 2 weeks Activity - WBAT Dressing - May remove the surgical dressing tomorrow at home and then apply a dry gauze dressing daily. May shower three days following surgery but do not submerge the incision under water. Disposition - Home Condition Upon Discharge - Good D/C Meds - See DC Summary DVT Prophylaxis Xarelto 10 mg daily for ten days, then change to Aspirin 325 mg daily for two weeks, then reduce to Baby Aspirin 81 mg daily for three additional weeks.  Discharge Instructions    Call MD / Call 911    Complete by:  As directed    If  you experience chest pain or shortness of breath, CALL 911 and be transported to the hospital emergency room.  If you develope a fever above 101 F, pus (white drainage) or increased drainage or redness at the wound, or calf pain, call your surgeon's office.   Change dressing    Complete by:  As directed    Change dressing daily with sterile 4 x 4 inch gauze dressing and apply TED hose. Do not submerge the incision under water.   Constipation Prevention    Complete by:  As directed    Drink plenty of fluids.  Prune juice may be helpful.  You may use a stool softener, such as Colace (over the counter) 100 mg twice a day.  Use MiraLax (over the counter) for constipation as needed.   Diet general    Complete by:  As directed    Discharge instructions    Complete by:  As directed    Pick up stool softner and laxative for home use following surgery while on pain medications. Do not submerge incision under water. Please use good hand washing techniques while changing dressing each day. May shower starting three days after surgery. Please use a clean towel to pat the incision dry following showers. Continue to use ice for pain and swelling after surgery. Do not use any lotions or creams on the incision until instructed by your surgeon.  Wear both TED hose on both legs during the day every day for three weeks, but may have off at night at home.  Postoperative Constipation Protocol  Constipation - defined medically as fewer than three stools per week and severe  constipation as less than one stool per week.  One of the most common issues patients have following surgery is constipation.  Even if you have a regular bowel pattern at home, your normal regimen is likely to be disrupted due to multiple reasons following surgery.  Combination of anesthesia, postoperative narcotics, change in appetite and fluid intake all can affect your bowels.  In order to avoid complications following surgery, here are some  recommendations in order to help you during your recovery period.  Colace (docusate) - Pick up an over-the-counter form of Colace or another stool softener and take twice a day as long as you are requiring postoperative pain medications.  Take with a full glass of water daily.  If you experience loose stools or diarrhea, hold the colace until you stool forms back up.  If your symptoms do not get better within 1 week or if they get worse, check with your doctor.  Dulcolax (bisacodyl) - Pick up over-the-counter and take as directed by the product packaging as needed to assist with the movement of your bowels.  Take with a full glass of water.  Use this product as needed if not relieved by Colace only.   MiraLax (polyethylene glycol) - Pick up over-the-counter to have on hand.  MiraLax is a solution that will increase the amount of water in your bowels to assist with bowel movements.  Take as directed and can mix with a glass of water, juice, soda, coffee, or tea.  Take if you go more than two days without a movement. Do not use MiraLax more than once per day. Call your doctor if you are still constipated or irregular after using this medication for 7 days in a row.  If you continue to have problems with postoperative constipation, please contact the office for further assistance and recommendations.  If you experience "the worst abdominal pain ever" or develop nausea or vomiting, please contact the office immediatly for further recommendations for treatment.   Xarelto 10 mg daily for ten days, then change to Aspirin 325 mg daily for two weeks, then reduce to Baby Aspirin 81 mg daily for three additional weeks.   Do not put a pillow under the knee. Place it under the heel.    Complete by:  As directed    Do not sit on low chairs, stoools or toilet seats, as it may be difficult to get up from low surfaces    Complete by:  As directed    Driving restrictions    Complete by:  As directed    No driving  until released by the physician.   Increase activity slowly as tolerated    Complete by:  As directed    Lifting restrictions    Complete by:  As directed    No lifting until released by the physician.   Patient may shower    Complete by:  As directed    You may shower without a dressing once there is no drainage.  Do not wash over the wound.  If drainage remains, do not shower until drainage stops.   TED hose    Complete by:  As directed    Use stockings (TED hose) for 3 weeks on both leg(s).  You may remove them at night for sleeping.   Weight bearing as tolerated    Complete by:  As directed    Laterality:  left   Extremity:  Lower     Allergies as of 07/27/2016  No Known Allergies     Medication List    STOP taking these medications   ALEVE 220 MG Caps Generic drug:  Naproxen Sodium   CALTRATE 600+D PO   PROBIOTIC DAILY PO   pyridOXINE 100 MG tablet Commonly known as:  VITAMIN B-6   vitamin B-12 1000 MCG tablet Commonly known as:  CYANOCOBALAMIN   vitamin C 500 MG tablet Commonly known as:  ASCORBIC ACID   Vitamin D3 1000 units Caps     TAKE these medications   acetaminophen 500 MG tablet Commonly known as:  TYLENOL Take 1,000 mg by mouth every 6 (six) hours as needed for moderate pain or headache.   methocarbamol 500 MG tablet Commonly known as:  ROBAXIN Take 1 tablet (500 mg total) by mouth every 6 (six) hours as needed for muscle spasms.   oxyCODONE 5 MG immediate release tablet Commonly known as:  Oxy IR/ROXICODONE Take 1-2 tablets (5-10 mg total) by mouth every 4 (four) hours as needed for moderate pain or severe pain.   rivaroxaban 10 MG Tabs tablet Commonly known as:  XARELTO Take 1 tablet (10 mg total) by mouth daily with breakfast. Xarelto 10 mg daily for ten days, then change to Aspirin 325 mg daily for two weeks, then reduce to Baby Aspirin 81 mg daily for three additional weeks.   traMADol 50 MG tablet Commonly known as:  ULTRAM Take 1-2  tablets (50-100 mg total) by mouth every 6 (six) hours as needed (mild to moderate pain).      Follow-up Information    [name], MD. Schedule an appointment as soon as possible for a visit on 08/08/2016.   Specialty:  Orthopedic Surgery Contact information: [address] [place] [address] [phone]           Signed: Arlee Muslim, PA-C Orthopaedic Surgery 07/27/2016, 8:04 AM

## 2016-07-27 NOTE — Discharge Instructions (Addendum)
° °Dr. Frank Aluisio °Total Joint Specialist °[facility] °[address] °[place], [place] [address] °[phone] ° °UNI KNEE REPLACEMENT POSTOPERATIVE DIRECTIONS ° ° °Knee Rehabilitation, Guidelines Following Surgery  °Results after knee surgery are often greatly improved when you follow the exercise, range of motion and muscle strengthening exercises prescribed by your doctor. Safety measures are also important to protect the knee from further injury. Any time any of these exercises cause you to have increased pain or swelling in your knee joint, decrease the amount until you are comfortable again and slowly increase them. If you have problems or questions, call your caregiver or physical therapist for advice.  ° °HOME CARE INSTRUCTIONS  °Remove items at home which could result in a fall. This includes throw rugs or furniture in walking pathways.  °· ICE to the affected knee every three hours for 30 minutes at a time and then as needed for pain and swelling.  Continue to use ice on the knee for pain and swelling from surgery. You may notice swelling that will progress down to the foot and ankle.  This is normal after surgery.  Elevate the leg when you are not up walking on it.   °· Continue to use the breathing machine which will help keep your temperature down.  It is common for your temperature to cycle up and down following surgery, especially at night when you are not up moving around and exerting yourself.  The breathing machine keeps your lungs expanded and your temperature down. °· Do not place pillow under knee, focus on keeping the knee straight while resting ° °DIET °You may resume your previous home diet once your are discharged from the hospital. ° °DRESSING / WOUND CARE / SHOWERING °You may shower 3 days after surgery, but keep the wounds dry during showering.  You may use an occlusive plastic wrap (Press'n Seal for example), NO SOAKING/SUBMERGING IN THE BATHTUB.  If the  bandage gets wet, change with a clean dry gauze.  If the incision gets wet, pat the wound dry with a clean towel. °You may start showering once you are discharged home but do not submerge the incision under water. Just pat the incision dry and apply a dry gauze dressing on daily. °Change the surgical dressing daily and reapply a dry dressing each time. ° °ACTIVITY °Walk with your walker as instructed. °Use walker as long as suggested by your caregivers. °Avoid periods of inactivity such as sitting longer than an hour when not asleep. This helps prevent blood clots.  °You may resume a sexual relationship in one month or when given the OK by your doctor.  °You may return to work once you are cleared by your doctor.  °Do not drive a car for 6 weeks or until released by you surgeon.  °Do not drive while taking narcotics. ° °WEIGHT BEARING °Weight bearing as tolerated with assist device (walker, cane, etc) as directed, use it as long as suggested by your surgeon or therapist, typically at least 4-6 weeks. ° °POSTOPERATIVE CONSTIPATION PROTOCOL °Constipation - defined medically as fewer than three stools per week and severe constipation as less than one stool per week. ° °One of the most common issues patients have following surgery is constipation.  Even if you have a regular bowel pattern at home, your normal regimen is likely to be disrupted due to multiple reasons following surgery.  Combination of anesthesia, postoperative narcotics, change in appetite and fluid intake all can affect your bowels.    In order to avoid complications following surgery, here are some recommendations in order to help you during your recovery period.  Colace (docusate) - Pick up an over-the-counter form of Colace or another stool softener and take twice a day as long as you are requiring postoperative pain medications.  Take with a full glass of water daily.  If you experience loose stools or diarrhea, hold the colace until you stool forms  back up.  If your symptoms do not get better within 1 week or if they get worse, check with your doctor.  Dulcolax (bisacodyl) - Pick up over-the-counter and take as directed by the product packaging as needed to assist with the movement of your bowels.  Take with a full glass of water.  Use this product as needed if not relieved by Colace only.   MiraLax (polyethylene glycol) - Pick up over-the-counter to have on hand.  MiraLax is a solution that will increase the amount of water in your bowels to assist with bowel movements.  Take as directed and can mix with a glass of water, juice, soda, coffee, or tea.  Take if you go more than two days without a movement. Do not use MiraLax more than once per day. Call your doctor if you are still constipated or irregular after using this medication for 7 days in a row.  If you continue to have problems with postoperative constipation, please contact the office for further assistance and recommendations.  If you experience "the worst abdominal pain ever" or develop nausea or vomiting, please contact the office immediatly for further recommendations for treatment.  ITCHING  If you experience itching with your medications, try taking only a single pain pill, or even half a pain pill at a time.  You can also use Benadryl over the counter for itching or also to help with sleep.   TED HOSE STOCKINGS Wear the elastic stockings on both legs for three weeks following surgery during the day but you may remove then at night for sleeping.  MEDICATIONS See your medication summary on the After Visit Summary that the nursing staff will review with you prior to discharge.  You may have some home medications which will be placed on hold until you complete the course of blood thinner medication.  It is important for you to complete the blood thinner medication as prescribed by your surgeon.  Continue your approved medications as instructed at time of  discharge.  PRECAUTIONS If you experience chest pain or shortness of breath - call 911 immediately for transfer to the hospital emergency department.  If you develop a fever greater that 101 F, purulent drainage from wound, increased redness or drainage from wound, foul odor from the wound/dressing, or calf pain - CONTACT YOUR SURGEON.                                                   FOLLOW-UP APPOINTMENTS Make sure you keep all of your appointments after your operation with your surgeon and caregivers. You should call the office at the above phone number and make an appointment for approximately two weeks after the date of your surgery or on the date instructed by your surgeon outlined in the "After Visit Summary".  RANGE OF MOTION AND STRENGTHENING EXERCISES  Rehabilitation of the knee is important following a knee injury or an  operation. After just a few days of immobilization, the muscles of the thigh which control the knee become weakened and shrink (atrophy). Knee exercises are designed to build up the tone and strength of the thigh muscles and to improve knee motion. Often times heat used for twenty to thirty minutes before working out will loosen up your tissues and help with improving the range of motion but do not use heat for the first two weeks following surgery. These exercises can be done on a training (exercise) mat, on the floor, on a table or on a bed. Use what ever works the best and is most comfortable for you Knee exercises include:  Leg Lifts - While your knee is still immobilized in a splint or cast, you can do straight leg raises. Lift the leg to 60 degrees, hold for 3 sec, and slowly lower the leg. Repeat 10-20 times 2-3 times daily. Perform this exercise against resistance later as your knee gets better.  Quad and Hamstring Sets - Tighten up the muscle on the front of the thigh (Quad) and hold for 5-10 sec. Repeat this 10-20 times hourly. Hamstring sets are done by pushing the  foot backward against an object and holding for 5-10 sec. Repeat as with quad sets.   Leg Slides: Lying on your back, slowly slide your foot toward your buttocks, bending your knee up off the floor (only go as far as is comfortable). Then slowly slide your foot back down until your leg is flat on the floor again.  Angel Wings: Lying on your back spread your legs to the side as far apart as you can without causing discomfort.  A rehabilitation program following serious knee injuries can speed recovery and prevent re-injury in the future due to weakened muscles. Contact your doctor or a physical therapist for more information on knee rehabilitation.   IF YOU ARE TRANSFERRED TO A SKILLED REHAB FACILITY If the patient is transferred to a skilled rehab facility following release from the hospital, a list of the current medications will be sent to the facility for the patient to continue.  When discharged from the skilled rehab facility, please have the facility set up the patient's [place] prior to being released. Also, the skilled facility will be responsible for providing the patient with their medications at time of release from the facility to include their pain medication, the muscle relaxants, and their blood thinner medication. If the patient is still at the rehab facility at time of the two week follow up appointment, the skilled rehab facility will also need to assist the patient in arranging follow up appointment in our office and any transportation needs.  MAKE SURE YOU:  Understand these instructions.  Get help right away if you are not doing well or get worse.    Pick up stool softner and laxative for home use following surgery while on pain medications. Do not submerge incision under water. Please use good hand washing techniques while changing dressing each day. May shower starting three days after surgery. Please use a clean towel to pat the incision dry following  showers. Continue to use ice for pain and swelling after surgery. Do not use any lotions or creams on the incision until instructed by your surgeon.   Take Xarelto 10 mg daily for ten days, then change to Aspirin 325 mg daily for two weeks, then reduce to Baby Aspirin 81 mg daily for three additional weeks.  Information on my medicine -  XARELTO (Rivaroxaban)  This medication education was reviewed with me or my healthcare representative as part of my discharge preparation.  The pharmacist that spoke with me during my hospital stay was:  Leanna Battles, Student-PharmD  Why was Xarelto prescribed for you? Xarelto was prescribed for you to reduce the risk of blood clots forming after orthopedic surgery. The medical term for these abnormal blood clots is venous thromboembolism (VTE).  What do you need to know about xarelto ? Take your Xarelto ONCE DAILY at the same time every day. You may take it either with or without food.  If you have difficulty swallowing the tablet whole, you may crush it and mix in applesauce just prior to taking your dose.  Take Xarelto exactly as prescribed by your doctor and DO NOT stop taking Xarelto without talking to the doctor who prescribed the medication.  Stopping without other VTE prevention medication to take the place of Xarelto may increase your risk of developing a clot.  After discharge, you should have regular check-up appointments with your healthcare provider that is prescribing your Xarelto.    What do you do if you miss a dose? If you miss a dose, take it as soon as you remember on the same day then continue your regularly scheduled once daily regimen the next day. Do not take two doses of Xarelto on the same day.   Important Safety Information A possible side effect of Xarelto is bleeding. You should call your healthcare provider right away if you experience any of the following: ? Bleeding from an injury or your nose that does not  stop. ? Unusual colored urine (red or dark brown) or unusual colored stools (red or black). ? Unusual bruising for unknown reasons. ? A serious fall or if you hit your head (even if there is no bleeding).  Some medicines may interact with Xarelto and might increase your risk of bleeding while on Xarelto. To help avoid this, consult your healthcare provider or pharmacist prior to using any new prescription or non-prescription medications, including herbals, vitamins, non-steroidal anti-inflammatory drugs (NSAIDs) and supplements.  This website has more information on Xarelto: [email or web address]

## 2016-07-27 NOTE — Evaluation (Signed)
Occupational Therapy Evaluation and Discharge Patient Details Name: Sherry Barnes MRN: [id number] DOB: 04-Aug-1945 Today's Date: 07/27/2016    History of Present Illness Left knee medial unicompartmental arthroplasty.    Clinical Impression   This 71 yo female admitted and underwent above presents to acute OT with all education complete with pt. No further OT needs, we will sign off.    Follow Up Recommendations  No OT follow up;Supervision/Assistance - 24 hour    Equipment Recommendations  3 in 1 bedside commode       Precautions / Restrictions Precautions Precautions: Fall Restrictions Weight Bearing Restrictions: No      Mobility Bed Mobility Overal bed mobility: Needs Assistance Bed Mobility: Supine to Sit     Supine to sit: Supervision     General bed mobility comments: VCs for sequencing/technique  Transfers Overall transfer level: Needs assistance Equipment used: Rolling walker (2 wheeled) Transfers: Sit to/from Stand Sit to Stand: Supervision                   ADL Overall ADL's : Needs assistance/impaired Eating/Feeding: Independent;Sitting   Grooming: Min guard;Standing   Upper Body Bathing: Set up;Sitting   Lower Body Bathing: Minimal assistance (min guard A sit<>stand)   Upper Body Dressing : Set up;Sitting   Lower Body Dressing: Minimal assistance (min guard A sit<>stand)   Toilet Transfer: Min guard;Ambulation;RW;BSC (over toilet)   Toileting- Clothing Manipulation and Hygiene: Min guard;Sit to/from Nurse, children's Details (indicate cue type and reason): Pt plans waiting to shower until she feels she can safely step in/out of tub and be able to stand to shower                   Pertinent Vitals/Pain Pain Assessment: 0-10 Pain Score: 2  Pain Location: left knee Pain Descriptors / Indicators: Aching;Sore Pain Intervention(s): Limited activity within patient's tolerance;Monitored during  session;Repositioned;Ice applied     Hand Dominance Right   Extremity/Trunk Assessment Upper Extremity Assessment Upper Extremity Assessment: Overall WFL for tasks assessed   Lower Extremity Assessment Lower Extremity Assessment: Defer to PT evaluation       Communication Communication Communication: No difficulties   Cognition Arousal/Alertness: Awake/alert Behavior During Therapy: WFL for tasks assessed/performed Overall Cognitive Status: Within Functional Limits for tasks assessed                                Home Living Family/patient expects to be discharged to:: Private residence Living Arrangements: Spouse/significant other Available Help at Discharge: Family;Available 24 hours/day Type of Home: House Home Access: Stairs to enter [employer] of Steps: 1 Entrance Stairs-Rails: None Home Layout: One level     Bathroom Shower/Tub: Tub/shower unit;Door Shower/tub characteristics: Architectural technologist: Standard     Home Equipment: Environmental consultant - 2 wheels;Cane - single point          Prior Functioning/Environment Level of Independence: Independent                 OT Problem List: Decreased range of motion;Pain      OT Goals(Current goals can be found in the care plan section) Acute Rehab OT Goals Patient Stated Goal: home today  OT Frequency:                End of Session Equipment Utilized During Treatment: Gait belt;Rolling walker  Activity Tolerance: Patient tolerated treatment well Patient left: in chair;with  call bell/phone within reach;with chair alarm set   Time: [phone] OT Time Calculation (min): 36 min Charges:  OT General Charges $OT Visit: 1 Procedure OT Evaluation $OT Eval Moderate Complexity: 1 Procedure OT Treatments $Self Care/Home Management : 8-22 mins G-Codes: OT G-codes **NOT FOR INPATIENT CLASS** Functional Assessment Tool Used: clinical observation Functional Limitation: Self care Self Care  Current Status [id number]): At least 1 percent but less than 20 percent impaired, limited or restricted Self Care Goal Status [id number]): At least 1 percent but less than 20 percent impaired, limited or restricted Self Care Discharge Status [phone]): At least 1 percent but less than 20 percent impaired, limited or restricted  Almon Register [id number] 07/27/2016, 10:37 AM

## 2016-07-27 NOTE — Progress Notes (Signed)
Physical Therapy Treatment Patient Details Name: Sherry Barnes MRN: [id number] DOB: 1945/07/17 Today's Date: 07/27/2016    History of Present Illness Left knee medial unicompartmental arthroplasty.     PT Comments    Pt progressing well with mobility.  Reviewed stairs and don/doff KI with pt.  Follow Up Recommendations  Outpatient PT     Equipment Recommendations  None recommended by PT    Recommendations for Other Services OT consult     Precautions / Restrictions Precautions Precautions: Fall Restrictions Weight Bearing Restrictions: No Other Position/Activity Restrictions: WBAT    Mobility  Bed Mobility               General bed mobility comments: NT - pt declines to attempt, states she feels comfortable with ability  Transfers Overall transfer level: Needs assistance Equipment used: Rolling walker (2 wheeled) Transfers: Sit to/from Stand Sit to Stand: Supervision         General transfer comment: cues for LE management and use of UEs to self assist  Ambulation/Gait Ambulation/Gait assistance: Min guard;Supervision Ambulation Distance (Feet): 75 Feet Assistive device: Rolling walker (2 wheeled) Gait Pattern/deviations: Step-to pattern;Decreased step length - right;Decreased step length - left;Shuffle;Trunk flexed Gait velocity: decr Gait velocity interpretation: Below normal speed for age/gender General Gait Details: cues for sequence, posture and position from RW.  Distance ltd by fatigue`   Stairs Stairs: Yes   Stair Management: No rails;Backwards;Forwards;With walker Number of Stairs: 4 General stair comments: single step 4x - twice fwd and twice bkwd with cues for sequence and foot/RW placement.  Spouse assisting and written instruction provided  Wheelchair Mobility    Modified Rankin (Stroke Patients Only)       Balance                                    Cognition Arousal/Alertness: Awake/alert Behavior During  Therapy: WFL for tasks assessed/performed Overall Cognitive Status: Within Functional Limits for tasks assessed                      Exercises Total Joint Exercises Ankle Circles/Pumps: AROM;Both;15 reps;Supine Quad Sets: AROM;Supine;5 reps;Both Heel Slides: AAROM;Left;Supine;10 reps Straight Leg Raises: AAROM;Left;10 reps;Supine    General Comments        Pertinent Vitals/Pain Pain Assessment: 0-10 Pain Score: 4  Pain Location: left knee Pain Descriptors / Indicators: Aching;Sore Pain Intervention(s): Limited activity within patient's tolerance;Monitored during session;Premedicated before session;Ice applied    Home Living Family/patient expects to be discharged to:: Private residence Living Arrangements: Spouse/significant other Available Help at Discharge: Family;Available 24 hours/day Type of Home: House Home Access: Stairs to enter Entrance Stairs-Rails: None Home Layout: One level Home Equipment: Environmental consultant - 2 wheels;Cane - single point      Prior Function Level of Independence: Independent;Independent with assistive device(s)      Comments: used cane as needed   PT Goals (current goals can now be found in the care plan section) Acute Rehab PT Goals Patient Stated Goal: home today PT Goal Formulation: With patient Time For Goal Achievement: 07/29/16 Potential to Achieve Goals: Fair Progress towards PT goals: Progressing toward goals    Frequency    7X/week      PT Plan Current plan remains appropriate    Co-evaluation             End of Session Equipment Utilized During Treatment: Gait belt;Left knee immobilizer Activity Tolerance: Patient  tolerated treatment well;Patient limited by fatigue Patient left: in chair;with call bell/phone within reach;with chair alarm set;with family/visitor present     Time: 1340-1413 PT Time Calculation (min) (ACUTE ONLY): 33 min  Charges:  $Gait Training: 8-22 mins $Therapeutic Exercise: 8-22  mins $Therapeutic Activity: 8-22 mins                    G Codes:  Functional Assessment Tool Used: Clinical observation Functional Limitation: Mobility: Walking and moving around Mobility: Walking and Moving Around Current Status [id number]): At least 20 percent but less than 40 percent impaired, limited or restricted Mobility: Walking and Moving Around Goal Status [phone]): At least 1 percent but less than 20 percent impaired, limited or restricted   Tadarrius Burch 07/27/2016, 3:51 PM

## 2016-07-28 DIAGNOSIS — M25562 Pain in left knee: Secondary | ICD-10-CM | POA: Diagnosis not present

## 2016-07-31 DIAGNOSIS — M25562 Pain in left knee: Secondary | ICD-10-CM | POA: Diagnosis not present

## 2016-08-02 DIAGNOSIS — M25562 Pain in left knee: Secondary | ICD-10-CM | POA: Diagnosis not present

## 2016-08-04 DIAGNOSIS — M25562 Pain in left knee: Secondary | ICD-10-CM | POA: Diagnosis not present

## 2016-08-07 DIAGNOSIS — M25562 Pain in left knee: Secondary | ICD-10-CM | POA: Diagnosis not present

## 2016-08-09 DIAGNOSIS — M25562 Pain in left knee: Secondary | ICD-10-CM | POA: Diagnosis not present

## 2016-08-10 DIAGNOSIS — Z96652 Presence of left artificial knee joint: Secondary | ICD-10-CM | POA: Diagnosis not present

## 2016-08-10 DIAGNOSIS — Z471 Aftercare following joint replacement surgery: Secondary | ICD-10-CM | POA: Diagnosis not present

## 2016-08-11 DIAGNOSIS — M25562 Pain in left knee: Secondary | ICD-10-CM | POA: Diagnosis not present

## 2016-08-15 DIAGNOSIS — M25562 Pain in left knee: Secondary | ICD-10-CM | POA: Diagnosis not present

## 2016-08-17 DIAGNOSIS — M25562 Pain in left knee: Secondary | ICD-10-CM | POA: Diagnosis not present

## 2016-08-22 DIAGNOSIS — M25562 Pain in left knee: Secondary | ICD-10-CM | POA: Diagnosis not present

## 2016-08-24 DIAGNOSIS — M25562 Pain in left knee: Secondary | ICD-10-CM | POA: Diagnosis not present

## 2016-08-29 DIAGNOSIS — M25562 Pain in left knee: Secondary | ICD-10-CM | POA: Diagnosis not present

## 2016-08-31 DIAGNOSIS — M25562 Pain in left knee: Secondary | ICD-10-CM | POA: Diagnosis not present

## 2016-09-01 DIAGNOSIS — Z96652 Presence of left artificial knee joint: Secondary | ICD-10-CM | POA: Diagnosis not present

## 2016-09-01 DIAGNOSIS — Z471 Aftercare following joint replacement surgery: Secondary | ICD-10-CM | POA: Diagnosis not present

## 2016-09-26 ENCOUNTER — Encounter (HOSPITAL_COMMUNITY): Payer: Self-pay

## 2016-09-26 ENCOUNTER — Telehealth: Payer: Self-pay | Admitting: *Deleted

## 2016-09-26 ENCOUNTER — Ambulatory Visit (HOSPITAL_COMMUNITY)
Admission: RE | Admit: 2016-09-26 | Discharge: 2016-09-26 | Disposition: A | Discharge status: Home / Self Care | Payer: Medicare Other | Source: Ambulatory Visit | Attending: Cardiovascular Disease | Admitting: Cardiovascular Disease

## 2016-09-26 ENCOUNTER — Other Ambulatory Visit (HOSPITAL_COMMUNITY): Payer: Self-pay | Admitting: Orthopedic Surgery

## 2016-09-26 DIAGNOSIS — R52 Pain, unspecified: Secondary | ICD-10-CM

## 2016-09-26 DIAGNOSIS — I824Z1 Acute embolism and thrombosis of unspecified deep veins of right distal lower extremity: Secondary | ICD-10-CM | POA: Diagnosis not present

## 2016-09-26 DIAGNOSIS — M79661 Pain in right lower leg: Secondary | ICD-10-CM | POA: Insufficient documentation

## 2016-09-26 NOTE — Telephone Encounter (Signed)
Received patient as walk in with questions regarding [place] completed today-+ SVT in varicose vein in the right proximal calf.  Ordered by Dr. Maureen Ralphs.  Patient wondering what the course of treatment is and if she should be concerned.   Referred patient to ordering MD at [place] for treatment plan but advised I would route to Dr. Gwenlyn Found for any additional recommendations.  Reassured patient of results, patient agreed and verbalized understanding.

## 2016-09-26 NOTE — Progress Notes (Signed)
Today's right lower extremity venous duplex is positive for SVT in varicose veins in the right proximal calf. Preliminary results given to Faith.

## 2016-09-27 NOTE — Telephone Encounter (Signed)
Treatment should be prescribed by ordering physician. No specific treatment for a thrombosis of a varicose vein.

## 2016-10-03 NOTE — Telephone Encounter (Signed)
Patient made aware of recommendations, patient reports she is using warm compresses, elevation, and taking ASA as advised by orthopedic.  Reports improvement but hasn't resolved at this time.  Advised to continue current treatment and see if symptoms resolve.  Patient aware, verbalized understanding.

## 2016-10-10 DIAGNOSIS — Z96652 Presence of left artificial knee joint: Secondary | ICD-10-CM | POA: Diagnosis not present

## 2016-10-10 DIAGNOSIS — Z471 Aftercare following joint replacement surgery: Secondary | ICD-10-CM | POA: Diagnosis not present

## 2016-10-16 DIAGNOSIS — N958 Other specified menopausal and perimenopausal disorders: Secondary | ICD-10-CM | POA: Diagnosis not present

## 2016-10-16 DIAGNOSIS — N893 Dysplasia of vagina, unspecified: Secondary | ICD-10-CM | POA: Diagnosis not present

## 2016-10-16 DIAGNOSIS — M816 Localized osteoporosis [Lequesne]: Secondary | ICD-10-CM | POA: Diagnosis not present

## 2016-10-16 DIAGNOSIS — Z8544 Personal history of malignant neoplasm of other female genital organs: Secondary | ICD-10-CM | POA: Diagnosis not present

## 2016-10-16 DIAGNOSIS — Z6836 Body mass index (BMI) 36.0-36.9, adult: Secondary | ICD-10-CM | POA: Diagnosis not present

## 2016-10-16 DIAGNOSIS — Z01419 Encounter for gynecological examination (general) (routine) without abnormal findings: Secondary | ICD-10-CM | POA: Diagnosis not present

## 2016-10-24 DIAGNOSIS — H5201 Hypermetropia, right eye: Secondary | ICD-10-CM | POA: Diagnosis not present

## 2016-10-24 DIAGNOSIS — H52223 Regular astigmatism, bilateral: Secondary | ICD-10-CM | POA: Diagnosis not present

## 2016-10-24 DIAGNOSIS — H25813 Combined forms of age-related cataract, bilateral: Secondary | ICD-10-CM | POA: Diagnosis not present

## 2016-10-24 DIAGNOSIS — H1851 Endothelial corneal dystrophy: Secondary | ICD-10-CM | POA: Diagnosis not present

## 2016-10-24 DIAGNOSIS — H25819 Combined forms of age-related cataract, unspecified eye: Secondary | ICD-10-CM | POA: Diagnosis not present

## 2016-10-24 DIAGNOSIS — H25019 Cortical age-related cataract, unspecified eye: Secondary | ICD-10-CM | POA: Diagnosis not present

## 2016-10-26 ENCOUNTER — Telehealth: Payer: Self-pay | Admitting: Cardiovascular Disease

## 2016-10-26 NOTE — Telephone Encounter (Signed)
Spoke to the patient. She stated that she has been using warm compresses and has been taking aspirin for the clot in her right leg without any relief. She would like to know if Dr. Gwenlyn Found would recommend a Vein Specialist. Will route to Dr. Gwenlyn Found for further recommendation.

## 2016-10-26 NOTE — Telephone Encounter (Signed)
Pt would like for Berry to refer her to a Vein Specialist. She said the blood clot in her right leg is really bothering her.

## 2016-10-30 NOTE — Telephone Encounter (Signed)
Left detail message with results, ok per DPR, and to call back if any questions.  

## 2016-10-30 NOTE — Telephone Encounter (Signed)
Pt.notified

## 2016-10-30 NOTE — Telephone Encounter (Signed)
Should prob see Dr Shelia Media first

## 2016-11-06 DIAGNOSIS — Z1231 Encounter for screening mammogram for malignant neoplasm of breast: Secondary | ICD-10-CM | POA: Diagnosis not present

## 2016-11-20 DIAGNOSIS — I83893 Varicose veins of bilateral lower extremities with other complications: Secondary | ICD-10-CM | POA: Diagnosis not present

## 2016-11-20 DIAGNOSIS — I8001 Phlebitis and thrombophlebitis of superficial vessels of right lower extremity: Secondary | ICD-10-CM | POA: Diagnosis not present

## 2016-11-20 DIAGNOSIS — I8311 Varicose veins of right lower extremity with inflammation: Secondary | ICD-10-CM | POA: Diagnosis not present

## 2016-11-20 DIAGNOSIS — I8312 Varicose veins of left lower extremity with inflammation: Secondary | ICD-10-CM | POA: Diagnosis not present

## 2016-11-22 DIAGNOSIS — I83893 Varicose veins of bilateral lower extremities with other complications: Secondary | ICD-10-CM | POA: Diagnosis not present

## 2016-11-22 DIAGNOSIS — I8312 Varicose veins of left lower extremity with inflammation: Secondary | ICD-10-CM | POA: Diagnosis not present

## 2016-11-22 DIAGNOSIS — I8001 Phlebitis and thrombophlebitis of superficial vessels of right lower extremity: Secondary | ICD-10-CM | POA: Diagnosis not present

## 2016-11-22 DIAGNOSIS — I8311 Varicose veins of right lower extremity with inflammation: Secondary | ICD-10-CM | POA: Diagnosis not present

## 2016-12-15 DIAGNOSIS — I8311 Varicose veins of right lower extremity with inflammation: Secondary | ICD-10-CM | POA: Diagnosis not present

## 2016-12-15 DIAGNOSIS — I83811 Varicose veins of right lower extremities with pain: Secondary | ICD-10-CM | POA: Diagnosis not present

## 2016-12-15 DIAGNOSIS — I83891 Varicose veins of right lower extremities with other complications: Secondary | ICD-10-CM | POA: Diagnosis not present

## 2016-12-18 DIAGNOSIS — I8311 Varicose veins of right lower extremity with inflammation: Secondary | ICD-10-CM | POA: Diagnosis not present

## 2017-01-01 DIAGNOSIS — I8311 Varicose veins of right lower extremity with inflammation: Secondary | ICD-10-CM | POA: Diagnosis not present

## 2017-01-01 DIAGNOSIS — I83891 Varicose veins of right lower extremities with other complications: Secondary | ICD-10-CM | POA: Diagnosis not present

## 2017-01-15 DIAGNOSIS — I8311 Varicose veins of right lower extremity with inflammation: Secondary | ICD-10-CM | POA: Diagnosis not present

## 2017-01-15 DIAGNOSIS — I83891 Varicose veins of right lower extremities with other complications: Secondary | ICD-10-CM | POA: Diagnosis not present

## 2017-01-24 DIAGNOSIS — L281 Prurigo nodularis: Secondary | ICD-10-CM | POA: Diagnosis not present

## 2017-01-24 DIAGNOSIS — L728 Other follicular cysts of the skin and subcutaneous tissue: Secondary | ICD-10-CM | POA: Diagnosis not present

## 2017-01-30 DIAGNOSIS — I8311 Varicose veins of right lower extremity with inflammation: Secondary | ICD-10-CM | POA: Diagnosis not present

## 2017-01-30 DIAGNOSIS — I83811 Varicose veins of right lower extremities with pain: Secondary | ICD-10-CM | POA: Diagnosis not present

## 2017-02-14 DIAGNOSIS — I83892 Varicose veins of left lower extremities with other complications: Secondary | ICD-10-CM | POA: Diagnosis not present

## 2017-02-14 DIAGNOSIS — I8312 Varicose veins of left lower extremity with inflammation: Secondary | ICD-10-CM | POA: Diagnosis not present

## 2017-02-21 DIAGNOSIS — I83812 Varicose veins of left lower extremities with pain: Secondary | ICD-10-CM | POA: Diagnosis not present

## 2017-02-21 DIAGNOSIS — I83892 Varicose veins of left lower extremities with other complications: Secondary | ICD-10-CM | POA: Diagnosis not present

## 2017-02-21 DIAGNOSIS — I8312 Varicose veins of left lower extremity with inflammation: Secondary | ICD-10-CM | POA: Diagnosis not present

## 2017-02-22 DIAGNOSIS — I8312 Varicose veins of left lower extremity with inflammation: Secondary | ICD-10-CM | POA: Diagnosis not present

## 2017-02-22 DIAGNOSIS — I8311 Varicose veins of right lower extremity with inflammation: Secondary | ICD-10-CM | POA: Diagnosis not present

## 2017-02-28 DIAGNOSIS — I8312 Varicose veins of left lower extremity with inflammation: Secondary | ICD-10-CM | POA: Diagnosis not present

## 2017-02-28 DIAGNOSIS — I83892 Varicose veins of left lower extremities with other complications: Secondary | ICD-10-CM | POA: Diagnosis not present

## 2017-03-06 DIAGNOSIS — L409 Psoriasis, unspecified: Secondary | ICD-10-CM | POA: Diagnosis not present

## 2017-03-08 DIAGNOSIS — I83892 Varicose veins of left lower extremities with other complications: Secondary | ICD-10-CM | POA: Diagnosis not present

## 2017-03-08 DIAGNOSIS — I8312 Varicose veins of left lower extremity with inflammation: Secondary | ICD-10-CM | POA: Diagnosis not present

## 2017-03-19 DIAGNOSIS — I83892 Varicose veins of left lower extremities with other complications: Secondary | ICD-10-CM | POA: Diagnosis not present

## 2017-03-19 DIAGNOSIS — I8312 Varicose veins of left lower extremity with inflammation: Secondary | ICD-10-CM | POA: Diagnosis not present

## 2017-04-25 DIAGNOSIS — H04123 Dry eye syndrome of bilateral lacrimal glands: Secondary | ICD-10-CM | POA: Diagnosis not present

## 2017-04-26 DIAGNOSIS — I8312 Varicose veins of left lower extremity with inflammation: Secondary | ICD-10-CM | POA: Diagnosis not present

## 2017-05-14 DIAGNOSIS — Z86718 Personal history of other venous thrombosis and embolism: Secondary | ICD-10-CM | POA: Diagnosis not present

## 2017-05-14 DIAGNOSIS — Z0000 Encounter for general adult medical examination without abnormal findings: Secondary | ICD-10-CM | POA: Diagnosis not present

## 2017-05-14 DIAGNOSIS — B399 Histoplasmosis, unspecified: Secondary | ICD-10-CM | POA: Diagnosis not present

## 2017-05-14 DIAGNOSIS — R0609 Other forms of dyspnea: Secondary | ICD-10-CM | POA: Diagnosis not present

## 2017-05-14 DIAGNOSIS — E559 Vitamin D deficiency, unspecified: Secondary | ICD-10-CM | POA: Diagnosis not present

## 2017-05-14 DIAGNOSIS — M8580 Other specified disorders of bone density and structure, unspecified site: Secondary | ICD-10-CM | POA: Diagnosis not present

## 2017-05-17 DIAGNOSIS — B399 Histoplasmosis, unspecified: Secondary | ICD-10-CM | POA: Diagnosis not present

## 2017-05-17 DIAGNOSIS — Z87891 Personal history of nicotine dependence: Secondary | ICD-10-CM | POA: Diagnosis not present

## 2017-05-17 DIAGNOSIS — Z23 Encounter for immunization: Secondary | ICD-10-CM | POA: Diagnosis not present

## 2017-05-17 DIAGNOSIS — Z86718 Personal history of other venous thrombosis and embolism: Secondary | ICD-10-CM | POA: Diagnosis not present

## 2017-05-17 DIAGNOSIS — M159 Polyosteoarthritis, unspecified: Secondary | ICD-10-CM | POA: Diagnosis not present

## 2017-05-17 DIAGNOSIS — Z6836 Body mass index (BMI) 36.0-36.9, adult: Secondary | ICD-10-CM | POA: Diagnosis not present

## 2017-05-17 DIAGNOSIS — M546 Pain in thoracic spine: Secondary | ICD-10-CM | POA: Diagnosis not present

## 2017-05-17 DIAGNOSIS — R0609 Other forms of dyspnea: Secondary | ICD-10-CM | POA: Diagnosis not present

## 2017-05-17 DIAGNOSIS — I8390 Asymptomatic varicose veins of unspecified lower extremity: Secondary | ICD-10-CM | POA: Diagnosis not present

## 2017-05-17 DIAGNOSIS — K589 Irritable bowel syndrome without diarrhea: Secondary | ICD-10-CM | POA: Diagnosis not present

## 2017-05-17 DIAGNOSIS — M810 Age-related osteoporosis without current pathological fracture: Secondary | ICD-10-CM | POA: Diagnosis not present

## 2017-05-17 DIAGNOSIS — J8410 Pulmonary fibrosis, unspecified: Secondary | ICD-10-CM | POA: Diagnosis not present

## 2017-05-17 DIAGNOSIS — M549 Dorsalgia, unspecified: Secondary | ICD-10-CM | POA: Diagnosis not present

## 2017-05-22 DIAGNOSIS — I8311 Varicose veins of right lower extremity with inflammation: Secondary | ICD-10-CM | POA: Diagnosis not present

## 2017-05-22 DIAGNOSIS — I8001 Phlebitis and thrombophlebitis of superficial vessels of right lower extremity: Secondary | ICD-10-CM | POA: Diagnosis not present

## 2017-06-26 DIAGNOSIS — H35372 Puckering of macula, left eye: Secondary | ICD-10-CM | POA: Diagnosis not present

## 2017-06-26 DIAGNOSIS — H02889 Meibomian gland dysfunction of unspecified eye, unspecified eyelid: Secondary | ICD-10-CM | POA: Diagnosis not present

## 2017-06-26 DIAGNOSIS — H2512 Age-related nuclear cataract, left eye: Secondary | ICD-10-CM | POA: Diagnosis not present

## 2017-06-26 DIAGNOSIS — H25811 Combined forms of age-related cataract, right eye: Secondary | ICD-10-CM | POA: Diagnosis not present

## 2017-07-05 DIAGNOSIS — M1712 Unilateral primary osteoarthritis, left knee: Secondary | ICD-10-CM | POA: Diagnosis not present

## 2017-07-05 DIAGNOSIS — Z471 Aftercare following joint replacement surgery: Secondary | ICD-10-CM | POA: Diagnosis not present

## 2017-07-05 DIAGNOSIS — M1711 Unilateral primary osteoarthritis, right knee: Secondary | ICD-10-CM | POA: Diagnosis not present

## 2017-07-05 DIAGNOSIS — Z96652 Presence of left artificial knee joint: Secondary | ICD-10-CM | POA: Diagnosis not present

## 2017-07-10 DIAGNOSIS — H52222 Regular astigmatism, left eye: Secondary | ICD-10-CM | POA: Diagnosis not present

## 2017-07-10 DIAGNOSIS — H5212 Myopia, left eye: Secondary | ICD-10-CM | POA: Diagnosis not present

## 2017-07-10 DIAGNOSIS — H1089 Other conjunctivitis: Secondary | ICD-10-CM | POA: Diagnosis not present

## 2017-07-10 DIAGNOSIS — Z9842 Cataract extraction status, left eye: Secondary | ICD-10-CM | POA: Diagnosis not present

## 2017-07-10 DIAGNOSIS — H2512 Age-related nuclear cataract, left eye: Secondary | ICD-10-CM | POA: Diagnosis not present

## 2017-07-10 DIAGNOSIS — H25812 Combined forms of age-related cataract, left eye: Secondary | ICD-10-CM | POA: Diagnosis not present

## 2017-07-10 DIAGNOSIS — Z961 Presence of intraocular lens: Secondary | ICD-10-CM | POA: Diagnosis not present

## 2017-07-25 DIAGNOSIS — Z01818 Encounter for other preprocedural examination: Secondary | ICD-10-CM | POA: Diagnosis not present

## 2017-07-25 DIAGNOSIS — H25811 Combined forms of age-related cataract, right eye: Secondary | ICD-10-CM | POA: Diagnosis not present

## 2017-07-26 DIAGNOSIS — H2511 Age-related nuclear cataract, right eye: Secondary | ICD-10-CM | POA: Diagnosis not present

## 2017-07-26 DIAGNOSIS — H25811 Combined forms of age-related cataract, right eye: Secondary | ICD-10-CM | POA: Diagnosis not present

## 2017-07-26 DIAGNOSIS — H2512 Age-related nuclear cataract, left eye: Secondary | ICD-10-CM | POA: Diagnosis not present

## 2017-07-26 DIAGNOSIS — H5213 Myopia, bilateral: Secondary | ICD-10-CM | POA: Diagnosis not present

## 2017-07-26 DIAGNOSIS — Z961 Presence of intraocular lens: Secondary | ICD-10-CM | POA: Diagnosis not present

## 2017-07-26 DIAGNOSIS — H52223 Regular astigmatism, bilateral: Secondary | ICD-10-CM | POA: Diagnosis not present

## 2017-07-26 DIAGNOSIS — Z9849 Cataract extraction status, unspecified eye: Secondary | ICD-10-CM | POA: Diagnosis not present

## 2017-09-13 DIAGNOSIS — H16141 Punctate keratitis, right eye: Secondary | ICD-10-CM | POA: Diagnosis not present

## 2017-10-23 DIAGNOSIS — N952 Postmenopausal atrophic vaginitis: Secondary | ICD-10-CM | POA: Diagnosis not present

## 2017-11-27 DIAGNOSIS — Z6837 Body mass index (BMI) 37.0-37.9, adult: Secondary | ICD-10-CM | POA: Diagnosis not present

## 2017-11-27 DIAGNOSIS — Z124 Encounter for screening for malignant neoplasm of cervix: Secondary | ICD-10-CM | POA: Diagnosis not present

## 2017-11-27 DIAGNOSIS — Z1231 Encounter for screening mammogram for malignant neoplasm of breast: Secondary | ICD-10-CM | POA: Diagnosis not present

## 2018-03-01 DIAGNOSIS — H0012 Chalazion right lower eyelid: Secondary | ICD-10-CM | POA: Diagnosis not present

## 2018-04-15 DIAGNOSIS — Z1211 Encounter for screening for malignant neoplasm of colon: Secondary | ICD-10-CM | POA: Diagnosis not present

## 2018-04-15 DIAGNOSIS — R1013 Epigastric pain: Secondary | ICD-10-CM | POA: Diagnosis not present

## 2018-04-24 DIAGNOSIS — H35361 Drusen (degenerative) of macula, right eye: Secondary | ICD-10-CM | POA: Diagnosis not present

## 2018-04-24 DIAGNOSIS — H5213 Myopia, bilateral: Secondary | ICD-10-CM | POA: Diagnosis not present

## 2018-04-24 DIAGNOSIS — H04123 Dry eye syndrome of bilateral lacrimal glands: Secondary | ICD-10-CM | POA: Diagnosis not present

## 2018-04-24 DIAGNOSIS — H524 Presbyopia: Secondary | ICD-10-CM | POA: Diagnosis not present

## 2018-04-24 DIAGNOSIS — H353131 Nonexudative age-related macular degeneration, bilateral, early dry stage: Secondary | ICD-10-CM | POA: Diagnosis not present

## 2018-04-24 DIAGNOSIS — H43822 Vitreomacular adhesion, left eye: Secondary | ICD-10-CM | POA: Diagnosis not present

## 2018-04-24 DIAGNOSIS — H52221 Regular astigmatism, right eye: Secondary | ICD-10-CM | POA: Diagnosis not present

## 2018-04-24 DIAGNOSIS — H35372 Puckering of macula, left eye: Secondary | ICD-10-CM | POA: Diagnosis not present

## 2018-04-24 DIAGNOSIS — Z961 Presence of intraocular lens: Secondary | ICD-10-CM | POA: Diagnosis not present

## 2018-05-06 DIAGNOSIS — Z23 Encounter for immunization: Secondary | ICD-10-CM | POA: Diagnosis not present

## 2018-05-09 ENCOUNTER — Other Ambulatory Visit: Payer: Self-pay

## 2018-05-22 DIAGNOSIS — K5730 Diverticulosis of large intestine without perforation or abscess without bleeding: Secondary | ICD-10-CM | POA: Diagnosis not present

## 2018-05-22 DIAGNOSIS — R1013 Epigastric pain: Secondary | ICD-10-CM | POA: Diagnosis not present

## 2018-05-22 DIAGNOSIS — K449 Diaphragmatic hernia without obstruction or gangrene: Secondary | ICD-10-CM | POA: Diagnosis not present

## 2018-05-22 DIAGNOSIS — K209 Esophagitis, unspecified: Secondary | ICD-10-CM | POA: Diagnosis not present

## 2018-05-22 DIAGNOSIS — Z800 Family history of malignant neoplasm of digestive organs: Secondary | ICD-10-CM | POA: Diagnosis not present

## 2018-05-24 ENCOUNTER — Emergency Department (HOSPITAL_COMMUNITY): Payer: Medicare Other

## 2018-05-24 ENCOUNTER — Other Ambulatory Visit: Payer: Self-pay

## 2018-05-24 ENCOUNTER — Emergency Department (HOSPITAL_COMMUNITY)
Admission: EM | Admit: 2018-05-24 | Discharge: 2018-05-24 | Disposition: A | Discharge status: Home / Self Care | Payer: Medicare Other | Attending: Emergency Medicine | Admitting: Emergency Medicine

## 2018-05-24 ENCOUNTER — Encounter (HOSPITAL_COMMUNITY): Payer: Self-pay | Admitting: Emergency Medicine

## 2018-05-24 DIAGNOSIS — R42 Dizziness and giddiness: Secondary | ICD-10-CM | POA: Diagnosis not present

## 2018-05-24 DIAGNOSIS — R531 Weakness: Secondary | ICD-10-CM | POA: Insufficient documentation

## 2018-05-24 DIAGNOSIS — Z86718 Personal history of other venous thrombosis and embolism: Secondary | ICD-10-CM | POA: Diagnosis not present

## 2018-05-24 DIAGNOSIS — E785 Hyperlipidemia, unspecified: Secondary | ICD-10-CM | POA: Diagnosis not present

## 2018-05-24 DIAGNOSIS — F17210 Nicotine dependence, cigarettes, uncomplicated: Secondary | ICD-10-CM | POA: Diagnosis not present

## 2018-05-24 DIAGNOSIS — R079 Chest pain, unspecified: Secondary | ICD-10-CM | POA: Insufficient documentation

## 2018-05-24 DIAGNOSIS — Z79899 Other long term (current) drug therapy: Secondary | ICD-10-CM | POA: Insufficient documentation

## 2018-05-24 DIAGNOSIS — R112 Nausea with vomiting, unspecified: Secondary | ICD-10-CM | POA: Diagnosis not present

## 2018-05-24 DIAGNOSIS — I10 Essential (primary) hypertension: Secondary | ICD-10-CM | POA: Diagnosis not present

## 2018-05-24 LAB — DIFFERENTIAL
Abs Immature Granulocytes: 0.01 10*3/uL (ref 0.00–0.07)
Basophils Absolute: 0 10*3/uL (ref 0.0–0.1)
Basophils Relative: 1 %
Eosinophils Absolute: 0.1 10*3/uL (ref 0.0–0.5)
Eosinophils Relative: 2 %
Immature Granulocytes: 0 %
LYMPHS ABS: 1.2 10*3/uL (ref 0.7–4.0)
LYMPHS PCT: 23 %
Monocytes Absolute: 0.3 10*3/uL (ref 0.1–1.0)
Monocytes Relative: 6 %
Neutro Abs: 3.6 10*3/uL (ref 1.7–7.7)
Neutrophils Relative %: 68 %

## 2018-05-24 LAB — RAPID URINE DRUG SCREEN, HOSP PERFORMED
Amphetamines: NOT DETECTED
BARBITURATES: NOT DETECTED
Benzodiazepines: NOT DETECTED
Cocaine: NOT DETECTED
Opiates: NOT DETECTED
Tetrahydrocannabinol: NOT DETECTED

## 2018-05-24 LAB — COMPREHENSIVE METABOLIC PANEL
ALT: 46 U/L — ABNORMAL HIGH (ref 0–44)
AST: 35 U/L (ref 15–41)
Albumin: 3.7 g/dL (ref 3.5–5.0)
Alkaline Phosphatase: 65 U/L (ref 38–126)
Anion gap: 11 (ref 5–15)
BILIRUBIN TOTAL: 0.6 mg/dL (ref 0.3–1.2)
BUN: 7 mg/dL — AB (ref 8–23)
CO2: 20 mmol/L — ABNORMAL LOW (ref 22–32)
Calcium: 9.3 mg/dL (ref 8.9–10.3)
Chloride: 108 mmol/L (ref 98–111)
Creatinine, Ser: 0.66 mg/dL (ref 0.44–1.00)
GFR calc Af Amer: >60 mL/min (ref >60)
GFR calc non Af Amer: >60 mL/min (ref >60)
Glucose, Bld: 131 mg/dL — ABNORMAL HIGH (ref 70–99)
Potassium: 3.2 mmol/L — ABNORMAL LOW (ref 3.5–5.1)
Sodium: 139 mmol/L (ref 135–145)
Total Protein: 6.4 g/dL — ABNORMAL LOW (ref 6.5–8.1)

## 2018-05-24 LAB — URINALYSIS, ROUTINE W REFLEX MICROSCOPIC
Bilirubin Urine: NEGATIVE
GLUCOSE, UA: NEGATIVE mg/dL
Hgb urine dipstick: NEGATIVE
Ketones, ur: NEGATIVE mg/dL
Nitrite: NEGATIVE
Protein, ur: NEGATIVE mg/dL
Specific Gravity, Urine: 1.014 (ref 1.005–1.030)
pH: 7 (ref 5.0–8.0)

## 2018-05-24 LAB — I-STAT CHEM 8, ED
BUN: 8 mg/dL (ref 8–23)
Calcium, Ion: 1.11 mmol/L — ABNORMAL LOW (ref 1.15–1.40)
Chloride: 108 mmol/L (ref 98–111)
Creatinine, Ser: 0.5 mg/dL (ref 0.44–1.00)
Glucose, Bld: 126 mg/dL — ABNORMAL HIGH (ref 70–99)
HCT: 37 % (ref 36.0–46.0)
Hemoglobin: 12.6 g/dL (ref 12.0–15.0)
Potassium: 3.1 mmol/L — ABNORMAL LOW (ref 3.5–5.1)
SODIUM: 140 mmol/L (ref 135–145)
TCO2: 21 mmol/L — AB (ref 22–32)

## 2018-05-24 LAB — ETHANOL: Alcohol, Ethyl (B): <10 mg/dL (ref <10)

## 2018-05-24 LAB — CBC
HCT: 38.3 % (ref 36.0–46.0)
Hemoglobin: 12.3 g/dL (ref 12.0–15.0)
MCH: 28.3 pg (ref 26.0–34.0)
MCHC: 32.1 g/dL (ref 30.0–36.0)
MCV: 88.2 fL (ref 80.0–100.0)
Platelets: 300 10*3/uL (ref 150–400)
RBC: 4.34 MIL/uL (ref 3.87–5.11)
RDW: 12.9 % (ref 11.5–15.5)
WBC: 5.3 10*3/uL (ref 4.0–10.5)
nRBC: 0 % (ref 0.0–0.2)

## 2018-05-24 LAB — I-STAT TROPONIN, ED: Troponin i, poc: 0.02 ng/mL (ref 0.00–0.08)

## 2018-05-24 LAB — PROTIME-INR
INR: 0.92
Prothrombin Time: 12.3 seconds (ref 11.4–15.2)

## 2018-05-24 LAB — APTT: aPTT: 29 seconds (ref 24–36)

## 2018-05-24 MED ORDER — SODIUM CHLORIDE 0.9 % IV BOLUS
1000.0000 mL | Freq: Once | INTRAVENOUS | Status: AC
  Administered 2018-05-24: 1000 mL via INTRAVENOUS

## 2018-05-24 MED ORDER — MECLIZINE HCL 25 MG PO TABS
25.0000 mg | ORAL_TABLET | Freq: Once | ORAL | Status: AC
  Administered 2018-05-24: 25 mg via ORAL
  Filled 2018-05-24: qty 1

## 2018-05-24 MED ORDER — POTASSIUM CHLORIDE CRYS ER 20 MEQ PO TBCR
40.0000 meq | EXTENDED_RELEASE_TABLET | Freq: Once | ORAL | Status: AC
  Administered 2018-05-24: 40 meq via ORAL
  Filled 2018-05-24: qty 2

## 2018-05-24 MED ORDER — IOPAMIDOL (ISOVUE-370) INJECTION 76%
INTRAVENOUS | Status: AC
  Filled 2018-05-24: qty 100

## 2018-05-24 MED ORDER — ONDANSETRON HCL 4 MG/2ML IJ SOLN
4.0000 mg | Freq: Once | INTRAMUSCULAR | Status: AC
  Administered 2018-05-24: 4 mg via INTRAVENOUS
  Filled 2018-05-24: qty 2

## 2018-05-24 MED ORDER — MECLIZINE HCL 12.5 MG PO TABS: 12.5000 mg | ORAL_TABLET | Freq: Three times a day (TID) | ORAL | 0 refills | Status: DC | PRN

## 2018-05-24 MED ORDER — IOPAMIDOL (ISOVUE-370) INJECTION 76%
75.0000 mL | Freq: Once | INTRAVENOUS | Status: AC | PRN
  Administered 2018-05-24: 75 mL via INTRAVENOUS

## 2018-05-24 NOTE — ED Notes (Signed)
Patient transported to MRI 

## 2018-05-24 NOTE — ED Triage Notes (Signed)
Pt in from home via [facility] with c/o vertigo since waking this morning. Had colonoscopy yesterday, went home and did lots of yardwork. Woke up feeling instant vertigo, also c/o L arm tingling and back pain. Began vomiting on arrival to ED

## 2018-05-24 NOTE — ED Notes (Signed)
Patient left at this time with all belongings. 

## 2018-05-24 NOTE — ED Provider Notes (Signed)
[facility] Provider Note   CSN: [id number] Arrival date & time: 05/24/18  1009     History   Chief Complaint Chief Complaint  Patient presents with  . Dizziness  . Emesis    HPI Sherry Barnes is a 72 y.o. female.  HPI  71 year old female presents with acute dizziness.  She went to bed at midnight last night.  She felt fine and previously had been raking and burning leaves for several hours.  When she woke up this morning she sat up and had severe dizziness like the room was spinning.  She had a hard time walking to the bathroom.  Intermittently since then has noticed left arm numbness and heaviness.  She also has acute on chronic posterior neck pain over the past 1 week.  No other numbness besides some tingling in her toes bilaterally.  She is also having a little bit of chest heaviness.  She is unsure if this is similar to the chest pain she is been having for a long time that was diagnosed as a hiatal hernia recently.  Has been having bilateral ear buzzing and ringing for about 3 weeks. There is no headache or blurry/double vision.  Past Medical History:  Diagnosis Date  . Arthralgia of left elbow   . Dyspnea    at times  . GERD (gastroesophageal reflux disease)    at times  . Granulomatous lung disease ([place])    PULMOLOGIST--  DR Lamonte Sakai--  PFT'S NORMAL  . H/O hernia repair    ventral  . History of cervical fracture    C5  laminar fx non-displace 02-21-2013 no surgical intervention  . History of DVT of lower extremity    behind left knee--  2011  resolved  . History of vulvar dysplasia   . Osteoarthritis   . Osteopenia    right hip, right neck  . Osteoporosis    LEFT HIP AND NECK  . Osteoporosis    left neck and left hip  . PONV (postoperative nausea and vomiting)    weakness  . Right knee meniscal tear   . Wears glasses     Patient Active Problem List   Diagnosis Date Noted  . OA (osteoarthritis) of knee 07/26/2016  .  Hyperlipidemia 04/11/2016  . Dyspnea on exertion 04/11/2016  . Abnormal nuclear stress test 04/11/2016  . Acute medial meniscal tear 10/06/2014  . Right leg numbness   . Numbness of foot   . Strain of right knee   . C5 laminar fracture 02/20/2013  . Syncope 02/20/2013  . Open nasal fracture 02/20/2013  . Lip laceration 02/20/2013  . Diarrhea 02/20/2013  . History of DVT (deep vein thrombosis) 02/20/2013  . Granulomatous lung disease ([place]) 10/01/2012  . Ventral hernia with obstruction 04/17/2011    Past Surgical History:  Procedure Laterality Date  . ABDOMINAL HYSTERECTOMY  10-21-1996   AND BLADDER SLING  . APPENDECTOMY    . CARDIOVASCULAR STRESS TEST  08-26-2010   dr Shanon Brow harding   Low Risk perfusion scan/  observed defect is consistant with diaphragmatic attenuation, no significant wall motion abnormalities,  ef 78%  , small area mild reversible perfusion defect in the RCA territory (per cardiologist false positive)  . CERVICAL CONE BIOPSY  05/15/1972  . CHOLECYSTECTOMY  01/27/09  . CONDYLECTOMY LEFT FIFTH TOE  06-16-1999  . GYNECOLOGIC CRYOSURGERY  1995    cervical dysplasia  . HERNIA REPAIR     x2  . KNEE ARTHROSCOPY  Left 10/07/2014   Procedure: LEFT ARTHROSCOPY KNEE WITH MEDIAL MENISCAL DEBRIDEMENT;  Surgeon: Gaynelle Arabian, MD;  Location: [facility];  Service: Orthopedics;  Laterality: Left;  . KNEE ARTHROSCOPY Right 07/28/2015   Procedure: RIGHT KNEE ARTHROSCOPY WITH MENISCUS DEBRIDEMENT;  Surgeon: Gaynelle Arabian, MD;  Location: [facility];  Service: Orthopedics;  Laterality: Right;  . LAPAROTOMY W/ BILATERAL OVARIAN DERMOID CYSTECTOMY  04-06-1971   and APPENDECTOMY  . NEUROPLASTY / TRANSPOSITION ULNAR NERVE AT ELBOW Left 02-24-2002  . PARTIAL KNEE ARTHROPLASTY Left 07/26/2016   Procedure: LEFT MEDIAL UNICOMPARTMENTAL KNEE ARTHROPLASTY;  Surgeon: Gaynelle Arabian, MD;  Location: [facility];  Service: Orthopedics;  Laterality: Left;  Adductor Block  . TONSILLECTOMY AND ADENOIDECTOMY  1970  .  TRANSTHORACIC ECHOCARDIOGRAM  04-22-2008   normal /   ef 60-65%/  mild TR  . TUBAL LIGATION  12-22-1982  . UMBILICAL HERNIA REPAIR  01-27-2009  . VENTRAL HERNIA REPAIR  05/02/2011   Procedure: LAPAROSCOPIC VENTRAL HERNIA;  Surgeon: Adin Hector, MD;  Location: [facility];  Service: General;  Laterality: N/A;  attempted Laparoscopic ventraL hernia repair with mesh, open ventral hernia repair   . WIDE LOCAL EXCISION LEFT VULVA  06-26-2007     OB History   None      Home Medications    Prior to Admission medications   Medication Sig Start Date End Date Taking? Authorizing Provider  acetaminophen (TYLENOL 8 HOUR ARTHRITIS PAIN) 650 MG CR tablet Take 650 mg by mouth every 8 (eight) hours as needed for pain.   Yes [provider]  acetaminophen (TYLENOL) 500 MG tablet Take 1,000 mg by mouth every 6 (six) hours as needed for moderate pain or headache.    Yes [provider]  calcium carbonate (OS-CAL - DOSED IN MG OF ELEMENTAL CALCIUM) 1250 (500 Ca) MG tablet Take 1 tablet by mouth.   Yes [provider]  Cholecalciferol (VITAMIN D3 PO) Take 2,000 Units by mouth daily at 2 PM.   Yes [provider]  Lactobacillus (PROBIOTIC ACIDOPHILUS) CAPS Take 1 capsule by mouth daily at 2 PM.   Yes [provider]  Polyethyl Glycol-Propyl Glycol (SYSTANE) 0.4-0.3 % GEL ophthalmic gel Place 1 application into both eyes as needed.   Yes [provider]  pyridOXINE (VITAMIN B-6) 100 MG tablet Take 100 mg by mouth daily.   Yes [provider]  vitamin B-12 (CYANOCOBALAMIN) 500 MCG tablet Take 500 mcg by mouth daily.   Yes [provider]  vitamin C (ASCORBIC ACID) 500 MG tablet Take 500 mg by mouth daily.   Yes [provider]  methocarbamol (ROBAXIN) 500 MG tablet Take 1 tablet (500 mg total) by mouth every 6 (six) hours as needed for muscle spasms. Patient not taking: Reported on 05/24/2018 07/27/16   Dara Lords, Alexzandrew L, PA-C    oxyCODONE (OXY IR/ROXICODONE) 5 MG immediate release tablet Take 1-2 tablets (5-10 mg total) by mouth every 4 (four) hours as needed for moderate pain or severe pain. Patient not taking: Reported on 05/24/2018 07/27/16   Dara Lords, Alexzandrew L, PA-C  traMADol (ULTRAM) 50 MG tablet Take 1-2 tablets (50-100 mg total) by mouth every 6 (six) hours as needed (mild to moderate pain). Patient not taking: Reported on 05/24/2018 07/27/16   Joelene Millin, PA-C    Family History Family History  Problem Relation Age of Onset  . Irritable bowel syndrome Mother   . Other Mother        bladder infections, gallstones, low thyroid  .  Heart disease Mother        3 vessel stints  . Hyperthyroidism Mother   . Emphysema Father   . Heart disease Father        congestive heart failure, stints place  . Other Sister        gallstones  . Other Brother        reflux, mitral valve prolapse  . Other Paternal Grandmother        TB  . Hyperthyroidism Sister   . Hyperparathyroidism Maternal Uncle     Social History Social History   Tobacco Use  . Smoking status: Former Smoker    Packs/day: 1.00    Years: 10.00    Pack years: 10.00    Types: Cigarettes    Last attempt to quit: 02/17/1974    Years since quitting: 44.2  . Smokeless tobacco: Never Used  Substance Use Topics  . Alcohol use: Yes    Alcohol/week: 1.0 standard drinks    Types: 1 Glasses of wine per week    Comment: occaional  . Drug use: No     Allergies   Patient has no known allergies.   Review of Systems Review of Systems  Eyes: Negative for visual disturbance.  Respiratory: Negative for shortness of breath.   Cardiovascular: Positive for chest pain.  Gastrointestinal: Positive for nausea and vomiting.  Musculoskeletal: Negative for back pain.  Neurological: Positive for dizziness, weakness and numbness. Negative for headaches.  All other systems reviewed and are negative.    Physical Exam Updated Vital Signs BP (!)  142/63   Pulse 72   Temp 98.2 F (36.8 C) (Oral)   Resp 12   Wt 90.7 kg   SpO2 98%   BMI 36.57 kg/m   Physical Exam  Constitutional: She is oriented to person, place, and time. She appears well-developed and well-nourished.  HENT:  Head: Normocephalic and atraumatic.  Right Ear: Tympanic membrane and external ear normal.  Left Ear: Tympanic membrane and external ear normal.  Nose: Nose normal.  Eyes: Pupils are equal, round, and reactive to light. EOM are normal. Right eye exhibits no discharge. Left eye exhibits no discharge. Right eye exhibits no nystagmus. Left eye exhibits no nystagmus.  Neck: Neck supple.  Cardiovascular: Normal rate, regular rhythm and normal heart sounds.  Pulses:      Radial pulses are 2+ on the right side, and 2+ on the left side.       Dorsalis pedis pulses are 2+ on the right side, and 2+ on the left side.  Pulmonary/Chest: Effort normal and breath sounds normal.  Abdominal: Soft. There is no tenderness.  Neurological: She is alert and oriented to person, place, and time.  CN 3-12 grossly intact. 5/5 strength in all 4 extremities. Grossly normal sensation. Normal finger to nose.   Skin: Skin is warm and dry.  Psychiatric: Her mood appears not anxious.  Nursing note and vitals reviewed.    ED Treatments / Results  Labs (all labs ordered are listed, but only abnormal results are displayed) Labs Reviewed  COMPREHENSIVE METABOLIC PANEL - Abnormal; Notable for the following components:      Result Value   Potassium 3.2 (*)    CO2 20 (*)    Glucose, Bld 131 (*)    BUN 7 (*)    Total Protein 6.4 (*)    ALT 46 (*)    All other components within normal limits  URINALYSIS, ROUTINE W REFLEX MICROSCOPIC - Abnormal; Notable for the  following components:   Color, Urine COLORLESS (*)    Leukocytes, UA SMALL (*)    Bacteria, UA RARE (*)    All other components within normal limits  I-STAT CHEM 8, ED - Abnormal; Notable for the following components:    Potassium 3.1 (*)    Glucose, Bld 126 (*)    Calcium, Ion 1.11 (*)    TCO2 21 (*)    All other components within normal limits  ETHANOL  PROTIME-INR  APTT  CBC  DIFFERENTIAL  RAPID URINE DRUG SCREEN, HOSP PERFORMED  MAGNESIUM  I-STAT TROPONIN, ED    EKG EKG Interpretation  Date/Time:  Friday May 24 2018 10:22:53 EST Ventricular Rate:  73 PR Interval:    QRS Duration: 99 QT Interval:  428 QTC Calculation: 472 R Axis:   16 Text Interpretation:  Sinus rhythm Consider left atrial enlargement no acute ST/T changes no significant change since 2014 Confirmed by Sherwood Gambler [phone]) on 05/24/2018 10:34:56 AM   Radiology Ct Angio Head W Or Wo Contrast  Result Date: 05/24/2018 CLINICAL DATA:  72 year old female with vertigo since waking this morning. Left arm tingling and back pain. Colonoscopy yesterday. Vomiting. EXAM: CT ANGIOGRAPHY HEAD AND NECK TECHNIQUE: Multidetector CT imaging of the head and neck was performed using the standard protocol during bolus administration of intravenous contrast. Multiplanar CT image reconstructions and MIPs were obtained to evaluate the vascular anatomy. Carotid stenosis measurements (when applicable) are obtained utilizing NASCET criteria, using the distal internal carotid diameter as the denominator. CONTRAST:  41mL ISOVUE-370 IOPAMIDOL (ISOVUE-370) INJECTION 76% COMPARISON:  Head and face CT 02/20/2013. FINDINGS: CT HEAD Brain: Cerebral volume remains normal for age. No midline shift, ventriculomegaly, mass effect, evidence of mass lesion, intracranial hemorrhage or evidence of cortically based acute infarction. No cortical encephalomalacia identified. Mild for age scattered white matter hypodensity, possible involvement of the deep white matter capsules. Negative CT appearance of the brainstem and cerebellum. Calvarium and skull base: Negative. Paranasal sinuses: Stable and well pneumatized. Tympanic cavities and mastoids are clear. Orbits:  Postoperative changes to both globes, otherwise negative orbit and scalp soft tissues. CTA NECK Skeleton: Widespread degenerative changes in the cervical spine with mid cervical ankylosis, adjacent segment spondylolisthesis, disc and facet degeneration. No acute osseous abnormality identified. Upper chest: Mild upper lung gas trapping suspected. No superior mediastinal lymphadenopathy. Other neck: Negative aside from a partially retropharyngeal course of the carotids (normal variant). Aortic arch: 4 vessel arch configuration, the left vertebral arises directly from the arch. No significant arch or great vessel origin atherosclerosis. Right carotid system: Tortuous brachiocephalic artery and proximal right CCA. Retropharyngeal course of the right CCA at the thoracic inlet. Negative right carotid bifurcation. Tortuous but otherwise negative cervical right ICA. Left carotid system: Mildly tortuous left CCA with a retropharyngeal course. Negative left carotid bifurcation. Tortuous but otherwise negative cervical left ICA. Vertebral arteries: Tortuous proximal right subclavian artery with a kinked appearance at the thoracic inlet. The right vertebral artery is dominant and dolichoectatic with a capacious origin. Tortuous right V1 segment. Patent and mildly dolichoectatic right vertebral artery to the skull base. The left vertebral artery is non dominant and arises directly from the arch. Tortuous left V1 segment and late entry into the cervical transverse foramen. Patent left vertebral artery to the skull base without stenosis. CTA HEAD Posterior circulation: Patent distal vertebral arteries without stenosis, the right is dominant. Both a ICAs appear dominant. Tortuous basilar artery without stenosis. Normal SCA and PCA origins. Tortuous left P1 segment. Both  posterior communicating arteries are present. Bilateral PCA branches are within normal limits. Anterior circulation: Both ICA siphons are patent. Mild bilateral  siphon dolichoectasia and calcified plaque with no stenosis. Ophthalmic and posterior communicating artery origins are normal. Patent carotid termini. Normal MCA and ACA origins. Anterior communicating artery and bilateral ACA branches are within normal limits. Left MCA M1 segment, trifurcation, and left MCA branches are within normal limits. Right MCA M1 segment, bifurcation, and right MCA branches are within normal limits. Venous sinuses: Patent. Anatomic variants: Dominant right vertebral artery and the left arises directly from the aortic arch. Delayed phase: No abnormal enhancement identified. Review of the MIP images confirms the above findings IMPRESSION: 1. Negative for large vessel occlusion, and mild for age atherosclerosis. Generalized arterial tortuosity, but no arterial stenosis. 2. No acute intracranial abnormality. Mild for age white matter changes, most commonly due to chronic small vessel disease. 3. 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 Electronically Signed   By: Genevie Ann M.D.   On: 05/24/2018 11:32   Dg Chest 2 View  Result Date: 05/24/2018 CLINICAL DATA:  Dizziness. EXAM: CHEST - 2 VIEW COMPARISON:  05/17/2017. FINDINGS: Mediastinum and hilar structures normal. Heart size normal. No focal infiltrate. No pleural effusion or pneumothorax. No acute bony abnormality. IMPRESSION: No acute cardiopulmonary disease. Electronically Signed   By: Marcello Moores  Register   On: 05/24/2018 13:38   Ct Head Wo Contrast  Result Date: 05/24/2018 CLINICAL DATA:  Ct head wo, c/o vertigo since waking this morning. Had colonoscopy yesterday, went home and did lots of yardwork. Woke up feeling instant vertigo, also c/o L arm tingling and back pain. Began vomiting on arrival to ED EXAM: CT HEAD WITHOUT CONTRAST TECHNIQUE: Contiguous axial images were obtained from the base of the skull through the vertex without intravenous contrast. COMPARISON:  05/24/2018 FINDINGS: Brain:  Mild atrophy. There is no evidence of acute intracranial hemorrhage, brain edema, mass lesion, acute infarction, mass effect, or midline shift. Acute infarct may be inapparent on noncontrast CT. No other intra-axial abnormalities are seen, and the ventricles and sulci are within normal limits in size and symmetry. No abnormal extra-axial fluid collections or masses are identified. Vascular: Atherosclerotic and physiologic intracranial calcifications. Skull: Normal. Negative for fracture or focal lesion. Sinuses/Orbits: No acute finding. Other: None IMPRESSION: Negative for bleed or other acute intracranial process. Electronically Signed   By: Lucrezia Europe M.D.   On: 05/24/2018 14:55   Ct Angio Neck W And/or Wo Contrast  Result Date: 05/24/2018 CLINICAL DATA:  72 year old female with vertigo since waking this morning. Left arm tingling and back pain. Colonoscopy yesterday. Vomiting. EXAM: CT ANGIOGRAPHY HEAD AND NECK TECHNIQUE: Multidetector CT imaging of the head and neck was performed using the standard protocol during bolus administration of intravenous contrast. Multiplanar CT image reconstructions and MIPs were obtained to evaluate the vascular anatomy. Carotid stenosis measurements (when applicable) are obtained utilizing NASCET criteria, using the distal internal carotid diameter as the denominator. CONTRAST:  12mL ISOVUE-370 IOPAMIDOL (ISOVUE-370) INJECTION 76% COMPARISON:  Head and face CT 02/20/2013. FINDINGS: CT HEAD Brain: Cerebral volume remains normal for age. No midline shift, ventriculomegaly, mass effect, evidence of mass lesion, intracranial hemorrhage or evidence of cortically based acute infarction. No cortical encephalomalacia identified. Mild for age scattered white matter hypodensity, possible involvement of the deep white matter capsules. Negative CT appearance of the brainstem and cerebellum. Calvarium and skull base: Negative. Paranasal sinuses: Stable and well pneumatized. Tympanic  cavities and mastoids are clear.  Orbits: Postoperative changes to both globes, otherwise negative orbit and scalp soft tissues. CTA NECK Skeleton: Widespread degenerative changes in the cervical spine with mid cervical ankylosis, adjacent segment spondylolisthesis, disc and facet degeneration. No acute osseous abnormality identified. Upper chest: Mild upper lung gas trapping suspected. No superior mediastinal lymphadenopathy. Other neck: Negative aside from a partially retropharyngeal course of the carotids (normal variant). Aortic arch: 4 vessel arch configuration, the left vertebral arises directly from the arch. No significant arch or great vessel origin atherosclerosis. Right carotid system: Tortuous brachiocephalic artery and proximal right CCA. Retropharyngeal course of the right CCA at the thoracic inlet. Negative right carotid bifurcation. Tortuous but otherwise negative cervical right ICA. Left carotid system: Mildly tortuous left CCA with a retropharyngeal course. Negative left carotid bifurcation. Tortuous but otherwise negative cervical left ICA. Vertebral arteries: Tortuous proximal right subclavian artery with a kinked appearance at the thoracic inlet. The right vertebral artery is dominant and dolichoectatic with a capacious origin. Tortuous right V1 segment. Patent and mildly dolichoectatic right vertebral artery to the skull base. The left vertebral artery is non dominant and arises directly from the arch. Tortuous left V1 segment and late entry into the cervical transverse foramen. Patent left vertebral artery to the skull base without stenosis. CTA HEAD Posterior circulation: Patent distal vertebral arteries without stenosis, the right is dominant. Both a ICAs appear dominant. Tortuous basilar artery without stenosis. Normal SCA and PCA origins. Tortuous left P1 segment. Both posterior communicating arteries are present. Bilateral PCA branches are within normal limits. Anterior circulation: Both  ICA siphons are patent. Mild bilateral siphon dolichoectasia and calcified plaque with no stenosis. Ophthalmic and posterior communicating artery origins are normal. Patent carotid termini. Normal MCA and ACA origins. Anterior communicating artery and bilateral ACA branches are within normal limits. Left MCA M1 segment, trifurcation, and left MCA branches are within normal limits. Right MCA M1 segment, bifurcation, and right MCA branches are within normal limits. Venous sinuses: Patent. Anatomic variants: Dominant right vertebral artery and the left arises directly from the aortic arch. Delayed phase: No abnormal enhancement identified. Review of the MIP images confirms the above findings IMPRESSION: 1. Negative for large vessel occlusion, and mild for age atherosclerosis. Generalized arterial tortuosity, but no arterial stenosis. 2. No acute intracranial abnormality. Mild for age white matter changes, most commonly due to chronic small vessel disease. 3. 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 Electronically Signed   By: Genevie Ann M.D.   On: 05/24/2018 11:32    Procedures Procedures (including critical care time)  Medications Ordered in ED Medications  iopamidol (ISOVUE-370) 76 % injection (has no administration in time range)  ondansetron (ZOFRAN) injection 4 mg (4 mg Intravenous Given 05/24/18 1237)  meclizine (ANTIVERT) tablet 25 mg (25 mg Oral Given 05/24/18 1236)  iopamidol (ISOVUE-370) 76 % injection 75 mL (75 mLs Intravenous Contrast Given 05/24/18 1059)  sodium chloride 0.9 % bolus 1,000 mL (1,000 mLs Intravenous New Bag/Given 05/24/18 1424)  potassium chloride SA (K-DUR,KLOR-CON) CR tablet 40 mEq (40 mEq Oral Given 05/24/18 1236)     Initial Impression / Assessment and Plan / ED Course  I have reviewed the triage vital signs and the nursing notes.  Pertinent labs & imaging results that were available during my care of the patient were reviewed by me  and considered in my medical decision making (see chart for details).  Clinical Course as of May 24 1629  Fri May 24, 2018  1045 I discussed presentation with  Dr. Rory Percy. Agrees with LNW last night, not a TPA candidate/code stroke. However, recommends CTA head/neck to eval for basilar artery abnormalities.    [SG]    Clinical Course User Index [SG] Sherwood Gambler, MD    Patient does seem to be doing a little bit better on exam.  Given the acute vertiginous symptoms in association with the vague on and off left-sided numbness, MRI will be performed after negative CT and CT angiography.  If this is negative and she is able to ambulate without significant difficulty I think she can go home with treatment with vertigo.  Care transferred to Dr. Wilson Singer with MRI pending.  Final Clinical Impressions(s) / ED Diagnoses   Final diagnoses:  None    ED Discharge Orders    None       Sherwood Gambler, MD 05/24/18 [phone]

## 2018-05-24 NOTE — ED Notes (Signed)
Patient transported to CT 

## 2018-05-27 DIAGNOSIS — R739 Hyperglycemia, unspecified: Secondary | ICD-10-CM | POA: Diagnosis not present

## 2018-05-27 DIAGNOSIS — E876 Hypokalemia: Secondary | ICD-10-CM | POA: Diagnosis not present

## 2018-05-27 DIAGNOSIS — R42 Dizziness and giddiness: Secondary | ICD-10-CM | POA: Diagnosis not present

## 2018-05-27 DIAGNOSIS — M25551 Pain in right hip: Secondary | ICD-10-CM | POA: Diagnosis not present

## 2018-05-27 DIAGNOSIS — E559 Vitamin D deficiency, unspecified: Secondary | ICD-10-CM | POA: Diagnosis not present

## 2018-05-27 DIAGNOSIS — R740 Nonspecific elevation of levels of transaminase and lactic acid dehydrogenase [LDH]: Secondary | ICD-10-CM | POA: Diagnosis not present

## 2018-05-27 DIAGNOSIS — M159 Polyosteoarthritis, unspecified: Secondary | ICD-10-CM | POA: Diagnosis not present

## 2018-05-27 DIAGNOSIS — Z0000 Encounter for general adult medical examination without abnormal findings: Secondary | ICD-10-CM | POA: Diagnosis not present

## 2018-05-27 DIAGNOSIS — K449 Diaphragmatic hernia without obstruction or gangrene: Secondary | ICD-10-CM | POA: Diagnosis not present

## 2018-05-27 DIAGNOSIS — Z1159 Encounter for screening for other viral diseases: Secondary | ICD-10-CM | POA: Diagnosis not present

## 2018-05-27 DIAGNOSIS — M810 Age-related osteoporosis without current pathological fracture: Secondary | ICD-10-CM | POA: Diagnosis not present

## 2018-05-27 DIAGNOSIS — M546 Pain in thoracic spine: Secondary | ICD-10-CM | POA: Diagnosis not present

## 2018-06-25 DIAGNOSIS — R1013 Epigastric pain: Secondary | ICD-10-CM | POA: Diagnosis not present

## 2018-06-25 DIAGNOSIS — Z1211 Encounter for screening for malignant neoplasm of colon: Secondary | ICD-10-CM | POA: Diagnosis not present

## 2018-07-02 DIAGNOSIS — H9313 Tinnitus, bilateral: Secondary | ICD-10-CM | POA: Diagnosis not present

## 2018-07-02 DIAGNOSIS — R42 Dizziness and giddiness: Secondary | ICD-10-CM | POA: Insufficient documentation

## 2018-07-04 DIAGNOSIS — M1711 Unilateral primary osteoarthritis, right knee: Secondary | ICD-10-CM | POA: Diagnosis not present

## 2018-07-04 DIAGNOSIS — M1712 Unilateral primary osteoarthritis, left knee: Secondary | ICD-10-CM | POA: Diagnosis not present

## 2018-07-04 DIAGNOSIS — Z96652 Presence of left artificial knee joint: Secondary | ICD-10-CM | POA: Diagnosis not present

## 2018-07-04 DIAGNOSIS — M25561 Pain in right knee: Secondary | ICD-10-CM | POA: Diagnosis not present

## 2018-07-04 DIAGNOSIS — Z471 Aftercare following joint replacement surgery: Secondary | ICD-10-CM | POA: Diagnosis not present

## 2018-07-19 DIAGNOSIS — H9193 Unspecified hearing loss, bilateral: Secondary | ICD-10-CM | POA: Diagnosis not present

## 2018-07-19 DIAGNOSIS — J342 Deviated nasal septum: Secondary | ICD-10-CM | POA: Insufficient documentation

## 2018-07-19 DIAGNOSIS — H903 Sensorineural hearing loss, bilateral: Secondary | ICD-10-CM | POA: Insufficient documentation

## 2018-07-19 DIAGNOSIS — H9313 Tinnitus, bilateral: Secondary | ICD-10-CM | POA: Diagnosis not present

## 2018-09-10 DIAGNOSIS — B372 Candidiasis of skin and nail: Secondary | ICD-10-CM | POA: Diagnosis not present

## 2018-09-10 DIAGNOSIS — N644 Mastodynia: Secondary | ICD-10-CM | POA: Diagnosis not present

## 2018-09-10 DIAGNOSIS — L304 Erythema intertrigo: Secondary | ICD-10-CM | POA: Diagnosis not present

## 2018-09-10 DIAGNOSIS — N643 Galactorrhea not associated with childbirth: Secondary | ICD-10-CM | POA: Diagnosis not present

## 2018-09-11 ENCOUNTER — Other Ambulatory Visit: Payer: Self-pay | Admitting: Obstetrics and Gynecology

## 2018-09-11 DIAGNOSIS — N6452 Nipple discharge: Secondary | ICD-10-CM

## 2018-09-13 ENCOUNTER — Other Ambulatory Visit: Payer: Self-pay

## 2018-09-13 ENCOUNTER — Ambulatory Visit
Admission: RE | Admit: 2018-09-13 | Discharge: 2018-09-13 | Disposition: A | Discharge status: Home / Self Care | Payer: Medicare Other | Source: Ambulatory Visit | Attending: Obstetrics and Gynecology | Admitting: Obstetrics and Gynecology

## 2018-09-13 DIAGNOSIS — N6452 Nipple discharge: Secondary | ICD-10-CM

## 2018-09-24 DIAGNOSIS — N6452 Nipple discharge: Secondary | ICD-10-CM | POA: Diagnosis not present

## 2018-09-25 ENCOUNTER — Other Ambulatory Visit: Payer: Self-pay | Admitting: General Surgery

## 2018-09-25 DIAGNOSIS — N6452 Nipple discharge: Secondary | ICD-10-CM

## 2018-10-22 DIAGNOSIS — H43822 Vitreomacular adhesion, left eye: Secondary | ICD-10-CM | POA: Diagnosis not present

## 2018-10-22 DIAGNOSIS — H35372 Puckering of macula, left eye: Secondary | ICD-10-CM | POA: Diagnosis not present

## 2018-11-25 ENCOUNTER — Other Ambulatory Visit: Payer: Self-pay

## 2018-11-25 ENCOUNTER — Ambulatory Visit
Admission: RE | Admit: 2018-11-25 | Discharge: 2018-11-25 | Disposition: A | Discharge status: Home / Self Care | Payer: Medicare Other | Source: Ambulatory Visit | Attending: General Surgery | Admitting: General Surgery

## 2018-11-25 DIAGNOSIS — N6452 Nipple discharge: Secondary | ICD-10-CM

## 2018-11-25 MED ORDER — GADOBUTROL 1 MMOL/ML IV SOLN
10.0000 mL | Freq: Once | INTRAVENOUS | Status: AC | PRN
  Administered 2018-11-25: 10 mL via INTRAVENOUS

## 2018-12-02 DIAGNOSIS — Z87412 Personal history of vulvar dysplasia: Secondary | ICD-10-CM | POA: Diagnosis not present

## 2018-12-02 DIAGNOSIS — N958 Other specified menopausal and perimenopausal disorders: Secondary | ICD-10-CM | POA: Diagnosis not present

## 2018-12-02 DIAGNOSIS — N643 Galactorrhea not associated with childbirth: Secondary | ICD-10-CM | POA: Diagnosis not present

## 2018-12-02 DIAGNOSIS — Z124 Encounter for screening for malignant neoplasm of cervix: Secondary | ICD-10-CM | POA: Diagnosis not present

## 2018-12-02 DIAGNOSIS — N890 Mild vaginal dysplasia: Secondary | ICD-10-CM | POA: Diagnosis not present

## 2018-12-02 DIAGNOSIS — M8588 Other specified disorders of bone density and structure, other site: Secondary | ICD-10-CM | POA: Diagnosis not present

## 2018-12-02 DIAGNOSIS — Z1231 Encounter for screening mammogram for malignant neoplasm of breast: Secondary | ICD-10-CM | POA: Diagnosis not present

## 2018-12-02 DIAGNOSIS — Z779 Other contact with and (suspected) exposures hazardous to health: Secondary | ICD-10-CM | POA: Diagnosis not present

## 2019-02-19 DIAGNOSIS — M1612 Unilateral primary osteoarthritis, left hip: Secondary | ICD-10-CM | POA: Diagnosis not present

## 2019-02-19 DIAGNOSIS — M25552 Pain in left hip: Secondary | ICD-10-CM | POA: Diagnosis not present

## 2019-02-19 DIAGNOSIS — M159 Polyosteoarthritis, unspecified: Secondary | ICD-10-CM | POA: Diagnosis not present

## 2019-02-19 DIAGNOSIS — M533 Sacrococcygeal disorders, not elsewhere classified: Secondary | ICD-10-CM | POA: Diagnosis not present

## 2019-02-19 DIAGNOSIS — M160 Bilateral primary osteoarthritis of hip: Secondary | ICD-10-CM | POA: Diagnosis not present

## 2019-03-27 DIAGNOSIS — Z23 Encounter for immunization: Secondary | ICD-10-CM | POA: Diagnosis not present

## 2019-05-05 DIAGNOSIS — R0989 Other specified symptoms and signs involving the circulatory and respiratory systems: Secondary | ICD-10-CM | POA: Diagnosis not present

## 2019-05-05 DIAGNOSIS — K449 Diaphragmatic hernia without obstruction or gangrene: Secondary | ICD-10-CM | POA: Diagnosis not present

## 2019-05-05 DIAGNOSIS — T17200A Unspecified foreign body in pharynx causing asphyxiation, initial encounter: Secondary | ICD-10-CM | POA: Diagnosis not present

## 2019-05-12 ENCOUNTER — Other Ambulatory Visit: Payer: Self-pay

## 2019-05-28 DIAGNOSIS — R7401 Elevation of levels of liver transaminase levels: Secondary | ICD-10-CM | POA: Diagnosis not present

## 2019-05-28 DIAGNOSIS — M810 Age-related osteoporosis without current pathological fracture: Secondary | ICD-10-CM | POA: Diagnosis not present

## 2019-05-28 DIAGNOSIS — B399 Histoplasmosis, unspecified: Secondary | ICD-10-CM | POA: Diagnosis not present

## 2019-07-31 DIAGNOSIS — M1712 Unilateral primary osteoarthritis, left knee: Secondary | ICD-10-CM | POA: Diagnosis not present

## 2019-07-31 DIAGNOSIS — M170 Bilateral primary osteoarthritis of knee: Secondary | ICD-10-CM | POA: Diagnosis not present

## 2019-07-31 DIAGNOSIS — Z96652 Presence of left artificial knee joint: Secondary | ICD-10-CM | POA: Diagnosis not present

## 2019-07-31 DIAGNOSIS — M1711 Unilateral primary osteoarthritis, right knee: Secondary | ICD-10-CM | POA: Diagnosis not present

## 2019-09-25 DIAGNOSIS — M1712 Unilateral primary osteoarthritis, left knee: Secondary | ICD-10-CM | POA: Diagnosis not present

## 2019-09-25 DIAGNOSIS — Z96652 Presence of left artificial knee joint: Secondary | ICD-10-CM | POA: Diagnosis not present

## 2019-10-01 DIAGNOSIS — Z01818 Encounter for other preprocedural examination: Secondary | ICD-10-CM | POA: Diagnosis not present

## 2019-10-01 DIAGNOSIS — Z86718 Personal history of other venous thrombosis and embolism: Secondary | ICD-10-CM | POA: Diagnosis not present

## 2019-10-17 NOTE — Progress Notes (Signed)
PCP - Deland Pretty, MD  Surgical Clearance on chart dated 10-06-19 Cardiologist -   Chest x-ray -   EKG - 06-02-19 on chart  Stress Test -  ECHO -  Cardiac Cath -   Sleep Study -  CPAP -   Fasting Blood Sugar -  Checks Blood Sugar _____ times a day  Blood Thinner Instructions: Aspirin Instructions: Last Dose:  Anesthesia review:   Patient denies shortness of breath, fever, cough and chest pain at PAT appointment   Patient verbalized understanding of instructions that were given to them at the PAT appointment. Patient was also instructed that they will need to review over the PAT instructions again at home before surgery.

## 2019-10-17 NOTE — Patient Instructions (Addendum)
DUE TO COVID-19 ONLY ONE VISITOR IS ALLOWED TO COME WITH YOU AND STAY IN THE WAITING ROOM ONLY DURING PRE OP AND PROCEDURE DAY OF SURGERY. THE 1 VISITOR MAY VISIT WITH YOU AFTER SURGERY IN YOUR PRIVATE ROOM DURING VISITING HOURS ONLY!  YOU NEED TO HAVE A COVID 19 TEST ON 5-8-21_ @___1100am____ , THIS TEST MUST BE DONE BEFORE SURGERY, COME  [address] [place] , [id number].  ([place]) ONCE YOUR COVID TEST IS COMPLETED, PLEASE BEGIN THE QUARANTINE INSTRUCTIONS AS OUTLINED IN YOUR HANDOUT.                TASA SADD  10/17/2019   Your procedure is scheduled on: 10-29-19   Report to [facility] Main  Entrance    Report to admitting at 9:30 AM     Call this number if you have problems the morning of surgery [phone]    Remember: NO SOLID FOOD AFTER MIDNIGHT THE NIGHT PRIOR TO SURGERY. NOTHING BY MOUTH EXCEPT CLEAR LIQUIDS UNTIL 9:00 AM. PLEASE FINISH ENSURE DRINK PER SURGEON ORDER  WHICH NEEDS TO BE COMPLETED AT 9:00 AM .   CLEAR LIQUID DIET   Foods Allowed                                                                     Foods Excluded  Coffee and tea, regular and decaf                             liquids that you cannot  Plain Jell-O any favor except red or purple                                           see through such as: Fruit ices (not with fruit pulp)                                     milk, soups, orange juice  Iced Popsicles                                    All solid food Carbonated beverages, regular and diet                                    Cranberry, grape and apple juices Sports drinks like Gatorade Lightly seasoned clear broth or consume(fat free) Sugar, honey syrup   _____________________________________________________________________       Take these medicines the morning of surgery with A SIP OF WATER: None  BRUSH YOUR TEETH MORNING OF SURGERY AND RINSE YOUR MOUTH OUT, NO CHEWING GUM CANDY OR MINTS.   DO NOT  TAKE ANY DIABETIC MEDICATIONS DAY OF YOUR SURGERY                               You may  not have any metal on your body including hair pins and              piercings    Do not wear jewelry, make-up, lotions, powders or perfumes, deodorant             Do not wear nail polish on your fingernails.  Do not shave  48 hours prior to surgery.                 Do not bring valuables to the hospital. [place].  Contacts, dentures or bridgework may not be worn into surgery.  You may bring a small overnight bag     Special Instructions: N/A              Please read over the following fact sheets you were given: _____________________________________________________________________             [facility] - Preparing for Surgery Before surgery, you can play an important role.  Because skin is not sterile, your skin needs to be as free of germs as possible.  You can reduce the number of germs on your skin by washing with CHG (chlorahexidine gluconate) soap before surgery.  CHG is an antiseptic cleaner which kills germs and bonds with the skin to continue killing germs even after washing. Please DO NOT use if you have an allergy to CHG or antibacterial soaps.  If your skin becomes reddened/irritated stop using the CHG and inform your nurse when you arrive at Short Stay. Do not shave (including legs and underarms) for at least 48 hours prior to the first CHG shower.  You may shave your face/neck. Please follow these instructions carefully:  1.  Shower with CHG Soap the night before surgery and the  morning of Surgery.  2.  If you choose to wash your hair, wash your hair first as usual with your  normal  shampoo.  3.  After you shampoo, rinse your hair and body thoroughly to remove the  shampoo.                           4.  Use CHG as you would any other liquid soap.  You can apply chg directly  to the skin and wash                       Gently with a  scrungie or clean washcloth.  5.  Apply the CHG Soap to your body ONLY FROM THE NECK DOWN.   Do not use on face/ open                           Wound or open sores. Avoid contact with eyes, ears mouth and genitals (private parts).                       Wash face,  Genitals (private parts) with your normal soap.             6.  Wash thoroughly, paying special attention to the area where your surgery  will be performed.  7.  Thoroughly rinse your body with warm water from the neck down.  8.  DO NOT shower/wash with your normal soap after using and rinsing off  the CHG  Soap.                9.  Pat yourself dry with a clean towel.            10.  Wear clean pajamas.            11.  Place clean sheets on your bed the night of your first shower and do not  sleep with pets. Day of Surgery : Do not apply any lotions/deodorants the morning of surgery.  Please wear clean clothes to the hospital/surgery center.  FAILURE TO FOLLOW THESE INSTRUCTIONS MAY RESULT IN THE CANCELLATION OF YOUR SURGERY PATIENT SIGNATURE_________________________________  NURSE SIGNATURE__________________________________  ________________________________________________________________________   Adam Phenix  An incentive spirometer is a tool that can help keep your lungs clear and active. This tool measures how well you are filling your lungs with each breath. Taking long deep breaths may help reverse or decrease the chance of developing breathing (pulmonary) problems (especially infection) following:  A long period of time when you are unable to move or be active. BEFORE THE PROCEDURE   If the spirometer includes an indicator to show your best effort, your nurse or respiratory therapist will set it to a desired goal.  If possible, sit up straight or lean slightly forward. Try not to slouch.  Hold the incentive spirometer in an upright position. INSTRUCTIONS FOR USE  1. Sit on the edge of your bed if possible,  or sit up as far as you can in bed or on a chair. 2. Hold the incentive spirometer in an upright position. 3. Breathe out normally. 4. Place the mouthpiece in your mouth and seal your lips tightly around it. 5. Breathe in slowly and as deeply as possible, raising the piston or the ball toward the top of the column. 6. Hold your breath for 3-5 seconds or for as long as possible. Allow the piston or ball to fall to the bottom of the column. 7. Remove the mouthpiece from your mouth and breathe out normally. 8. Rest for a few seconds and repeat Steps 1 through 7 at least 10 times every 1-2 hours when you are awake. Take your time and take a few normal breaths between deep breaths. 9. The spirometer may include an indicator to show your best effort. Use the indicator as a goal to work toward during each repetition. 10. After each set of 10 deep breaths, practice coughing to be sure your lungs are clear. If you have an incision (the cut made at the time of surgery), support your incision when coughing by placing a pillow or rolled up towels firmly against it. Once you are able to get out of bed, walk around indoors and cough well. You may stop using the incentive spirometer when instructed by your caregiver.  RISKS AND COMPLICATIONS  Take your time so you do not get dizzy or light-headed.  If you are in pain, you may need to take or ask for pain medication before doing incentive spirometry. It is harder to take a deep breath if you are having pain. AFTER USE  Rest and breathe slowly and easily.  It can be helpful to keep track of a log of your progress. Your caregiver can provide you with a simple table to help with this. If you are using the spirometer at home, follow these instructions: [place] IF:   You are having difficultly using the spirometer.  You have trouble using the spirometer as often as instructed.  Your pain medication is not giving enough relief while using the  spirometer.  You develop fever of 100.5 F (38.1 C) or higher. SEEK IMMEDIATE MEDICAL CARE IF:   You cough up bloody sputum that had not been present before.  You develop fever of 102 F (38.9 C) or greater.  You develop worsening pain at or near the incision site. MAKE SURE YOU:   Understand these instructions.  Will watch your condition.  Will get help right away if you are not doing well or get worse. Document Released: 10/16/2006 Document Revised: 08/28/2011 Document Reviewed: 12/17/2006 [facility] Patient Information 2014 [place], [place].   ________________________________________________________________________

## 2019-10-20 ENCOUNTER — Encounter (HOSPITAL_COMMUNITY)
Admission: RE | Admit: 2019-10-20 | Discharge: 2019-10-20 | Disposition: A | Discharge status: Home / Self Care | Payer: Medicare Other | Source: Ambulatory Visit | Attending: Orthopedic Surgery | Admitting: Orthopedic Surgery

## 2019-10-20 ENCOUNTER — Encounter (HOSPITAL_COMMUNITY): Payer: Self-pay

## 2019-10-20 ENCOUNTER — Other Ambulatory Visit: Payer: Self-pay

## 2019-10-20 DIAGNOSIS — Z01812 Encounter for preprocedural laboratory examination: Secondary | ICD-10-CM | POA: Diagnosis not present

## 2019-10-20 HISTORY — DX: Personal history of other diseases of the digestive system: Z87.19

## 2019-10-20 HISTORY — DX: Syncope and collapse: R55

## 2019-10-20 HISTORY — DX: Fracture of neck, unspecified, initial encounter: S12.9XXA

## 2019-10-20 HISTORY — DX: Dizziness and giddiness: R42

## 2019-10-20 LAB — CBC
HCT: 39.8 % (ref 36.0–46.0)
Hemoglobin: 13 g/dL (ref 12.0–15.0)
MCH: 30 pg (ref 26.0–34.0)
MCHC: 32.7 g/dL (ref 30.0–36.0)
MCV: 91.9 fL (ref 80.0–100.0)
Platelets: 292 10*3/uL (ref 150–400)
RBC: 4.33 MIL/uL (ref 3.87–5.11)
RDW: 13 % (ref 11.5–15.5)
WBC: 7.4 10*3/uL (ref 4.0–10.5)
nRBC: 0 % (ref 0.0–0.2)

## 2019-10-20 LAB — COMPREHENSIVE METABOLIC PANEL
ALT: 22 U/L (ref 0–44)
AST: 22 U/L (ref 15–41)
Albumin: 4.2 g/dL (ref 3.5–5.0)
Alkaline Phosphatase: 69 U/L (ref 38–126)
Anion gap: 6 (ref 5–15)
BUN: 10 mg/dL (ref 8–23)
CO2: 28 mmol/L (ref 22–32)
Calcium: 9.7 mg/dL (ref 8.9–10.3)
Chloride: 107 mmol/L (ref 98–111)
Creatinine, Ser: 0.57 mg/dL (ref 0.44–1.00)
GFR calc Af Amer: >60 mL/min (ref >60)
GFR calc non Af Amer: >60 mL/min (ref >60)
Glucose, Bld: 99 mg/dL (ref 70–99)
Potassium: 5 mmol/L (ref 3.5–5.1)
Sodium: 141 mmol/L (ref 135–145)
Total Bilirubin: 0.6 mg/dL (ref 0.3–1.2)
Total Protein: 7.1 g/dL (ref 6.5–8.1)

## 2019-10-20 LAB — PROTIME-INR
INR: 0.9 (ref 0.8–1.2)
Prothrombin Time: 12.2 seconds (ref 11.4–15.2)

## 2019-10-20 LAB — APTT: aPTT: 29 seconds (ref 24–36)

## 2019-10-20 LAB — SURGICAL PCR SCREEN
MRSA, PCR: NEGATIVE
Staphylococcus aureus: NEGATIVE

## 2019-10-21 ENCOUNTER — Encounter (HOSPITAL_COMMUNITY): Payer: Self-pay

## 2019-10-21 NOTE — Progress Notes (Addendum)
Addendum to Anesthesia Review:  Clearance on chart from Thedora Hinders dated 10/06/2019.  CXR-05/05/19-on chart  EKG-06/02/19-on chart  Office visit- Dr Shelia Media dated 10/01/19 on chart- clearance in note  HGBA1Cdone 10/01/19-5.3 on chart  Along with other labs dated 10/01/19.

## 2019-10-22 NOTE — H&P (Signed)
UNICOMPARTMENTAL KNEE ADMISSION H&P  Patient is being admitted for right unicompartmental knee arthroplasty.  Subjective:  Chief Complaint: Right knee pain.  HPI: Sherry Barnes, 74 y.o. female has a history of pain and functional disability in the right knee due to arthritis and has failed non-surgical conservative treatments for greater than 12 weeks to include corticosteriod injections and activity modification. Onset of symptoms was gradual, starting several years ago with gradually worsening course since that time. The patient noted no past surgery on the right knee.  Patient currently rates pain in the right knee at 5 out of 10 with activity. Patient has worsening of pain with activity and weight bearing, pain that interferes with activities of daily living and crepitus. Patient has evidence of bone-on-bone osteoarthritis in the medial compartment of the right knee. Her patellofemoral and lateral compartments are normal by imaging studies. There is no active infection.  Patient Active Problem List   Diagnosis Date Noted  . OA (osteoarthritis) of knee 07/26/2016  . Hyperlipidemia 04/11/2016  . Dyspnea on exertion 04/11/2016  . Abnormal nuclear stress test 04/11/2016  . Acute medial meniscal tear 10/06/2014  . Right leg numbness   . Numbness of foot   . Strain of right knee   . C5 laminar fracture 02/20/2013  . Syncope 02/20/2013  . Open nasal fracture 02/20/2013  . Lip laceration 02/20/2013  . Diarrhea 02/20/2013  . History of DVT (deep vein thrombosis) 02/20/2013  . Granulomatous lung disease ([place]) 10/01/2012  . Ventral hernia with obstruction 04/17/2011    Past Medical History:  Diagnosis Date  . Arthralgia of left elbow   . Dyspnea    at times with exertion   . Fainting spell    hx of 2014 - neck and nose fracture   . Fainting spell    hx oxf 2014   . GERD (gastroesophageal reflux disease)    at times  . Granulomatous lung disease ([place])    PULMOLOGIST--  DR Lamonte Sakai--   PFT'S NORMAL  . H/O hernia repair    ventral  . History of cervical fracture    C5  laminar fx non-displace 02-21-2013 no surgical intervention  . History of DVT of lower extremity    behind left knee--  2011  resolved  . History of hiatal hernia    mild   . History of vulvar dysplasia   . Neck fracture (HCC)    hx of after fainting spell in 2014 per patient   . Osteoarthritis   . Osteopenia    right hip, right neck  . Osteoporosis    LEFT HIP AND NECK  . Osteoporosis    left neck and left hip  . PONV (postoperative nausea and vomiting)    weakness, trouble getting oxygenlevel up in PACU after 2016 surgery   . Right knee meniscal tear   . Vertigo    hx of in 2016   . Wears glasses     Past Surgical History:  Procedure Laterality Date  . ABDOMINAL HYSTERECTOMY  10-21-1996   AND BLADDER SLING  . APPENDECTOMY    . CARDIOVASCULAR STRESS TEST  08-26-2010   dr Shanon Brow harding   Low Risk perfusion scan/  observed defect is consistant with diaphragmatic attenuation, no significant wall motion abnormalities,  ef 78%  , small area mild reversible perfusion defect in the RCA territory (per cardiologist false positive)  . CERVICAL CONE BIOPSY  05/15/1972  . CHOLECYSTECTOMY  01/27/09  . CONDYLECTOMY LEFT FIFTH TOE  06-16-1999  .  EYE SURGERY     bilateral cataract surgery 2019   . GYNECOLOGIC CRYOSURGERY  1995    cervical dysplasia  . HERNIA REPAIR     x2  . KNEE ARTHROSCOPY Left 10/07/2014   Procedure: LEFT ARTHROSCOPY KNEE WITH MEDIAL MENISCAL DEBRIDEMENT;  Surgeon: Gaynelle Arabian, MD;  Location: [facility];  Service: Orthopedics;  Laterality: Left;  . KNEE ARTHROSCOPY Right 07/28/2015   Procedure: RIGHT KNEE ARTHROSCOPY WITH MENISCUS DEBRIDEMENT;  Surgeon: Gaynelle Arabian, MD;  Location: [facility];  Service: Orthopedics;  Laterality: Right;  . LAPAROTOMY W/ BILATERAL OVARIAN DERMOID CYSTECTOMY  04-06-1971   and APPENDECTOMY  . laser varicso vein surgery     2018   . NEUROPLASTY /  TRANSPOSITION ULNAR NERVE AT ELBOW Left 02-24-2002  . PARTIAL KNEE ARTHROPLASTY Left 07/26/2016   Procedure: LEFT MEDIAL UNICOMPARTMENTAL KNEE ARTHROPLASTY;  Surgeon: Gaynelle Arabian, MD;  Location: [facility];  Service: Orthopedics;  Laterality: Left;  Adductor Block  . TONSILLECTOMY AND ADENOIDECTOMY  1970  . TRANSTHORACIC ECHOCARDIOGRAM  04-22-2008   normal /   ef 60-65%/  mild TR  . TUBAL LIGATION  12-22-1982  . UMBILICAL HERNIA REPAIR  01-27-2009  . VENTRAL HERNIA REPAIR  05/02/2011   Procedure: LAPAROSCOPIC VENTRAL HERNIA;  Surgeon: Adin Hector, MD;  Location: [facility];  Service: General;  Laterality: N/A;  attempted Laparoscopic ventraL hernia repair with mesh, open ventral hernia repair   . WIDE LOCAL EXCISION LEFT VULVA  06-26-2007    Prior to Admission medications   Medication Sig Start Date End Date Taking? Authorizing Provider  acetaminophen (TYLENOL 8 HOUR ARTHRITIS PAIN) 650 MG CR tablet Take 650 mg by mouth every 8 (eight) hours as needed for pain.   Yes [provider]  acetaminophen (TYLENOL) 500 MG tablet Take 1,000 mg by mouth every 6 (six) hours as needed for moderate pain or headache.    Yes [provider]  Calcium Carb-Cholecalciferol (CALCIUM 600+D) 600-800 MG-UNIT TABS Take 1 tablet by mouth daily.   Yes [provider]  naproxen sodium (ALEVE) 220 MG tablet Take 220 mg by mouth daily as needed (pain).   Yes [provider]  Polyethyl Glycol-Propyl Glycol (SYSTANE) 0.4-0.3 % SOLN Place 1 application into both eyes daily as needed (dry eye).    Yes [provider]  pyridOXINE (VITAMIN B-6) 100 MG tablet Take 100 mg by mouth daily.   Yes [provider]  vitamin B-12 (CYANOCOBALAMIN) 500 MCG tablet Take 500 mcg by mouth daily.   Yes [provider]  vitamin C (ASCORBIC ACID) 500 MG tablet Take 500 mg by mouth daily.   Yes [provider]  Vitamin D3 (VITAMIN D) 25 MCG tablet Take 2,000 Units by mouth  daily.    Yes [provider]  meclizine (ANTIVERT) 12.5 MG tablet Take 1 tablet (12.5 mg total) by mouth 3 (three) times daily as needed for dizziness. Patient not taking: Reported on 10/07/2019 05/24/18   Virgel Manifold, MD  methocarbamol (ROBAXIN) 500 MG tablet Take 1 tablet (500 mg total) by mouth every 6 (six) hours as needed for muscle spasms. Patient not taking: Reported on 05/24/2018 07/27/16   Dara Lords, Alexzandrew L, PA-C  oxyCODONE (OXY IR/ROXICODONE) 5 MG immediate release tablet Take 1-2 tablets (5-10 mg total) by mouth every 4 (four) hours as needed for moderate pain or severe pain. Patient not taking: Reported on 05/24/2018 07/27/16   Dara Lords, Alexzandrew L, PA-C  traMADol (ULTRAM) 50 MG tablet Take 1-2 tablets (50-100 mg total)  by mouth every 6 (six) hours as needed (mild to moderate pain). Patient not taking: Reported on 05/24/2018 07/27/16   Dara Lords, Alexzandrew L, PA-C    No Known Allergies  Social History   Socioeconomic History  . Marital status: Married    Spouse name: Not on file  . Number of children: 2  . Years of education: 90  . Highest education level: Not on file  Occupational History  . Not on file  Tobacco Use  . Smoking status: Former Smoker    Packs/day: 1.00    Years: 10.00    Pack years: 10.00    Types: Cigarettes    Quit date: 02/17/1974    Years since quitting: 45.7  . Smokeless tobacco: Never Used  Substance and Sexual Activity  . Alcohol use: Yes    Alcohol/week: 1.0 standard drinks    Types: 1 Glasses of wine per week    Comment: seldom   . Drug use: No  . Sexual activity: Yes  Other Topics Concern  . Not on file  Social History Narrative  . Not on file   Social Determinants of Health   Financial Resource Strain:   . Difficulty of Paying Living Expenses:   Food Insecurity:   . Worried About Charity fundraiser in the Last Year:   . Arboriculturist in the Last Year:   Transportation Needs:   . Film/video editor (Medical):   Marland Kitchen  Lack of Transportation (Non-Medical):   Physical Activity:   . Days of Exercise per Week:   . Minutes of Exercise per Session:   Stress:   . Feeling of Stress :   Social Connections:   . Frequency of Communication with Friends and Family:   . Frequency of Social Gatherings with Friends and Family:   . Attends Religious Services:   . Active Member of Clubs or Organizations:   . Attends Archivist Meetings:   Marland Kitchen Marital Status:   Intimate Partner Violence:   . Fear of Current or Ex-Partner:   . Emotionally Abused:   Marland Kitchen Physically Abused:   . Sexually Abused:       Tobacco Use: Medium Risk  . Smoking Tobacco Use: Former Smoker  . Smokeless Tobacco Use: Never Used   Social History   Substance and Sexual Activity  Alcohol Use Yes  . Alcohol/week: 1.0 standard drinks  . Types: 1 Glasses of wine per week   Comment: seldom     Family History  Problem Relation Age of Onset  . Irritable bowel syndrome Mother   . Other Mother        bladder infections, gallstones, low thyroid  . Heart disease Mother        3 vessel stints  . Hyperthyroidism Mother   . Emphysema Father   . Heart disease Father        congestive heart failure, stints place  . Other Sister        gallstones  . Other Brother        reflux, mitral valve prolapse  . Other Paternal Grandmother        TB  . Hyperthyroidism Sister   . Hyperparathyroidism Maternal Uncle     Review of Systems  Constitutional: Negative for chills and fever.  HENT: Negative for congestion, sore throat and tinnitus.   Eyes: Negative for double vision, photophobia and pain.  Respiratory: Negative for cough, shortness of breath and wheezing.   Cardiovascular: Negative for chest pain,  palpitations and orthopnea.  Gastrointestinal: Negative for heartburn, nausea and vomiting.  Genitourinary: Negative for dysuria, frequency and urgency.  Musculoskeletal: Positive for joint pain.  Neurological: Negative for dizziness,  weakness and headaches.    Objective:  Physical Exam: Well nourished and well developed. General: Alert and oriented x3, cooperative and pleasant, no acute distress. Head: normocephalic, atraumatic, neck supple. Eyes: EOMI. Respiratory: breath sounds clear in all fields, no wheezing, rales, or rhonchi. Cardiovascular: Regular rate and rhythm, no murmurs, gallops or rubs.  Abdomen: non-tender to palpation and soft, normoactive bowel sounds.  Musculoskeletal: Right Knee Exam:  No effusion present. No swelling present. The range of motion is: 0 to 125 degrees.  No crepitus on range of motion of the knee.  Significant medial joint line tenderness. No lateral joint line tenderness.  The knee is stable.  Calves soft and nontender. Motor function intact in LE. Strength 5/5 LE bilaterally. Neuro: Distal pulses 2+. Sensation to light touch intact in LE.  Imaging Review Plain radiographs demonstrate severe degenerative joint disease of the right knee, isolated to the medial compartment. The overall alignment is neutral. The bone quality appears to be adequate for age and reported activity level.  Assessment/Plan:  End stage arthritis, right knee, medial compartment  The patient history, physical examination, clinical judgment of the provider and imaging studies are consistent with end stage degenerative joint disease of the right knee and unicompartmental knee arthroplasty is deemed medically necessary. The treatment options including medical management, injection therapy arthroscopy and arthroplasty were discussed at length. The risks and benefits of total knee arthroplasty were presented and reviewed. The risks due to aseptic loosening, infection, stiffness, patella tracking problems, thromboembolic complications and other imponderables were discussed. The patient acknowledged the explanation, agreed to proceed with the plan and consent was signed. Patient is being admitted for inpatient  treatment for surgery, pain control, PT, OT, prophylactic antibiotics, VTE prophylaxis, progressive ambulation and ADLs and discharge planning. The patient is planning to be discharged home.   Patient's anticipated LOS is less than 2 midnights, meeting these requirements: - Younger than 46 - Lives within 1 hour of care - Has a competent adult at home to recover with post-op recover - NO history of  - Chronic pain requiring opiods  - Diabetes  - Coronary Artery Disease  - Heart failure  - Heart attack  - Stroke  - DVT/VTE  - Cardiac arrhythmia  - Respiratory Failure/COPD  - Renal failure  - Anemia  - Advanced Liver disease  Therapy Plans: outpatient therapy at [facility] Disposition: Home with husband Planned DVT Prophylaxis: Xarelto 10mg  daily DME needed: none PCP: Dr. Deland Pretty, clearance received  TXA: topical  Allergies: NKDA Anesthesia Concerns: nausea/vomiting BMI: 36.9  Other: Prefers to stay overnight. Oxycodone is okay. Hx of spontaneous DVT in 2011. Took Xarelto with previous UKR, had blood in stool with this, has been evaluated with colonoscopy.   - Patient was instructed on what medications to stop prior to surgery. - Follow-up visit in 2 weeks with Dr. Wynelle Link - Begin physical therapy following surgery - Pre-operative lab work as pre-surgical testing - Prescriptions will be provided in hospital at time of discharge  Theresa Duty, PA-C Orthopedic Surgery [facility] [place]

## 2019-10-25 ENCOUNTER — Other Ambulatory Visit (HOSPITAL_COMMUNITY)
Admission: RE | Admit: 2019-10-25 | Discharge: 2019-10-25 | Disposition: A | Discharge status: Home / Self Care | Payer: Medicare Other | Source: Ambulatory Visit | Attending: Orthopedic Surgery | Admitting: Orthopedic Surgery

## 2019-10-25 DIAGNOSIS — Z20822 Contact with and (suspected) exposure to covid-19: Secondary | ICD-10-CM | POA: Diagnosis not present

## 2019-10-25 DIAGNOSIS — Z01812 Encounter for preprocedural laboratory examination: Secondary | ICD-10-CM | POA: Diagnosis not present

## 2019-10-25 LAB — SARS CORONAVIRUS 2 (TAT 6-24 HRS): SARS Coronavirus 2: NEGATIVE

## 2019-10-28 MED ORDER — TRANEXAMIC ACID 1000 MG/10ML IV SOLN
2000.0000 mg | Freq: Once | INTRAVENOUS | Status: DC
  Filled 2019-10-28: qty 20

## 2019-10-28 MED ORDER — BUPIVACAINE LIPOSOME 1.3 % IJ SUSP
20.0000 mL | Freq: Once | INTRAMUSCULAR | Status: DC
  Filled 2019-10-28: qty 20

## 2019-10-29 ENCOUNTER — Encounter (HOSPITAL_COMMUNITY): Payer: Self-pay | Admitting: Orthopedic Surgery

## 2019-10-29 ENCOUNTER — Telehealth (HOSPITAL_COMMUNITY): Payer: Self-pay | Admitting: *Deleted

## 2019-10-29 ENCOUNTER — Encounter (HOSPITAL_COMMUNITY)
Admission: RE | Disposition: A | Discharge status: Home / Self Care | Payer: Self-pay | Source: Home / Self Care | Attending: Orthopedic Surgery

## 2019-10-29 ENCOUNTER — Other Ambulatory Visit: Payer: Self-pay

## 2019-10-29 ENCOUNTER — Observation Stay (HOSPITAL_COMMUNITY)
Admission: RE | Admit: 2019-10-29 | Discharge: 2019-10-30 | Disposition: A | Discharge status: Home / Self Care | Payer: Medicare Other | Attending: Orthopedic Surgery | Admitting: Orthopedic Surgery

## 2019-10-29 ENCOUNTER — Ambulatory Visit (HOSPITAL_COMMUNITY): Payer: Medicare Other | Admitting: Physician Assistant

## 2019-10-29 ENCOUNTER — Ambulatory Visit (HOSPITAL_COMMUNITY): Payer: Medicare Other | Admitting: Certified Registered"

## 2019-10-29 DIAGNOSIS — Z86718 Personal history of other venous thrombosis and embolism: Secondary | ICD-10-CM | POA: Insufficient documentation

## 2019-10-29 DIAGNOSIS — M810 Age-related osteoporosis without current pathological fracture: Secondary | ICD-10-CM | POA: Diagnosis not present

## 2019-10-29 DIAGNOSIS — R0609 Other forms of dyspnea: Secondary | ICD-10-CM | POA: Diagnosis not present

## 2019-10-29 DIAGNOSIS — E785 Hyperlipidemia, unspecified: Secondary | ICD-10-CM | POA: Diagnosis not present

## 2019-10-29 DIAGNOSIS — K449 Diaphragmatic hernia without obstruction or gangrene: Secondary | ICD-10-CM | POA: Diagnosis not present

## 2019-10-29 DIAGNOSIS — G8918 Other acute postprocedural pain: Secondary | ICD-10-CM | POA: Diagnosis not present

## 2019-10-29 DIAGNOSIS — K219 Gastro-esophageal reflux disease without esophagitis: Secondary | ICD-10-CM | POA: Diagnosis not present

## 2019-10-29 DIAGNOSIS — Z96652 Presence of left artificial knee joint: Secondary | ICD-10-CM | POA: Diagnosis not present

## 2019-10-29 DIAGNOSIS — M1711 Unilateral primary osteoarthritis, right knee: Secondary | ICD-10-CM | POA: Diagnosis not present

## 2019-10-29 HISTORY — PX: PARTIAL KNEE ARTHROPLASTY: SHX2174

## 2019-10-29 LAB — TYPE AND SCREEN
ABO/RH(D): O POS
Antibody Screen: NEGATIVE

## 2019-10-29 SURGERY — ARTHROPLASTY, KNEE, UNICOMPARTMENTAL
Anesthesia: Spinal | Site: Knee | Laterality: Right

## 2019-10-29 MED ORDER — MIDAZOLAM HCL 2 MG/2ML IJ SOLN
1.0000 mg | INTRAMUSCULAR | Status: AC
  Administered 2019-10-29: 1 mg via INTRAVENOUS
  Filled 2019-10-29: qty 2

## 2019-10-29 MED ORDER — MIDAZOLAM HCL 2 MG/2ML IJ SOLN
INTRAMUSCULAR | Status: AC
  Filled 2019-10-29: qty 2

## 2019-10-29 MED ORDER — BUPIVACAINE IN DEXTROSE 0.75-8.25 % IT SOLN
INTRATHECAL | Status: DC | PRN
  Administered 2019-10-29: 1.6 mL via INTRATHECAL

## 2019-10-29 MED ORDER — DEXAMETHASONE SODIUM PHOSPHATE 10 MG/ML IJ SOLN
10.0000 mg | Freq: Once | INTRAMUSCULAR | Status: AC
  Administered 2019-10-30: 10 mg via INTRAVENOUS
  Filled 2019-10-29: qty 1

## 2019-10-29 MED ORDER — POVIDONE-IODINE 10 % EX SWAB
2.0000 "application " | Freq: Once | CUTANEOUS | Status: AC
  Administered 2019-10-29: 2 via TOPICAL

## 2019-10-29 MED ORDER — LACTATED RINGERS IV SOLN: INTRAVENOUS | Status: DC

## 2019-10-29 MED ORDER — PROPOFOL 10 MG/ML IV BOLUS
INTRAVENOUS | Status: DC | PRN
  Administered 2019-10-29: 20 mg via INTRAVENOUS

## 2019-10-29 MED ORDER — BISACODYL 10 MG RE SUPP: 10.0000 mg | Freq: Every day | RECTAL | Status: DC | PRN

## 2019-10-29 MED ORDER — METHOCARBAMOL 500 MG IVPB - SIMPLE MED
INTRAVENOUS | Status: AC
  Filled 2019-10-29: qty 50

## 2019-10-29 MED ORDER — SODIUM CHLORIDE 0.9 % IV SOLN
INTRAVENOUS | Status: DC
  Administered 2019-10-29: 1000 mL via INTRAVENOUS

## 2019-10-29 MED ORDER — METHOCARBAMOL 500 MG PO TABS
500.0000 mg | ORAL_TABLET | Freq: Four times a day (QID) | ORAL | Status: DC | PRN
  Administered 2019-10-30: 500 mg via ORAL
  Filled 2019-10-29: qty 1

## 2019-10-29 MED ORDER — ONDANSETRON HCL 4 MG PO TABS: 4.0000 mg | ORAL_TABLET | Freq: Four times a day (QID) | ORAL | Status: DC | PRN

## 2019-10-29 MED ORDER — METHOCARBAMOL 500 MG IVPB - SIMPLE MED
500.0000 mg | Freq: Four times a day (QID) | INTRAVENOUS | Status: DC | PRN
  Administered 2019-10-29: 500 mg via INTRAVENOUS
  Filled 2019-10-29: qty 50

## 2019-10-29 MED ORDER — PROPOFOL 500 MG/50ML IV EMUL
INTRAVENOUS | Status: DC | PRN
  Administered 2019-10-29: 50 ug/kg/min via INTRAVENOUS

## 2019-10-29 MED ORDER — LIDOCAINE 2% (20 MG/ML) 5 ML SYRINGE
INTRAMUSCULAR | Status: DC | PRN
  Administered 2019-10-29: 40 mg via INTRAVENOUS

## 2019-10-29 MED ORDER — ONDANSETRON HCL 4 MG/2ML IJ SOLN
INTRAMUSCULAR | Status: DC | PRN
  Administered 2019-10-29: 4 mg via INTRAVENOUS

## 2019-10-29 MED ORDER — CEFAZOLIN SODIUM-DEXTROSE 2-4 GM/100ML-% IV SOLN
2.0000 g | Freq: Four times a day (QID) | INTRAVENOUS | Status: AC
  Administered 2019-10-29 (×2): 2 g via INTRAVENOUS
  Filled 2019-10-29 (×2): qty 100

## 2019-10-29 MED ORDER — CELECOXIB 200 MG PO CAPS
200.0000 mg | ORAL_CAPSULE | Freq: Once | ORAL | Status: AC
  Administered 2019-10-29: 200 mg via ORAL
  Filled 2019-10-29: qty 1

## 2019-10-29 MED ORDER — BUPIVACAINE LIPOSOME 1.3 % IJ SUSP: INTRAMUSCULAR | Status: DC | PRN

## 2019-10-29 MED ORDER — SODIUM CHLORIDE 0.9 % IR SOLN
Status: DC | PRN
  Administered 2019-10-29: 1000 mL

## 2019-10-29 MED ORDER — DOCUSATE SODIUM 100 MG PO CAPS
100.0000 mg | ORAL_CAPSULE | Freq: Two times a day (BID) | ORAL | Status: DC
  Administered 2019-10-29 – 2019-10-30 (×2): 100 mg via ORAL
  Filled 2019-10-29 (×2): qty 1

## 2019-10-29 MED ORDER — SODIUM CHLORIDE (PF) 0.9 % IJ SOLN
INTRAMUSCULAR | Status: AC
  Filled 2019-10-29: qty 50

## 2019-10-29 MED ORDER — OXYCODONE HCL 5 MG PO TABS
5.0000 mg | ORAL_TABLET | ORAL | Status: DC | PRN
  Administered 2019-10-29 – 2019-10-30 (×3): 5 mg via ORAL
  Filled 2019-10-29 (×3): qty 1

## 2019-10-29 MED ORDER — MIDAZOLAM HCL 2 MG/2ML IJ SOLN
INTRAMUSCULAR | Status: DC | PRN
  Administered 2019-10-29: .5 mg via INTRAVENOUS

## 2019-10-29 MED ORDER — PHENOL 1.4 % MT LIQD: 1.0000 | OROMUCOSAL | Status: DC | PRN

## 2019-10-29 MED ORDER — ONDANSETRON HCL 4 MG/2ML IJ SOLN: 4.0000 mg | Freq: Four times a day (QID) | INTRAMUSCULAR | Status: DC | PRN

## 2019-10-29 MED ORDER — PROPOFOL 10 MG/ML IV BOLUS
INTRAVENOUS | Status: AC
  Filled 2019-10-29: qty 20

## 2019-10-29 MED ORDER — FLEET ENEMA 7-19 GM/118ML RE ENEM: 1.0000 | ENEMA | Freq: Once | RECTAL | Status: DC | PRN

## 2019-10-29 MED ORDER — MORPHINE SULFATE (PF) 2 MG/ML IV SOLN: 0.5000 mg | INTRAVENOUS | Status: DC | PRN

## 2019-10-29 MED ORDER — ACETAMINOPHEN 10 MG/ML IV SOLN
1000.0000 mg | Freq: Four times a day (QID) | INTRAVENOUS | Status: DC
  Administered 2019-10-29: 1000 mg via INTRAVENOUS
  Filled 2019-10-29: qty 100

## 2019-10-29 MED ORDER — STERILE WATER FOR IRRIGATION IR SOLN
Status: DC | PRN
  Administered 2019-10-29 (×2): 1000 mL

## 2019-10-29 MED ORDER — ROPIVACAINE HCL 7.5 MG/ML IJ SOLN
INTRAMUSCULAR | Status: DC | PRN
  Administered 2019-10-29: 20 mL via PERINEURAL

## 2019-10-29 MED ORDER — DEXAMETHASONE SODIUM PHOSPHATE 10 MG/ML IJ SOLN
8.0000 mg | Freq: Once | INTRAMUSCULAR | Status: AC
  Administered 2019-10-29: 8 mg via INTRAVENOUS

## 2019-10-29 MED ORDER — RIVAROXABAN 10 MG PO TABS
10.0000 mg | ORAL_TABLET | Freq: Every day | ORAL | Status: DC
  Administered 2019-10-30: 10 mg via ORAL
  Filled 2019-10-29: qty 1

## 2019-10-29 MED ORDER — SODIUM CHLORIDE 0.9 % IV SOLN
INTRAVENOUS | Status: DC | PRN
  Administered 2019-10-29: 80 mL

## 2019-10-29 MED ORDER — MENTHOL 3 MG MT LOZG: 1.0000 | LOZENGE | OROMUCOSAL | Status: DC | PRN

## 2019-10-29 MED ORDER — 0.9 % SODIUM CHLORIDE (POUR BTL) OPTIME
TOPICAL | Status: DC | PRN
  Administered 2019-10-29: 1000 mL

## 2019-10-29 MED ORDER — FENTANYL CITRATE (PF) 100 MCG/2ML IJ SOLN
50.0000 ug | INTRAMUSCULAR | Status: AC
  Administered 2019-10-29: 100 ug via INTRAVENOUS
  Filled 2019-10-29: qty 2

## 2019-10-29 MED ORDER — PROPOFOL 1000 MG/100ML IV EMUL
INTRAVENOUS | Status: AC
  Filled 2019-10-29: qty 100

## 2019-10-29 MED ORDER — METOCLOPRAMIDE HCL 5 MG PO TABS: 5.0000 mg | ORAL_TABLET | Freq: Three times a day (TID) | ORAL | Status: DC | PRN

## 2019-10-29 MED ORDER — ONDANSETRON HCL 4 MG/2ML IJ SOLN
INTRAMUSCULAR | Status: AC
  Filled 2019-10-29: qty 2

## 2019-10-29 MED ORDER — TRANEXAMIC ACID-NACL 1000-0.7 MG/100ML-% IV SOLN
INTRAVENOUS | Status: AC | PRN
  Administered 2019-10-29: 2000 mg via INTRAVENOUS

## 2019-10-29 MED ORDER — FENTANYL CITRATE (PF) 100 MCG/2ML IJ SOLN
25.0000 ug | INTRAMUSCULAR | Status: DC | PRN
  Administered 2019-10-29: 25 ug via INTRAVENOUS

## 2019-10-29 MED ORDER — FENTANYL CITRATE (PF) 100 MCG/2ML IJ SOLN
INTRAMUSCULAR | Status: AC
  Filled 2019-10-29: qty 2

## 2019-10-29 MED ORDER — ACETAMINOPHEN 500 MG PO TABS
1000.0000 mg | ORAL_TABLET | Freq: Four times a day (QID) | ORAL | Status: DC
  Administered 2019-10-29 – 2019-10-30 (×3): 1000 mg via ORAL
  Filled 2019-10-29 (×3): qty 2

## 2019-10-29 MED ORDER — SODIUM CHLORIDE (PF) 0.9 % IJ SOLN
INTRAMUSCULAR | Status: AC
  Filled 2019-10-29: qty 10

## 2019-10-29 MED ORDER — DIPHENHYDRAMINE HCL 12.5 MG/5ML PO ELIX: 12.5000 mg | ORAL_SOLUTION | ORAL | Status: DC | PRN

## 2019-10-29 MED ORDER — CEFAZOLIN SODIUM-DEXTROSE 2-4 GM/100ML-% IV SOLN
2.0000 g | INTRAVENOUS | Status: AC
  Administered 2019-10-29: 2 g via INTRAVENOUS
  Filled 2019-10-29: qty 100

## 2019-10-29 MED ORDER — LIDOCAINE 2% (20 MG/ML) 5 ML SYRINGE
INTRAMUSCULAR | Status: AC
  Filled 2019-10-29: qty 5

## 2019-10-29 MED ORDER — PROMETHAZINE HCL 25 MG/ML IJ SOLN: 6.2500 mg | INTRAMUSCULAR | Status: DC | PRN

## 2019-10-29 MED ORDER — TRAMADOL HCL 50 MG PO TABS: 50.0000 mg | ORAL_TABLET | Freq: Four times a day (QID) | ORAL | Status: DC | PRN

## 2019-10-29 MED ORDER — METOCLOPRAMIDE HCL 5 MG/ML IJ SOLN: 5.0000 mg | Freq: Three times a day (TID) | INTRAMUSCULAR | Status: DC | PRN

## 2019-10-29 MED ORDER — POLYETHYLENE GLYCOL 3350 17 G PO PACK: 17.0000 g | PACK | Freq: Every day | ORAL | Status: DC | PRN

## 2019-10-29 MED ORDER — DEXAMETHASONE SODIUM PHOSPHATE 10 MG/ML IJ SOLN
INTRAMUSCULAR | Status: AC
  Filled 2019-10-29: qty 1

## 2019-10-29 MED ORDER — FENTANYL CITRATE (PF) 100 MCG/2ML IJ SOLN
INTRAMUSCULAR | Status: AC
  Administered 2019-10-29: 25 ug via INTRAVENOUS
  Filled 2019-10-29: qty 2

## 2019-10-29 SURGICAL SUPPLY — 56 items
ARTISURF POLY PERSONA SZD 9 (Joint) ×3 IMPLANT
BAG DECANTER FOR FLEXI CONT (MISCELLANEOUS) IMPLANT
BAG ZIPLOCK 12X15 (MISCELLANEOUS) IMPLANT
BLADE SAW RECIPROCATING 77.5 (BLADE) ×3 IMPLANT
BLADE SAW SGTL 11.0X1.19X90.0M (BLADE) ×3 IMPLANT
BNDG ELASTIC 6X5.8 VLCR STR LF (GAUZE/BANDAGES/DRESSINGS) ×3 IMPLANT
BOWL SMART MIX CTS (DISPOSABLE) ×3 IMPLANT
CEMENT BONE R 1X40 (Cement) ×3 IMPLANT
CLOSURE WOUND 1/2 X4 (GAUZE/BANDAGES/DRESSINGS) ×1
COVER SURGICAL LIGHT HANDLE (MISCELLANEOUS) ×3 IMPLANT
COVER WAND RF STERILE (DRAPES) IMPLANT
CUFF TOURN SGL QUICK 34 (TOURNIQUET CUFF) ×2
CUFF TRNQT CYL 34X4.125X (TOURNIQUET CUFF) ×1 IMPLANT
DECANTER SPIKE VIAL GLASS SM (MISCELLANEOUS) IMPLANT
DRAPE U-SHAPE 47X51 STRL (DRAPES) ×3 IMPLANT
DRSG ADAPTIC 3X8 NADH LF (GAUZE/BANDAGES/DRESSINGS) IMPLANT
DRSG AQUACEL AG ADV 3.5X10 (GAUZE/BANDAGES/DRESSINGS) ×3 IMPLANT
DRSG PAD ABDOMINAL 8X10 ST (GAUZE/BANDAGES/DRESSINGS) IMPLANT
DURAPREP 26ML APPLICATOR (WOUND CARE) ×3 IMPLANT
ELECT REM PT RETURN 15FT ADLT (MISCELLANEOUS) ×3 IMPLANT
FEMUR PARTIAL CEM SZ2 RT MD KN (Knees) ×1 IMPLANT
GAUZE SPONGE 4X4 12PLY STRL (GAUZE/BANDAGES/DRESSINGS) IMPLANT
GLOVE BIO SURGEON STRL SZ7 (GLOVE) ×3 IMPLANT
GLOVE BIO SURGEON STRL SZ8 (GLOVE) ×3 IMPLANT
GLOVE BIOGEL PI IND STRL 7.0 (GLOVE) ×1 IMPLANT
GLOVE BIOGEL PI IND STRL 8 (GLOVE) ×2 IMPLANT
GLOVE BIOGEL PI INDICATOR 7.0 (GLOVE) ×2
GLOVE BIOGEL PI INDICATOR 8 (GLOVE) ×4
GOWN STRL REUS W/TWL LRG LVL3 (GOWN DISPOSABLE) ×9 IMPLANT
HANDPIECE INTERPULSE COAX TIP (DISPOSABLE) ×2
IMMOBILIZER KNEE 20 (SOFTGOODS) ×3
IMMOBILIZER KNEE 20 THIGH 36 (SOFTGOODS) ×1 IMPLANT
INSERTER TIP PARTIAL KNEE (MISCELLANEOUS) ×3 IMPLANT
KIT TURNOVER KIT A (KITS) IMPLANT
MANIFOLD NEPTUNE II (INSTRUMENTS) ×3 IMPLANT
NDL SAFETY ECLIPSE 18X1.5 (NEEDLE) IMPLANT
NEEDLE HYPO 18GX1.5 SHARP (NEEDLE)
NS IRRIG 1000ML POUR BTL (IV SOLUTION) ×3 IMPLANT
PACK TOTAL KNEE CUSTOM (KITS) ×3 IMPLANT
PADDING CAST COTTON 6X4 STRL (CAST SUPPLIES) ×6 IMPLANT
PART FEMUR CEM SZ2 RT MED KNEE (Knees) ×3 IMPLANT
PART TIBIAL CEM SZ D RT KNEE (Orthopedic Implant) ×3 IMPLANT
PENCIL SMOKE EVACUATOR (MISCELLANEOUS) IMPLANT
PROTECTOR NERVE ULNAR (MISCELLANEOUS) ×3 IMPLANT
SET HNDPC FAN SPRY TIP SCT (DISPOSABLE) ×1 IMPLANT
STRIP CLOSURE SKIN 1/2X4 (GAUZE/BANDAGES/DRESSINGS) ×2 IMPLANT
SUT MNCRL AB 4-0 PS2 18 (SUTURE) ×3 IMPLANT
SUT STRATAFIX 0 PDS 27 VIOLET (SUTURE) ×3
SUT VIC AB 2-0 CT1 27 (SUTURE) ×4
SUT VIC AB 2-0 CT1 TAPERPNT 27 (SUTURE) ×2 IMPLANT
SUTURE STRATFX 0 PDS 27 VIOLET (SUTURE) ×1 IMPLANT
SYR 50ML LL SCALE MARK (SYRINGE) ×3 IMPLANT
SYSTEM KNEE TIBIAL CEM SZ D RT (Orthopedic Implant) ×1 IMPLANT
WATER STERILE IRR 1000ML POUR (IV SOLUTION) ×3 IMPLANT
WRAP KNEE MAXI GEL POST OP (GAUZE/BANDAGES/DRESSINGS) ×3 IMPLANT
YANKAUER SUCT BULB TIP 10FT TU (MISCELLANEOUS) ×3 IMPLANT

## 2019-10-29 NOTE — Anesthesia Procedure Notes (Signed)
Anesthesia Regional Block: Adductor canal block   Pre-Anesthetic Checklist: ,, timeout performed, Correct Patient, Correct Site, Correct Laterality, Correct Procedure, Correct Position, site marked, Risks and benefits discussed,  Surgical consent,  Pre-op evaluation,  At surgeon's request and post-op pain management  Laterality: Right  Prep: chloraprep       Needles:  Injection technique: Single-shot  Needle Type: Stimulator Needle - 80     Needle Length: 10cm  Needle Gauge: 21     Additional Needles:   Narrative:  Start time: 10/29/2019 11:17 AM End time: 10/29/2019 11:27 AM Injection made incrementally with aspirations every 5 mL.  Performed by: Personally

## 2019-10-29 NOTE — Op Note (Signed)
OPERATIVE REPORT-UNICOMPARTMENTAL ARTHROPLASTY  PREOPERATIVE DIAGNOSIS: Medial compartment osteoarthritis, Right knee  POSTOPERATIVE DIAGNOSIS: Medial compartment osteoarthritis, Right knee  PROCEDURE: Right knee medial unicompartmental arthroplasty. ([facility])  SURGEON: Gaynelle Arabian, MD   ASSISTANT: Theresa Duty, PA-C  ANESTHESIA:  Adductor canal block and spinal.   ESTIMATED BLOOD LOSS: Minimal.   DRAINS: Hemovac x1.   TOURNIQUET TIME:  Total Tourniquet Time Documented: Thigh (Right) - 31 minutes Total: Thigh (Right) - 31 minutes   COMPLICATIONS: None.   CONDITION: Stable to recovery.   BRIEF CLINICAL NOTE:  Sherry Barnes is a 74 y.o. female , who has  significant isolated medial compartment arthritis of the Right knee. The patient has had nonoperative management including injections of cortisone and viscous supplements. Unfortunately, the pain persists.  Radiograph showed isolated medial compartment bone-on-bone arthritis  with normal-appearing patellofemoral and lateral compartments. The patient presents now for left knee unicompartmental arthroplasty.   PROCEDURE IN DETAIL: After successful administration of  Adductor canal block and spinal anesthetic, a tourniquet was placed high on theRight thigh and the Right lower extremity prepped and draped in usual sterile fashion. Extremity was wrapped in an Esmarch, knee flexed, and tourniquet inflated to 300 mmHg.       A midline incision was made with a 10 blade through subcutaneous tissue to the extensor mechanism. A fresh blade was used to make a  medial parapatellar arthrotomy. Soft tissue on the proximal medial  tibia subperiosteally elevated to the joint line with a knife and into  the semimembranosus bursa with a Cobb elevator. The patella was  subluxed laterally, and the knee flexed 90 degrees. The ACL was intact.  The marginal osteophytes on the medial femur and tibia were removed with  a rongeur. The  medial meniscus was also removed. The extramedullary tibial cutting guide was placed referencing Proximally at the medial aspect of the tibial tubercle and distally along the 2nd metatarsal axis and tibial crest. 6 degrees of posterior slope was dialed in and the block was pinned to remove 4 mm from the medial tibial surface.The cut is made with an oscillating saw and the cut bone removed.      The 9 mm spacer was then placed with the knee in extension with a stable fit. The distal femoral cutting guide was attached to the spacer in extension and pinned to make the distal femur cut with an oscillating saw. The trial size 2 is placed and is most appropriate. The femoral preparation is completed with the posterior and chamfer cuts and drilling of the 2 lug holes. The trial size 2 femur is placed with excellent fit. The 9 mm spacer is placed and there is excellent balance through full range of motion.  The trial and the spacer are removed and tibia addressed.      The tibial sizer is placed and size C is most appropriate. The proximal tibia is prepared with the drill holes and keel for the size C The size C implant is placed with excellent fit. The trials are removed and cut bone surfaces prepared with pulsatile lavage. The cement is mixed and once ready for implantation The size C tibia and size 2 femur are cemented into place and all extruded cement removed. The 9 mm insert is then placed into the tibial tray  and locked into position. The knee is placed through a full range of motion with excellent stability.           I then injected the extensor mechanism,  periosteum of  the femur and subcu tissues, a total of 20 mL of Exparel mixed with 30  mL of saline. Wound was copiously irrigated with saline solution, and the arthrotomy closed over a Hemovac drain with a running #0 Stratofix  suture. The subcutaneous was closed with  interrupted 2-0 Vicryl and subcuticular running 4-0 Monocryl. The drain  was  hooked to suction. Incision cleaned and dried and Steri-Strips and  a bulky sterile dressing applied. The tourniquet was released after a  total time of 30 minutes. This was done after closing the extensor  mechanism. The wound was closed and a bulky sterile dressing was  applied. The operative limb was placed into a knee immobilizer, and the patient awakened and transported to recovery room in stable condition.       Please note that a surgical assistant was a medical necessity for this  procedure in order to perform it in a safe and expeditious manner.  Assistance was necessary for retracting vital ligaments, neurovascular  structures, as well as for proper positioning of the limb to allow for  appropriate bone cuts and appropriate placement of the prosthesis.    Dione Plover Arial Galligan, MD

## 2019-10-29 NOTE — Interval H&P Note (Signed)
History and Physical Interval Note:  10/29/2019 10:30 AM  Sherry Barnes  has presented today for surgery, with the diagnosis of right knee medial unicompartment osteoarthritis.  The various methods of treatment have been discussed with the patient and family. After consideration of risks, benefits and other options for treatment, the patient has consented to  Procedure(s) with comments: Right knee medial unicompartmental arthroplasty (Right) - 33min as a surgical intervention.  The patient's history has been reviewed, patient examined, no change in status, stable for surgery.  I have reviewed the patient's chart and labs.  Questions were answered to the patient's satisfaction.     Pilar Plate Kipper Buch

## 2019-10-29 NOTE — Plan of Care (Signed)

## 2019-10-29 NOTE — Anesthesia Procedure Notes (Signed)
Procedure Name: MAC Date/Time: 10/29/2019 11:35 AM Performed by: Eben Burow, CRNA Pre-anesthesia Checklist: Patient identified, Emergency Drugs available, Suction available, Patient being monitored and Timeout performed Oxygen Delivery Method: Simple face mask Dental Injury: Teeth and Oropharynx as per pre-operative assessment

## 2019-10-29 NOTE — Transfer of Care (Signed)
Immediate Anesthesia Transfer of Care Note  Patient: Sherry Barnes  Procedure(s) Performed: RIGHT KNEE MEDIAL UNICOMPARTMENTAL ARTHROPLASTY (Right Knee)  Patient Location: PACU  Anesthesia Type:Spinal  Level of Consciousness: awake, alert  and oriented  Airway & Oxygen Therapy: Patient Spontanous Breathing and Patient connected to face mask oxygen  Post-op Assessment: Report given to RN and Post -op Vital signs reviewed and stable  Post vital signs: Reviewed and stable  Last Vitals:  Vitals Value Taken Time  BP 117/63 10/29/19 1305  Temp    Pulse 65 10/29/19 1306  Resp 13 10/29/19 1306  SpO2 100 % 10/29/19 1306  Vitals shown include unvalidated device data.  Last Pain:  Vitals:   10/29/19 1127  TempSrc:   PainSc: 0-No pain         Complications: No apparent anesthesia complications

## 2019-10-29 NOTE — Care Plan (Signed)
Ortho Bundle Case Management Note  Patient Details  Name: Sherry Barnes MRN: [id number] Date of Birth: 12-12-1945  R UKA on 10-29-19 DCP:  Home with husband.  1 story home with 0 ste. DME:  No needs.  Has a RW and 3-in-1. PT:  [facility].  PT eval scheduled on 10-31-19.                   DME Arranged:  N/A DME Agency:  NA  HH Arranged:  NA HH Agency:  NA  Additional Comments: Please contact me with any questions of if this plan should need to change.  Marianne Sofia, RN,CCM [facility]  [phone] 10/29/2019, 10:26 AM

## 2019-10-29 NOTE — Progress Notes (Signed)
Orthopedic Tech Progress Note Patient Details:  Sherry Barnes [birth year] [id number]  CPM Right Knee CPM Right Knee: On Right Knee Flexion (Degrees): 40 Right Knee Extension (Degrees): 10  Post Interventions Patient Tolerated: Well Instructions Provided: Care of device, Adjustment of device  Derica Leiber 10/29/2019, 3:26 PM

## 2019-10-29 NOTE — Evaluation (Signed)
Physical Therapy Evaluation Patient Details Name: Sherry Barnes MRN: [id number] DOB: 1945/08/05 Today's Date: 10/29/2019   History of Present Illness  74 yo female s/p R unicompartmental knee arthroplasty 5/12. L UKA 2018  Clinical Impression  On eval POD 0, pt required Min assist for mobility. She walked ~65 feet with a RW. Pain rated 4/10 with activity. Pt denied lightheadedness/dizziness. Plan is for d/c home on tomorrow as long as all continues to go well.     Follow Up Recommendations Follow surgeon's recommendation for DC plan and follow-up therapies    Equipment Recommendations  None recommended by PT    Recommendations for Other Services       Precautions / Restrictions Precautions Precautions: Fall Restrictions Weight Bearing Restrictions: No Other Position/Activity Restrictions: WBAT      Mobility  Bed Mobility Overal bed mobility: Needs Assistance Bed Mobility: Supine to Sit     Supine to sit: Min guard     General bed mobility comments: cues for technique. increased time.  Transfers Overall transfer level: Needs assistance Equipment used: Rolling walker (2 wheeled) Transfers: Sit to/from Stand Sit to Stand: Min assist         General transfer comment: assist to rise, stabilize. cues for safety, hand placement  Ambulation/Gait Ambulation/Gait assistance: Min assist Gait Distance (Feet): 65 Feet Assistive device: Rolling walker (2 wheeled) Gait Pattern/deviations: Step-to pattern     General Gait Details: intermittent assist to steady. cues for safety, sequence.  Stairs            Wheelchair Mobility    Modified Rankin (Stroke Patients Only)       Balance Overall balance assessment: Needs assistance         Standing balance support: Bilateral upper extremity supported Standing balance-Leahy Scale: Fair                               Pertinent Vitals/Pain Pain Assessment: 0-10 Pain Score: 4  Pain Location: R  knee Pain Descriptors / Indicators: Aching;Sore;Discomfort Pain Intervention(s): Monitored during session;Repositioned    Home Living Family/patient expects to be discharged to:: Private residence Living Arrangements: Spouse/significant other Available Help at Discharge: Family Type of Home: House Home Access: Level entry     Home Layout: One level Home Equipment: Environmental consultant - 2 wheels;Cane - single point      Prior Function Level of Independence: Independent               Hand Dominance        Extremity/Trunk Assessment   Upper Extremity Assessment Upper Extremity Assessment: Overall WFL for tasks assessed    Lower Extremity Assessment Lower Extremity Assessment: Generalized weakness    Cervical / Trunk Assessment Cervical / Trunk Assessment: Normal  Communication   Communication: No difficulties  Cognition Arousal/Alertness: Awake/alert Behavior During Therapy: WFL for tasks assessed/performed Overall Cognitive Status: Within Functional Limits for tasks assessed                                        General Comments      Exercises     Assessment/Plan    PT Assessment Patient needs continued PT services  PT Problem List Decreased strength;Decreased activity tolerance;Decreased range of motion;Decreased balance;Decreased mobility;Pain       PT Treatment Interventions DME instruction;Gait training;Stair training;Functional mobility training;Therapeutic activities;Therapeutic exercise;Balance training;Patient/family education  PT Goals (Current goals can be found in the Care Plan section)  Acute Rehab PT Goals Patient Stated Goal: none stated PT Goal Formulation: With patient Time For Goal Achievement: 11/12/19 Potential to Achieve Goals: Good    Frequency 7X/week   Barriers to discharge        Co-evaluation               AM-PAC PT "6 Clicks" Mobility  Outcome Measure Help needed turning from your back to your side  while in a flat bed without using bedrails?: A Little Help needed moving from lying on your back to sitting on the side of a flat bed without using bedrails?: A Little Help needed moving to and from a bed to a chair (including a wheelchair)?: A Little Help needed standing up from a chair using your arms (e.g., wheelchair or bedside chair)?: A Little Help needed to walk in hospital room?: A Little Help needed climbing 3-5 steps with a railing? : A Little 6 Click Score: 18    End of Session Equipment Utilized During Treatment: Gait belt Activity Tolerance: Patient tolerated treatment well Patient left: in bed;with call bell/phone within reach;with bed alarm set;with family/visitor present   PT Visit Diagnosis: Other abnormalities of gait and mobility (R26.89)    Time: [id number] PT Time Calculation (min) (ACUTE ONLY): 28 min   Charges:   PT Evaluation $PT Eval Low Complexity: 1 Low PT Treatments $Gait Training: 8-22 mins           Doreatha Massed, PT Acute Rehabilitation

## 2019-10-29 NOTE — Anesthesia Postprocedure Evaluation (Signed)
Anesthesia Post Note  Patient: Sherry Barnes  Procedure(s) Performed: RIGHT KNEE MEDIAL UNICOMPARTMENTAL ARTHROPLASTY (Right Knee)     Patient location during evaluation: PACU Anesthesia Type: Spinal Level of consciousness: awake and alert Pain management: pain level controlled Vital Signs Assessment: post-procedure vital signs reviewed and stable Respiratory status: spontaneous breathing and respiratory function stable Cardiovascular status: blood pressure returned to baseline and stable Postop Assessment: spinal receding Anesthetic complications: no    Last Vitals:  Vitals:   10/29/19 1445 10/29/19 1535  BP: 133/74 128/66  Pulse: 65 72  Resp: 19 16  Temp: 36.6 C 36.6 C  SpO2: 98% 97%    Last Pain:  Vitals:   10/29/19 1535  TempSrc: Oral  PainSc:                  [name]

## 2019-10-29 NOTE — Anesthesia Preprocedure Evaluation (Addendum)
Anesthesia Evaluation  Patient identified by MRN, date of birth, ID band Patient awake    Reviewed: Allergy & Precautions, NPO status , Patient's Chart, lab work & pertinent test results  History of Anesthesia Complications (+) PONV and history of anesthetic complications  Airway Mallampati: II  TM Distance: >3 FB Neck ROM: Full    Dental no notable dental hx. (+) Dental Advisory Given   Pulmonary former smoker,    Pulmonary exam normal        Cardiovascular + DVT  Normal cardiovascular exam     Neuro/Psych negative neurological ROS  negative psych ROS   GI/Hepatic Neg liver ROS, hiatal hernia, GERD  ,  Endo/Other  negative endocrine ROS  Renal/GU negative Renal ROS  negative genitourinary   Musculoskeletal negative musculoskeletal ROS (+)   Abdominal   Peds negative pediatric ROS (+)  Hematology negative hematology ROS (+)   Anesthesia Other Findings   Reproductive/Obstetrics negative OB ROS                           Anesthesia Physical  Anesthesia Plan  ASA: II  Anesthesia Plan: Spinal   Post-op Pain Management:  Regional for Post-op pain   Induction:   PONV Risk Score and Plan: 3 and Ondansetron, Dexamethasone and Propofol infusion  Airway Management Planned: Simple Face Mask  Additional Equipment:   Intra-op Plan:   Post-operative Plan:   Informed Consent: I have reviewed the patients History and Physical, chart, labs and discussed the procedure including the risks, benefits and alternatives for the proposed anesthesia with the patient or authorized representative who has indicated his/her understanding and acceptance.     Dental advisory given  Plan Discussed with: CRNA and Anesthesiologist  Anesthesia Plan Comments:        Anesthesia Quick Evaluation

## 2019-10-29 NOTE — Discharge Instructions (Signed)
Sherry Arabian, MD Total [facility] Specialist [facility] [address], [address] [place], [place] [address] [phone]  UNICOMPARTMENTAL KNEE REPLACEMENT POSTOPERATIVE DIRECTIONS  Knee Rehabilitation, Guidelines Following Surgery  Results after knee surgery are often greatly improved when you follow the exercise, range of motion and muscle strengthening exercises prescribed by your doctor. Safety measures are also important to protect the knee from further injury. If any of these exercises cause you to have increased pain or swelling in your knee joint, decrease the amount until you are comfortable again and slowly increase them. If you have problems or questions, call your caregiver or physical therapist for advice.   BLOOD CLOT PREVENTION . Take a 10 mg Xarelto once a day for three weeks following surgery. Then take an 81 mg Aspirin once a day for three weeks. Then discontinue Aspirin. . You may resume your vitamins/supplements once you have discontinued the Xarelto. . Do not take any NSAIDs (Advil, Aleve, Ibuprofen, Meloxicam, etc.) until you have discontinued the Xarelto.   HOME CARE INSTRUCTIONS  . Remove items at home which could result in a fall. This includes throw rugs or furniture in walking pathways.  . ICE to the affected knee as much as tolerated. Icing helps control swelling. If the swelling is well controlled you will be more comfortable and rehab easier. Continue to use ice on the knee for pain and swelling from surgery. You may notice swelling that will progress down to the foot and ankle. This is normal after surgery. Elevate the leg when you are not up walking on it.    . Continue to use the breathing machine which will help keep your temperature down. It is common for your temperature to cycle up and down following surgery, especially at night when you are not up moving around and exerting yourself. The breathing machine keeps your lungs expanded and your  temperature down. . Do not place pillow under the operative knee, focus on keeping the knee straight while resting  DIET You may resume your previous home diet once you are discharged from the hospital.  DRESSING / [place] / SHOWERING . Keep your bulky bandage on for 2 days. On the third post-operative day you may remove the Ace bandage and gauze. There is a waterproof adhesive bandage on your skin which will stay in place until your first follow-up appointment. Once you remove this you will not need to place another bandage . You may begin showering 3 days following surgery, but do not submerge the incision under water.  ACTIVITY For the first 5 days, the key is rest and control of pain and swelling . Do your home exercises twice a day starting on post-operative day 3. On the days you go to physical therapy, just do the home exercises once that day. . You should rest, ice and elevate the leg for 50 minutes out of every hour. Get up and walk/stretch for 10 minutes per hour. After 5 days you can increase your activity slowly as tolerated. . Walk with your walker as instructed. Use the walker until you are comfortable transitioning to a cane. Walk with the cane in the opposite hand of the operative leg. You may discontinue the cane once you are comfortable and walking steadily. . Avoid periods of inactivity such as sitting longer than an hour when not asleep. This helps prevent blood clots.  . You may discontinue the knee immobilizer once you are able to perform a straight leg raise while  lying down. . You may resume a sexual relationship in one month or when given the OK by your doctor.  . You may return to work once you are cleared by your doctor.  . Do not drive a car for 6 weeks or until released by your surgeon.  . Do not drive while taking narcotics.  TED HOSE STOCKINGS Wear the elastic stockings on both legs for three weeks following surgery during the day. You may remove them at night  for sleeping.  WEIGHT BEARING Weight bearing as tolerated with assist device (walker, cane, etc) as directed, use it as long as suggested by your surgeon or therapist, typically at least 4-6 weeks.  POSTOPERATIVE CONSTIPATION PROTOCOL Constipation - defined medically as fewer than three stools per week and severe constipation as less than one stool per week.  One of the most common issues patients have following surgery is constipation.  Even if you have a regular bowel pattern at home, your normal regimen is likely to be disrupted due to multiple reasons following surgery.  Combination of anesthesia, postoperative narcotics, change in appetite and fluid intake all can affect your bowels.  In order to avoid complications following surgery, here are some recommendations in order to help you during your recovery period.  . Colace (docusate) - Pick up an over-the-counter form of Colace or another stool softener and take twice a day as long as you are requiring postoperative pain medications.  Take with a full glass of water daily.  If you experience loose stools or diarrhea, hold the colace until you stool forms back up. If your symptoms do not get better within 1 week or if they get worse, check with your doctor. . Dulcolax (bisacodyl) - Pick up over-the-counter and take as directed by the product packaging as needed to assist with the movement of your bowels.  Take with a full glass of water.  Use this product as needed if not relieved by Colace only.  . MiraLax (polyethylene glycol) - Pick up over-the-counter to have on hand. MiraLax is a solution that will increase the amount of water in your bowels to assist with bowel movements.  Take as directed and can mix with a glass of water, juice, soda, coffee, or tea. Take if you go more than two days without a movement. Do not use MiraLax more than once per day. Call your doctor if you are still constipated or irregular after using this medication for 7 days  in a row.  If you continue to have problems with postoperative constipation, please contact the office for further assistance and recommendations.  If you experience "the worst abdominal pain ever" or develop nausea or vomiting, please contact the office immediatly for further recommendations for treatment.  ITCHING If you experience itching with your medications, try taking only a single pain pill, or even half a pain pill at a time.  You can also use Benadryl over the counter for itching or also to help with sleep.   MEDICATIONS See your medication summary on the "After Visit Summary" that the nursing staff will review with you prior to discharge.  You may have some home medications which will be placed on hold until you complete the course of blood thinner medication.  It is important for you to complete the blood thinner medication as prescribed by your surgeon.  Continue your approved medications as instructed at time of discharge.  PRECAUTIONS . If you experience chest pain or shortness of breath - call  call 911 immediately for transfer to the hospital emergency department.  . If you develop a fever greater that 101 F, purulent drainage from wound, increased redness or drainage from wound, foul odor from the wound/dressing, or calf pain - CONTACT YOUR SURGEON.                                                   FOLLOW-UP APPOINTMENTS Make sure you keep all of your appointments after your operation with your surgeon and caregivers. You should call the office at the above phone number and make an appointment for approximately two weeks after the date of your surgery or on the date instructed by your surgeon outlined in the "After Visit Summary".  RANGE OF MOTION AND STRENGTHENING EXERCISES  Rehabilitation of the knee is important following a knee injury or an operation. After just a few days of immobilization, the muscles of the thigh which control the knee become weakened and shrink (atrophy). Knee  exercises are designed to build up the tone and strength of the thigh muscles and to improve knee motion. Often times heat used for twenty to thirty minutes before working out will loosen up your tissues and help with improving the range of motion but do not use heat for the first two weeks following surgery. These exercises can be done on a training (exercise) mat, on the floor, on a table or on a bed. Use what ever works the best and is most comfortable for you Knee exercises include:  . Leg Lifts - While your knee is still immobilized in a splint or cast, you can do straight leg raises. Lift the leg to 60 degrees, hold for 3 sec, and slowly lower the leg. Repeat 10-20 times 2-3 times daily. Perform this exercise against resistance later as your knee gets better.  . Quad and Hamstring Sets - Tighten up the muscle on the front of the thigh (Quad) and hold for 5-10 sec. Repeat this 10-20 times hourly. Hamstring sets are done by pushing the foot backward against an object and holding for 5-10 sec. Repeat as with quad sets.   Leg Slides: Lying on your back, slowly slide your foot toward your buttocks, bending your knee up off the floor (only go as far as is comfortable). Then slowly slide your foot back down until your leg is flat on the floor again.  Angel Wings: Lying on your back spread your legs to the side as far apart as you can without causing discomfort.  A rehabilitation program following serious knee injuries can speed recovery and prevent re-injury in the future due to weakened muscles. Contact your doctor or a physical therapist for more information on knee rehabilitation.   IF YOU ARE TRANSFERRED TO A SKILLED REHAB FACILITY If the patient is transferred to a skilled rehab facility following release from the hospital, a list of the current medications will be sent to the facility for the patient to continue.  When discharged from the skilled rehab facility, please have the facility set up the  patient's Home Health Physical Therapy prior to being released. Also, the skilled facility will be responsible for providing the patient with their medications at time of release from the facility to include their pain medication, the muscle relaxants, and their blood thinner medication. If the patient is still at the rehab facility   two week follow up appointment, the skilled rehab facility will also need to assist the patient in arranging follow up appointment in our office and any transportation needs.  MAKE SURE YOU:  . Understand these instructions.  . Get help right away if you are not doing well or get worse.   DENTAL ANTIBIOTICS:  In most cases prophylactic antibiotics for Dental procdeures after total joint surgery are not necessary.  Exceptions are as follows:  1. History of prior total joint infection  2. Severely immunocompromised (Organ Transplant, cancer chemotherapy, Rheumatoid biologic meds such as Humera)  3. Poorly controlled diabetes (A1C &gt; 8.0, blood glucose over 200)  If you have one of these conditions, contact your surgeon for an antibiotic prescription, prior to your dental procedure.    Pick up stool softner and laxative for home use following surgery while on pain medications. Do not submerge incision under water. Please use good hand washing techniques while changing dressing each day. May shower starting three days after surgery. Please use a clean towel to pat the incision dry following showers. Continue to use ice for pain and swelling after surgery. Do not use any lotions or creams on the incision until instructed by your surgeon.  

## 2019-10-29 NOTE — Progress Notes (Signed)
PT Cancellation Note  Patient Details Name: Sherry Barnes MRN: [id number] DOB: Apr 05, 1946   Cancelled Treatment:    Reason Eval/Treat Not Completed: Patient at procedure or test/unavailable. Pt is currently placed in CPM by ortho tech. Will attempt PT evaluation in the morning.   Lelon Mast 10/29/2019, 4:05 PM

## 2019-10-29 NOTE — Progress Notes (Signed)
Assisted Dr. Singer with right, ultrasound guided, adductor canal block. Side rails up, monitors on throughout procedure. See vital signs in flow sheet. Tolerated Procedure well.  

## 2019-10-29 NOTE — Anesthesia Procedure Notes (Signed)
Spinal  Patient location during procedure: OR Start time: 10/29/2019 11:28 AM End time: 10/29/2019 11:38 AM Staffing Performed: anesthesiologist  Anesthesiologist: Duane Boston, MD Preanesthetic Checklist Completed: patient identified, IV checked, risks and benefits discussed, surgical consent, monitors and equipment checked, pre-op evaluation and timeout performed Spinal Block Patient position: sitting Prep: DuraPrep Patient monitoring: cardiac monitor, continuous pulse ox and blood pressure Approach: midline Location: L2-3 Injection technique: single-shot Needle Needle type: Pencan  Needle gauge: 24 G Needle length: 9 cm Additional Notes Functioning IV was confirmed and monitors were applied. Sterile prep and drape, including hand hygiene and sterile gloves were used. The patient was positioned and the spine was prepped. The skin was anesthetized with lidocaine.  Free flow of clear CSF was obtained prior to injecting local anesthetic into the CSF.  The spinal needle aspirated freely following injection.  The needle was carefully withdrawn.  The patient tolerated the procedure well.

## 2019-10-30 ENCOUNTER — Encounter: Payer: Self-pay | Admitting: *Deleted

## 2019-10-30 DIAGNOSIS — R0609 Other forms of dyspnea: Secondary | ICD-10-CM | POA: Diagnosis not present

## 2019-10-30 DIAGNOSIS — M1711 Unilateral primary osteoarthritis, right knee: Secondary | ICD-10-CM | POA: Diagnosis not present

## 2019-10-30 DIAGNOSIS — E785 Hyperlipidemia, unspecified: Secondary | ICD-10-CM | POA: Diagnosis not present

## 2019-10-30 DIAGNOSIS — Z96652 Presence of left artificial knee joint: Secondary | ICD-10-CM | POA: Diagnosis not present

## 2019-10-30 DIAGNOSIS — M810 Age-related osteoporosis without current pathological fracture: Secondary | ICD-10-CM | POA: Diagnosis not present

## 2019-10-30 DIAGNOSIS — Z86718 Personal history of other venous thrombosis and embolism: Secondary | ICD-10-CM | POA: Diagnosis not present

## 2019-10-30 LAB — CBC
HCT: 33.5 % — ABNORMAL LOW (ref 36.0–46.0)
Hemoglobin: 11.1 g/dL — ABNORMAL LOW (ref 12.0–15.0)
MCH: 30.2 pg (ref 26.0–34.0)
MCHC: 33.1 g/dL (ref 30.0–36.0)
MCV: 91 fL (ref 80.0–100.0)
Platelets: 272 10*3/uL (ref 150–400)
RBC: 3.68 MIL/uL — ABNORMAL LOW (ref 3.87–5.11)
RDW: 12.9 % (ref 11.5–15.5)
WBC: 12.4 10*3/uL — ABNORMAL HIGH (ref 4.0–10.5)
nRBC: 0 % (ref 0.0–0.2)

## 2019-10-30 MED ORDER — RIVAROXABAN 10 MG PO TABS: 10.0000 mg | ORAL_TABLET | Freq: Every day | ORAL | 0 refills | Status: DC

## 2019-10-30 MED ORDER — TRAMADOL HCL 50 MG PO TABS: 50.0000 mg | ORAL_TABLET | Freq: Four times a day (QID) | ORAL | 0 refills | Status: DC | PRN

## 2019-10-30 MED ORDER — OXYCODONE HCL 5 MG PO TABS: 5.0000 mg | ORAL_TABLET | Freq: Four times a day (QID) | ORAL | 0 refills | Status: DC | PRN

## 2019-10-30 MED ORDER — METHOCARBAMOL 500 MG PO TABS: 500.0000 mg | ORAL_TABLET | Freq: Four times a day (QID) | ORAL | 0 refills | Status: DC | PRN

## 2019-10-30 NOTE — Progress Notes (Signed)
Physical Therapy Treatment Patient Details Name: Sherry Barnes MRN: [id number] DOB: 12/20/1945 Today's Date: 10/30/2019    History of Present Illness 74 yo female s/p R unicompartmental knee arthroplasty 5/12. L UKA 2018    PT Comments    Pt is progressing with mobility. She is ready to DC home from PT standpoint. She ambulated 31' with RW, distance limited by pain. Instructed pt/spouse in UKA HEP, they demonstrate good understanding. Pt is able to tolerate ~5 repetitions of each exercise, she is limited by pain. Encouraged pt to gradually increase repetitions to 10 as tolerated.    Follow Up Recommendations  Follow surgeon's recommendation for DC plan and follow-up therapies     Equipment Recommendations  None recommended by PT    Recommendations for Other Services       Precautions / Restrictions Precautions Precautions: Fall Restrictions Weight Bearing Restrictions: No Other Position/Activity Restrictions: WBAT    Mobility  Bed Mobility               General bed mobility comments: up in recliner  Transfers Overall transfer level: Needs assistance Equipment used: Rolling walker (2 wheeled) Transfers: Sit to/from Stand Sit to Stand: Min guard         General transfer comment: cues for  hand placement  Ambulation/Gait Ambulation/Gait assistance: Min guard Gait Distance (Feet): 60 Feet Assistive device: Rolling walker (2 wheeled) Gait Pattern/deviations: Step-to pattern Gait velocity: decr   General Gait Details: cues for safety, sequence. no loss of balance, distance limited by pain   Stairs             Wheelchair Mobility    Modified Rankin (Stroke Patients Only)       Balance Overall balance assessment: Needs assistance         Standing balance support: Bilateral upper extremity supported Standing balance-Leahy Scale: Fair                              Cognition Arousal/Alertness: Awake/alert Behavior During  Therapy: WFL for tasks assessed/performed Overall Cognitive Status: Within Functional Limits for tasks assessed                                        Exercises Total Joint Exercises Ankle Circles/Pumps: AROM;Both;10 reps;Supine Quad Sets: AROM;Both;5 reps;Supine Short Arc Quad: AROM;Right;5 reps;Supine Heel Slides: AAROM;Right;5 reps;Supine Hip ABduction/ADduction: AAROM;Right;5 reps;Supine Straight Leg Raises: AAROM;Right;5 reps;Supine Long Arc Quad: AROM;Right;5 reps;Seated Knee Flexion: AAROM;Right;5 reps;Seated Goniometric ROM: 0-40* AAROM R knee    General Comments        Pertinent Vitals/Pain Pain Score: 3  Pain Location: R knee Pain Descriptors / Indicators: Aching;Sore;Discomfort Pain Intervention(s): Limited activity within patient's tolerance;Monitored during session;Premedicated before session;Ice applied    Home Living                      Prior Function            PT Goals (current goals can now be found in the care plan section) Acute Rehab PT Goals Patient Stated Goal: yardwork, going to the gym PT Goal Formulation: With patient Time For Goal Achievement: 11/12/19 Potential to Achieve Goals: Good Progress towards PT goals: Progressing toward goals    Frequency    7X/week      PT Plan      Co-evaluation  AM-PAC PT "6 Clicks" Mobility   Outcome Measure  Help needed turning from your back to your side while in a flat bed without using bedrails?: A Little Help needed moving from lying on your back to sitting on the side of a flat bed without using bedrails?: A Little Help needed moving to and from a bed to a chair (including a wheelchair)?: A Little Help needed standing up from a chair using your arms (e.g., wheelchair or bedside chair)?: A Little Help needed to walk in hospital room?: A Little Help needed climbing 3-5 steps with a railing? : A Little 6 Click Score: 18    End of Session Equipment  Utilized During Treatment: Gait belt Activity Tolerance: Patient tolerated treatment well Patient left: with call bell/phone within reach;with family/visitor present;in chair Nurse Communication: Mobility status PT Visit Diagnosis: Other abnormalities of gait and mobility (R26.89);Pain Pain - Right/Left: Right Pain - part of body: Knee     Time: [id number] PT Time Calculation (min) (ACUTE ONLY): 35 min  Charges:  $Gait Training: 8-22 mins $Therapeutic Exercise: 8-22 mins                     Blondell Reveal Kistler PT 10/30/2019  Acute Rehabilitation Services Pager [phone] Office [phone]

## 2019-10-30 NOTE — Progress Notes (Signed)
Subjective: 1 Day Post-Op Procedure(s) (LRB): RIGHT KNEE MEDIAL UNICOMPARTMENTAL ARTHROPLASTY (Right) Patient reports pain as mild.   Patient seen in rounds for Dr. Wynelle Link. Patient is well, and has had no acute complaints or problems, other than not sleeping well last night. Pain well controlled with medications. Denies chest pain, SOB, or calf pain. Foley catheter to be removed this AM.  We will continue therapy today, ambulated 55' yesterday.   Objective: Vital signs in last 24 hours: Temp:  [97.4 F (36.3 C)-99.4 F (37.4 C)] 97.7 F (36.5 C) (05/13 0549) Pulse Rate:  [63-91] 81 (05/13 0549) Resp:  [10-19] 16 (05/13 0549) BP: (81-171)/(47-96) 148/74 (05/13 0549) SpO2:  [94 %-100 %] 100 % (05/13 0549) Weight:  [88.9 kg] 88.9 kg (05/12 1106)  Intake/Output from previous day:  Intake/Output Summary (Last 24 hours) at 10/30/2019 0802 Last data filed at 10/30/2019 0600 Gross per 24 hour  Intake 4331.78 ml  Output 4370 ml  Net -38.22 ml     Intake/Output this shift: No intake/output data recorded.  Labs: Recent Labs    10/30/19 0258  HGB 11.1*   Recent Labs    10/30/19 0258  WBC 12.4*  RBC 3.68*  HCT 33.5*  PLT 272   No results for input(s): NA, K, CL, CO2, BUN, CREATININE, GLUCOSE, CALCIUM in the last 72 hours. No results for input(s): LABPT, INR in the last 72 hours.  Exam: General - Patient is Alert and Oriented Extremity - Neurologically intact Neurovascular intact Sensation intact distally Dorsiflexion/Plantar flexion intact Dressing - dressing C/D/I Motor Function - intact, moving foot and toes well on exam.   Past Medical History:  Diagnosis Date  . Arthralgia of left elbow   . Dyspnea    at times with exertion   . Fainting spell    hx of 2014 - neck and nose fracture   . Fainting spell    hx oxf 2014   . GERD (gastroesophageal reflux disease)    at times  . Granulomatous lung disease ([place])    PULMOLOGIST--  DR Lamonte Sakai--  PFT'S NORMAL  .  H/O hernia repair    ventral  . History of cervical fracture    C5  laminar fx non-displace 02-21-2013 no surgical intervention  . History of DVT of lower extremity    behind left knee--  2011  resolved  . History of hiatal hernia    mild   . History of vulvar dysplasia   . Neck fracture (HCC)    hx of after fainting spell in 2014 per patient   . Osteoarthritis   . Osteopenia    right hip, right neck  . Osteoporosis    LEFT HIP AND NECK  . Osteoporosis    left neck and left hip  . PONV (postoperative nausea and vomiting)    weakness, trouble getting oxygenlevel up in PACU after 2016 surgery   . Right knee meniscal tear   . Vertigo    hx of in 2016   . Wears glasses     Assessment/Plan: 1 Day Post-Op Procedure(s) (LRB): RIGHT KNEE MEDIAL UNICOMPARTMENTAL ARTHROPLASTY (Right) Active Problems:   Primary osteoarthritis of right knee  Estimated body mass index is 35.85 kg/m as calculated from the following:   Height as of this encounter: 5\' 2"  (1.575 m).   Weight as of this encounter: 88.9 kg. Advance diet Up with therapy D/C IV fluids  DVT Prophylaxis - Xarelto Weight bearing as tolerated. Continue therapy.  Plan is to go  Home after hospital stay. Plan for discharge after one session of PT if meeting goals. Scheduled for OPPT at [facility] beginning tomorrow. Follow-up in the office on May 25.   Theresa Duty, PA-C Orthopedic Surgery [phone] 10/30/2019, 8:02 AM

## 2019-10-30 NOTE — TOC Transition Note (Signed)
Transition of Care [facility]) - CM/SW Discharge Note   Patient Details  Name: Sherry Barnes MRN: [id number] Date of Birth: Oct 18, 1945  Transition of Care [facility]) CM/SW Contact:  Lia Hopping, [place] Phone Number: 10/30/2019, 9:58 AM   Clinical Narrative:    Vanessa Barbara Plan of care. Patient report understanding.         Patient Goals and CMS Choice        Discharge Placement                       Discharge Plan and Services                DME Arranged: N/A DME Agency: NA       HH Arranged: NA HH Agency: NA        Social Determinants of Health (SDOH) Interventions     Readmission Risk Interventions No flowsheet data found.

## 2019-10-31 DIAGNOSIS — M25661 Stiffness of right knee, not elsewhere classified: Secondary | ICD-10-CM | POA: Insufficient documentation

## 2019-10-31 DIAGNOSIS — M25561 Pain in right knee: Secondary | ICD-10-CM | POA: Diagnosis not present

## 2019-11-05 NOTE — Discharge Summary (Signed)
Physician Discharge Summary   Patient ID: Sherry Barnes MRN: [id number] DOB/AGE: 1945/11/05 74 y.o.  Admit date: 10/29/2019 Discharge date: 10/30/2019  Primary Diagnosis: Medial compartment osteoarthritis, right knee   Admission Diagnoses:  Past Medical History:  Diagnosis Date  . Arthralgia of left elbow   . Dyspnea    at times with exertion   . Fainting spell    hx of 2014 - neck and nose fracture   . Fainting spell    hx oxf 2014   . GERD (gastroesophageal reflux disease)    at times  . Granulomatous lung disease ([place])    PULMOLOGIST--  DR Lamonte Sakai--  PFT'S NORMAL  . H/O hernia repair    ventral  . History of cervical fracture    C5  laminar fx non-displace 02-21-2013 no surgical intervention  . History of DVT of lower extremity    behind left knee--  2011  resolved  . History of hiatal hernia    mild   . History of vulvar dysplasia   . Neck fracture (HCC)    hx of after fainting spell in 2014 per patient   . Osteoarthritis   . Osteopenia    right hip, right neck  . Osteoporosis    LEFT HIP AND NECK  . Osteoporosis    left neck and left hip  . PONV (postoperative nausea and vomiting)    weakness, trouble getting oxygenlevel up in PACU after 2016 surgery   . Right knee meniscal tear   . Vertigo    hx of in 2016   . Wears glasses    Discharge Diagnoses:   Active Problems:   Primary osteoarthritis of right knee  Estimated body mass index is 35.85 kg/m as calculated from the following:   Height as of this encounter: 5\' 2"  (1.575 m).   Weight as of this encounter: 88.9 kg.  Procedure:  Procedure(s) (LRB): RIGHT KNEE MEDIAL UNICOMPARTMENTAL ARTHROPLASTY (Right)   Consults: None  HPI: Sherry Barnes is a 74 y.o. female , who has significant isolated medial compartment arthritis of the Right knee. The patient has had nonoperative management including injections of cortisone and viscous supplements. Unfortunately, the pain persists. Radiograph showed  isolated medial compartment bone-on-bone arthritis with normal-appearing patellofemoral and lateral compartments. The patient presents now for left knee unicompartmental arthroplasty.   Laboratory Data: Admission on 10/29/2019, Discharged on 10/30/2019  Component Date Value Ref Range Status  . WBC 10/30/2019 12.4* 4.0 - 10.5 K/uL Final  . RBC 10/30/2019 3.68* 3.87 - 5.11 MIL/uL Final  . Hemoglobin 10/30/2019 11.1* 12.0 - 15.0 g/dL Final  . HCT 10/30/2019 33.5* 36.0 - 46.0 % Final  . MCV 10/30/2019 91.0  80.0 - 100.0 fL Final  . MCH 10/30/2019 30.2  26.0 - 34.0 pg Final  . MCHC 10/30/2019 33.1  30.0 - 36.0 g/dL Final  . RDW 10/30/2019 12.9  11.5 - 15.5 % Final  . Platelets 10/30/2019 272  150 - 400 K/uL Final  . nRBC 10/30/2019 0.0  0.0 - 0.2 % Final   Performed at [facility], [place] [address] [place], [place] [address]  Hospital Outpatient Visit on 10/25/2019  Component Date Value Ref Range Status  . SARS Coronavirus 2 10/25/2019 NEGATIVE  NEGATIVE Final   Comment: (NOTE) SARS-CoV-2 target nucleic acids are NOT DETECTED. The SARS-CoV-2 RNA is generally detectable in upper and lower respiratory specimens during the acute phase of infection. Negative results do not preclude SARS-CoV-2 infection, do not rule out  co-infections with other pathogens, and should not be used as the sole basis for treatment or other patient management decisions. Negative results must be combined with clinical observations, patient history, and epidemiological information. The expected result is Negative. Fact Sheet for Patients: SugarRoll.be Fact Sheet for Healthcare Providers: https://www.woods-mathews.com/ This test is not yet approved or cleared by the [place] FDA and  has been authorized for detection and/or diagnosis of SARS-CoV-2 by FDA under an Emergency Use Authorization (EUA). This EUA will remain  in effect (meaning this test can  be used) for the duration of the COVID-19 declaration under Section 56                          4(b)(1) of the Act, 21 U.S.C. section 360bbb-3(b)(1), unless the authorization is terminated or revoked sooner. Performed at [facility], [place] [address] [place], [place] [address]   Hospital Outpatient Visit on 10/20/2019  Component Date Value Ref Range Status  . aPTT 10/20/2019 29  24 - 36 seconds Final   Performed at [facility], [place] [address] [place], [place] [address]  . WBC 10/20/2019 7.4  4.0 - 10.5 K/uL Final  . RBC 10/20/2019 4.33  3.87 - 5.11 MIL/uL Final  . Hemoglobin 10/20/2019 13.0  12.0 - 15.0 g/dL Final  . HCT 10/20/2019 39.8  36.0 - 46.0 % Final  . MCV 10/20/2019 91.9  80.0 - 100.0 fL Final  . MCH 10/20/2019 30.0  26.0 - 34.0 pg Final  . MCHC 10/20/2019 32.7  30.0 - 36.0 g/dL Final  . RDW 10/20/2019 13.0  11.5 - 15.5 % Final  . Platelets 10/20/2019 292  150 - 400 K/uL Final  . nRBC 10/20/2019 0.0  0.0 - 0.2 % Final   Performed at [facility], [place] [address], [place], [place] [address]  . Sodium 10/20/2019 141  135 - 145 mmol/L Final  . Potassium 10/20/2019 5.0  3.5 - 5.1 mmol/L Final  . Chloride 10/20/2019 107  98 - 111 mmol/L Final  . CO2 10/20/2019 28  22 - 32 mmol/L Final  . Glucose, Bld 10/20/2019 99  70 - 99 mg/dL Final   Glucose reference range applies only to samples taken after fasting for at least 8 hours.  . BUN 10/20/2019 10  8 - 23 mg/dL Final  . Creatinine, Ser 10/20/2019 0.57  0.44 - 1.00 mg/dL Final  . Calcium 10/20/2019 9.7  8.9 - 10.3 mg/dL Final  . Total Protein 10/20/2019 7.1  6.5 - 8.1 g/dL Final  . Albumin 10/20/2019 4.2  3.5 - 5.0 g/dL Final  . AST 10/20/2019 22  15 - 41 U/L Final  . ALT 10/20/2019 22  0 - 44 U/L Final  . Alkaline Phosphatase 10/20/2019 69  38 - 126 U/L Final  . Total Bilirubin 10/20/2019 0.6  0.3 - 1.2 mg/dL Final  . GFR calc non Af Amer 10/20/2019 >60  >60 mL/min Final  . GFR  calc Af Amer 10/20/2019 >60  >60 mL/min Final  . Anion gap 10/20/2019 6  5 - 15 Final   Performed at [facility] [place] [facility], [place] [address] [place], [place] [address]  . Prothrombin Time 10/20/2019 12.2  11.4 - 15.2 seconds Final  . INR 10/20/2019 0.9  0.8 - 1.2 Final   Comment: (NOTE) INR goal varies based on device and disease states. Performed at [facility], [place] [address], [place], [place] [address]   .  ABO/RH(D) 10/20/2019 O POS   Final  . Antibody Screen 10/20/2019 NEG   Final  . Sample Expiration 10/20/2019 11/01/2019,2359   Final  . Extend sample reason 10/20/2019    Final                   Value:NO TRANSFUSIONS OR PREGNANCY IN THE PAST 3 MONTHS Performed at [place] [address] [place], [place] [address]   . MRSA, PCR 10/20/2019 NEGATIVE  NEGATIVE Final  . Staphylococcus aureus 10/20/2019 NEGATIVE  NEGATIVE Final   Comment: (NOTE) The Xpert SA Assay (FDA approved for NASAL specimens in patients 58 years of age and older), is one component of a comprehensive surveillance program. It is not intended to diagnose infection nor to guide or monitor treatment. Performed at [facility], [place] [address] [place], [place] [address]      X-Rays:No results found.  EKG: Orders placed or performed during the hospital encounter of 05/24/18  . EKG 12-Lead  . EKG 12-Lead     Hospital Course: Sherry Barnes is a 74 y.o. who was admitted to [facility]. They were brought to the operating room on 10/29/2019 and underwent Procedure(s): RIGHT KNEE MEDIAL UNICOMPARTMENTAL ARTHROPLASTY.  Patient tolerated the procedure well and was later transferred to the recovery room and then to the orthopaedic floor for postoperative care. They were given PO and IV analgesics for pain control following their surgery. They were given 24 hours of postoperative antibiotics of  Anti-infectives (From admission, onward)    Start     Dose/Rate Route Frequency Ordered Stop   10/29/19 1800  ceFAZolin (ANCEF) IVPB 2g/100 mL premix     2 g 200 mL/hr over 30 Minutes Intravenous Every 6 hours 10/29/19 1619 10/29/19 2332   10/29/19 1030  ceFAZolin (ANCEF) IVPB 2g/100 mL premix     2 g 200 mL/hr over 30 Minutes Intravenous On call to O.R. 10/29/19 1029 10/29/19 1137     and started on DVT prophylaxis in the form of Xarelto.   PT and OT were ordered for total joint protocol. Discharge planning consulted to help with postop disposition and equipment needs.  Patient had a good night on the evening of surgery. They started to get up OOB with therapy on POD #0. Pt was seen during rounds and was ready to go home pending progress with therapy. Hemovac drain was pulled without difficulty. She worked with therapy on POD #1 and was meeting her goals. Pt was discharged to home later that day in stable condition.  Diet: Regular diet Activity: WBAT Follow-up: in 2 weeks Disposition: Home with outpatient physical therapy at [facility] Discharged Condition: stable   Discharge Instructions    Call MD / Call 911   Complete by: As directed    If you experience chest pain or shortness of breath, CALL 911 and be transported to the hospital emergency room.  If you develope a fever above 101 F, pus (white drainage) or increased drainage or redness at the wound, or calf pain, call your surgeon's office.   Change dressing   Complete by: As directed    You may remove the bulky bandage (ACE wrap and gauze) two days after surgery. You will have an adhesive waterproof bandage underneath. Leave this in place until your first follow-up appointment.   Constipation Prevention   Complete by: As directed    Drink plenty of fluids.  Prune juice may be helpful.  You may use a  stool softener, such as Colace (over the counter) 100 mg twice a day.  Use MiraLax (over the counter) for constipation as needed.   Diet - low sodium heart healthy   Complete by:  As directed    Do not put a pillow under the knee. Place it under the heel.   Complete by: As directed    Driving restrictions   Complete by: As directed    No driving for two weeks   TED hose   Complete by: As directed    Use stockings (TED hose) for three weeks on both leg(s).  You may remove them at night for sleeping.   Weight bearing as tolerated   Complete by: As directed      Allergies as of 10/30/2019   No Known Allergies     Medication List    STOP taking these medications   Calcium 600+D 600-800 MG-UNIT Tabs Generic drug: Calcium Carb-Cholecalciferol   naproxen sodium 220 MG tablet Commonly known as: ALEVE   pyridOXINE 100 MG tablet Commonly known as: VITAMIN B-6   vitamin B-12 500 MCG tablet Commonly known as: CYANOCOBALAMIN   vitamin C 500 MG tablet Commonly known as: ASCORBIC ACID   Vitamin D3 25 MCG tablet Commonly known as: Vitamin D     TAKE these medications   Tylenol 8 Hour Arthritis Pain 650 MG CR tablet Generic drug: acetaminophen Take 650 mg by mouth every 8 (eight) hours as needed for pain.   acetaminophen 500 MG tablet Commonly known as: TYLENOL Take 1,000 mg by mouth every 6 (six) hours as needed for moderate pain or headache.   methocarbamol 500 MG tablet Commonly known as: ROBAXIN Take 1 tablet (500 mg total) by mouth every 6 (six) hours as needed for muscle spasms.   oxyCODONE 5 MG immediate release tablet Commonly known as: Oxy IR/ROXICODONE Take 1-2 tablets (5-10 mg total) by mouth every 6 (six) hours as needed for severe pain.   rivaroxaban 10 MG Tabs tablet Commonly known as: XARELTO Take 1 tablet (10 mg total) by mouth daily with breakfast for 20 days. Then take one 81 mg aspirin once a day for three weeks. Then discontinue aspirin.   Systane 0.4-0.3 % Soln Generic drug: Polyethyl Glycol-Propyl Glycol Place 1 application into both eyes daily as needed (dry eye).   traMADol 50 MG tablet Commonly known as: ULTRAM Take 1-2  tablets (50-100 mg total) by mouth every 6 (six) hours as needed for moderate pain.            Discharge Care Instructions  (From admission, onward)         Start     Ordered   10/30/19 0000  Weight bearing as tolerated     10/30/19 0806   10/30/19 0000  Change dressing    Comments: You may remove the bulky bandage (ACE wrap and gauze) two days after surgery. You will have an adhesive waterproof bandage underneath. Leave this in place until your first follow-up appointment.   10/30/19 0806         Follow-up Information    [facility] Go on 10/31/2019.   Why: You are scheduled for a physical therapy appointment on 10-31-19 at 3:15 pm.  Contact information: [place] [address] [phone]        Gaynelle Arabian, MD. Go on 11/11/2019.   Specialty: Orthopedic Surgery Why: You are scheduled for a post-operative appointment on 11-11-19 at 4:00 pm.  Contact information:  [place] [address] [phone]           Signed: Theresa Duty, PA-C Orthopedic Surgery 11/05/2019, 8:53 AM

## 2019-11-06 DIAGNOSIS — M25661 Stiffness of right knee, not elsewhere classified: Secondary | ICD-10-CM | POA: Diagnosis not present

## 2019-11-06 DIAGNOSIS — M25561 Pain in right knee: Secondary | ICD-10-CM | POA: Diagnosis not present

## 2019-11-10 DIAGNOSIS — M25661 Stiffness of right knee, not elsewhere classified: Secondary | ICD-10-CM | POA: Diagnosis not present

## 2019-11-10 DIAGNOSIS — M25561 Pain in right knee: Secondary | ICD-10-CM | POA: Diagnosis not present

## 2019-11-11 DIAGNOSIS — Z471 Aftercare following joint replacement surgery: Secondary | ICD-10-CM | POA: Insufficient documentation

## 2019-11-12 DIAGNOSIS — M25661 Stiffness of right knee, not elsewhere classified: Secondary | ICD-10-CM | POA: Diagnosis not present

## 2019-11-12 DIAGNOSIS — M25561 Pain in right knee: Secondary | ICD-10-CM | POA: Diagnosis not present

## 2019-11-19 DIAGNOSIS — M25561 Pain in right knee: Secondary | ICD-10-CM | POA: Diagnosis not present

## 2019-11-19 DIAGNOSIS — M25661 Stiffness of right knee, not elsewhere classified: Secondary | ICD-10-CM | POA: Diagnosis not present

## 2019-11-21 DIAGNOSIS — M25561 Pain in right knee: Secondary | ICD-10-CM | POA: Diagnosis not present

## 2019-11-21 DIAGNOSIS — M25661 Stiffness of right knee, not elsewhere classified: Secondary | ICD-10-CM | POA: Diagnosis not present

## 2019-11-24 DIAGNOSIS — M25661 Stiffness of right knee, not elsewhere classified: Secondary | ICD-10-CM | POA: Diagnosis not present

## 2019-11-24 DIAGNOSIS — M25561 Pain in right knee: Secondary | ICD-10-CM | POA: Diagnosis not present

## 2019-11-27 DIAGNOSIS — M25561 Pain in right knee: Secondary | ICD-10-CM | POA: Diagnosis not present

## 2019-11-27 DIAGNOSIS — M25661 Stiffness of right knee, not elsewhere classified: Secondary | ICD-10-CM | POA: Diagnosis not present

## 2019-12-01 DIAGNOSIS — M25661 Stiffness of right knee, not elsewhere classified: Secondary | ICD-10-CM | POA: Diagnosis not present

## 2019-12-01 DIAGNOSIS — M25561 Pain in right knee: Secondary | ICD-10-CM | POA: Diagnosis not present

## 2019-12-04 DIAGNOSIS — M25561 Pain in right knee: Secondary | ICD-10-CM | POA: Diagnosis not present

## 2019-12-04 DIAGNOSIS — M25661 Stiffness of right knee, not elsewhere classified: Secondary | ICD-10-CM | POA: Diagnosis not present

## 2019-12-11 DIAGNOSIS — K829 Disease of gallbladder, unspecified: Secondary | ICD-10-CM | POA: Insufficient documentation

## 2019-12-11 DIAGNOSIS — N390 Urinary tract infection, site not specified: Secondary | ICD-10-CM | POA: Insufficient documentation

## 2019-12-11 DIAGNOSIS — I10 Essential (primary) hypertension: Secondary | ICD-10-CM | POA: Insufficient documentation

## 2019-12-11 DIAGNOSIS — G43909 Migraine, unspecified, not intractable, without status migrainosus: Secondary | ICD-10-CM | POA: Insufficient documentation

## 2019-12-11 DIAGNOSIS — D4959 Neoplasm of unspecified behavior of other genitourinary organ: Secondary | ICD-10-CM | POA: Insufficient documentation

## 2019-12-18 ENCOUNTER — Other Ambulatory Visit: Payer: Self-pay | Admitting: Obstetrics and Gynecology

## 2019-12-18 DIAGNOSIS — N6452 Nipple discharge: Secondary | ICD-10-CM

## 2020-01-06 ENCOUNTER — Other Ambulatory Visit: Payer: Self-pay

## 2020-01-06 ENCOUNTER — Ambulatory Visit: Payer: Medicare Other

## 2020-01-06 ENCOUNTER — Ambulatory Visit
Admission: RE | Admit: 2020-01-06 | Discharge: 2020-01-06 | Disposition: A | Discharge status: Home / Self Care | Payer: Medicare Other | Source: Ambulatory Visit | Attending: Obstetrics and Gynecology | Admitting: Obstetrics and Gynecology

## 2020-01-06 DIAGNOSIS — N6452 Nipple discharge: Secondary | ICD-10-CM

## 2020-02-04 DIAGNOSIS — Z20822 Contact with and (suspected) exposure to covid-19: Secondary | ICD-10-CM | POA: Diagnosis not present

## 2020-04-04 DIAGNOSIS — Z23 Encounter for immunization: Secondary | ICD-10-CM | POA: Diagnosis not present

## 2020-06-02 DIAGNOSIS — R739 Hyperglycemia, unspecified: Secondary | ICD-10-CM | POA: Diagnosis not present

## 2020-06-02 DIAGNOSIS — E876 Hypokalemia: Secondary | ICD-10-CM | POA: Diagnosis not present

## 2020-06-06 IMAGING — CT CT ANGIO NECK
2 of 12 series · 6 of 34 positions shown · IV contrast (OMNI 350)
Comparison: Head and face CT 02/20/2013.

CLINICAL DATA: 72-year-old female with vertigo since waking this
morning. Left arm tingling and back pain. Colonoscopy yesterday.
Vomiting.

EXAM:
CT ANGIOGRAPHY HEAD AND NECK
TECHNIQUE: Multidetector CT imaging of the head and neck was performed using
the standard protocol during bolus administration of intravenous
contrast. Multiplanar CT image reconstructions and MIPs were
obtained to evaluate the vascular anatomy. Carotid stenosis
measurements (when applicable) are obtained utilizing NASCET
criteria, using the distal internal carotid diameter as the
denominator.
CONTRAST:  75mL QWTDQS-N91 IOPAMIDOL (QWTDQS-N91) INJECTION 76%

[Series 8: sagittal · sagittal · 0.34mm/px · 1 of 64 slices shown]
[im 10/64  soft-tissue]
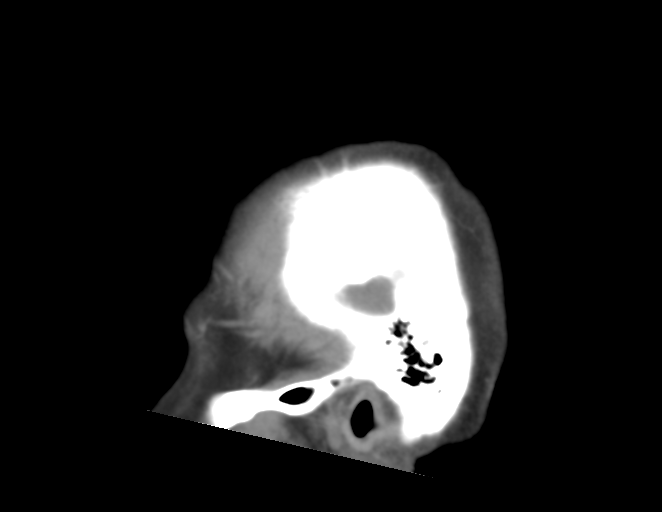

[Series 11: cta neck axial · axial · 0.39mm/px · z∈[-224,-8]mm · 5 of 324 slices shown]
[im 54/324  soft-tissue]
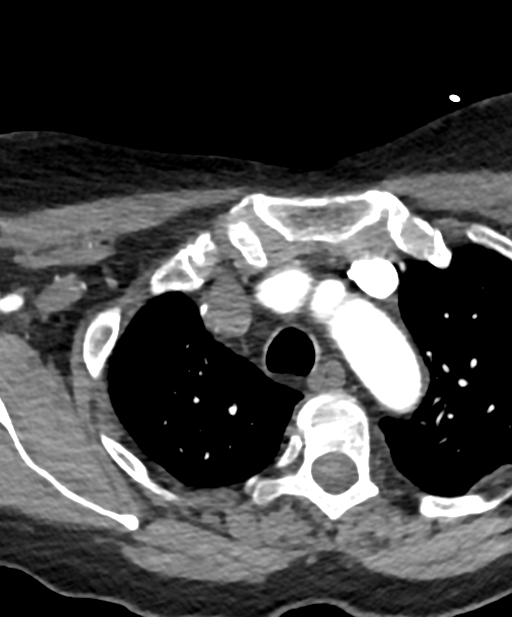
[im 108/324  bone]
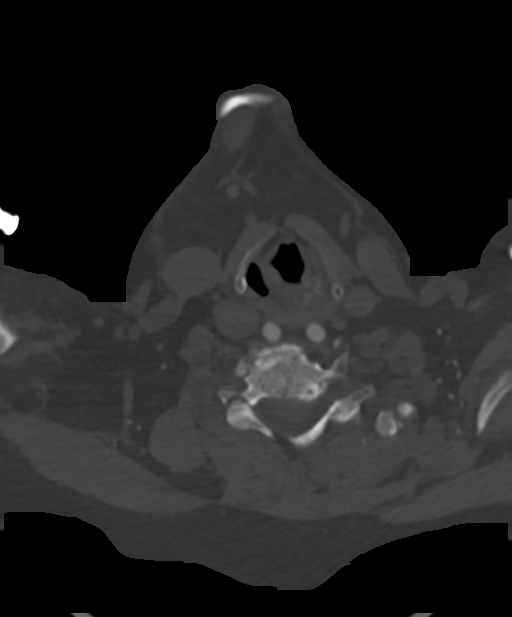
[im 162/324  soft-tissue]
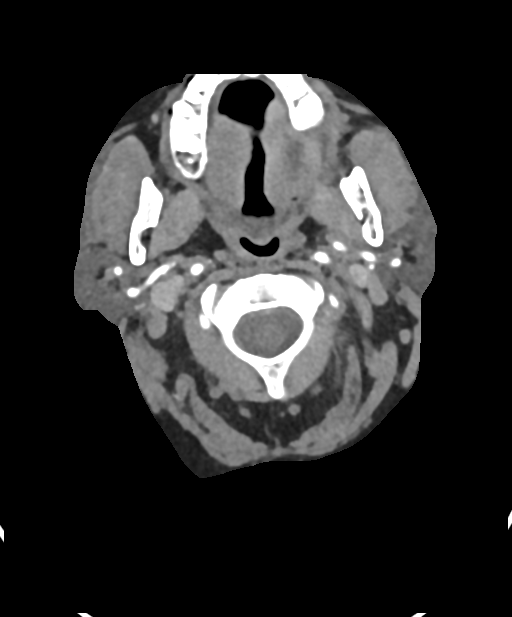
[im 216/324  bone]
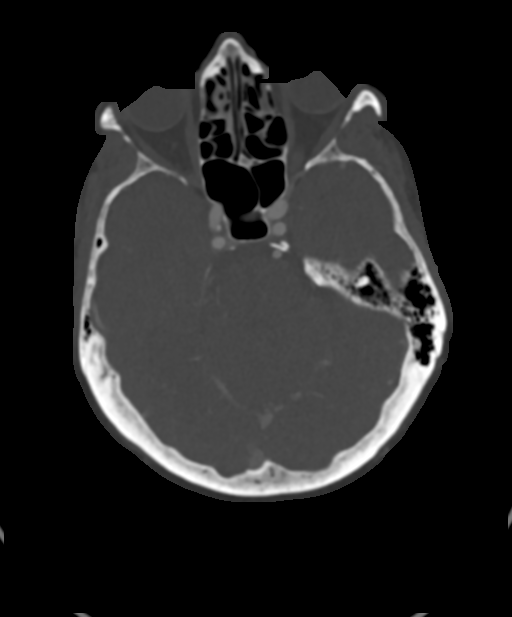
[im 270/324  soft-tissue]
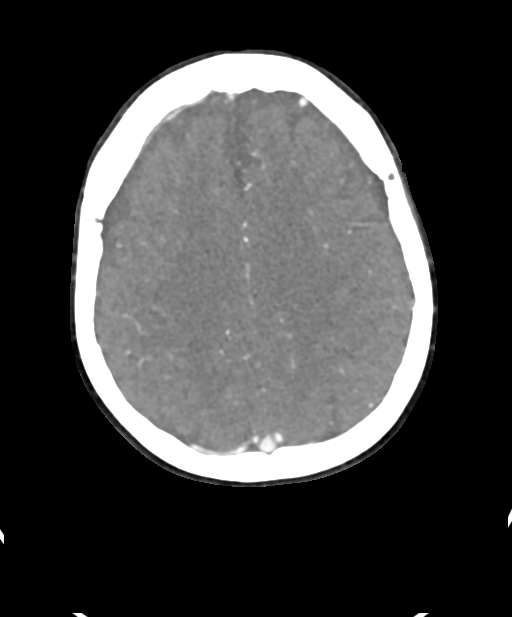

[6 of 34 positions shown; findings below may reference images not displayed]

FINDINGS: CT HEAD

Brain: Cerebral volume remains normal for age. No midline shift,
ventriculomegaly, mass effect, evidence of mass lesion, intracranial
hemorrhage or evidence of cortically based acute infarction. No
cortical encephalomalacia identified. Mild for age scattered white
matter hypodensity, possible involvement of the deep white matter
capsules. Negative CT appearance of the brainstem and cerebellum.

Calvarium and skull base: Negative.

Paranasal sinuses: Stable and well pneumatized. Tympanic cavities
and mastoids are clear.

Orbits: Postoperative changes to both globes, otherwise negative
orbit and scalp soft tissues.

CTA NECK

Skeleton: Widespread degenerative changes in the cervical spine with
mid cervical ankylosis, adjacent segment spondylolisthesis, disc and
facet degeneration. No acute osseous abnormality identified.

Upper chest: Mild upper lung gas trapping suspected. No superior
mediastinal lymphadenopathy.

Other neck: Negative aside from a partially retropharyngeal course
of the carotids (normal variant).

Aortic arch: 4 vessel arch configuration, the left vertebral arises
directly from the arch. No significant arch or great vessel origin
atherosclerosis.

Right carotid system: Tortuous brachiocephalic artery and proximal
right CCA. Retropharyngeal course of the right CCA at the thoracic
inlet. Negative right carotid bifurcation. Tortuous but otherwise
negative cervical right ICA.

Left carotid system: Mildly tortuous left CCA with a retropharyngeal
course. Negative left carotid bifurcation. Tortuous but otherwise
negative cervical left ICA.

Vertebral arteries:
Tortuous proximal right subclavian artery with a kinked appearance
at the thoracic inlet. The right vertebral artery is dominant and
dolichoectatic with a capacious origin. Tortuous right V1 segment.
Patent and mildly dolichoectatic right vertebral artery to the skull
base.

The left vertebral artery is non dominant and arises directly from
the arch. Tortuous left V1 segment and late entry into the cervical
transverse foramen. Patent left vertebral artery to the skull base
without stenosis.

CTA HEAD

Posterior circulation: Patent distal vertebral arteries without
stenosis, the right is dominant. Both a ICAs appear dominant.
Tortuous basilar artery without stenosis. Normal SCA and PCA
origins. Tortuous left P1 segment. Both posterior communicating
arteries are present. Bilateral PCA branches are within normal
limits.

Anterior circulation: Both ICA siphons are patent. Mild bilateral
siphon dolichoectasia and calcified plaque with no stenosis.
Ophthalmic and posterior communicating artery origins are normal.
Patent carotid termini. Normal MCA and ACA origins. Anterior
communicating artery and bilateral ACA branches are within normal
limits. Left MCA M1 segment, trifurcation, and left MCA branches are
within normal limits. Right MCA M1 segment, bifurcation, and right
MCA branches are within normal limits.

Venous sinuses: Patent.

Anatomic variants: Dominant right vertebral artery and the left
arises directly from the aortic arch.

Delayed phase: No abnormal enhancement identified.

Review of the MIP images confirms the above findings
IMPRESSION: 1. Negative for large vessel occlusion, and mild for age
atherosclerosis.
Generalized arterial tortuosity, but no arterial stenosis.
2. No acute intracranial abnormality. Mild for age white matter
changes, most commonly due to chronic small vessel disease.
3. Advanced cervical spine degeneration with combined ankylosis and
spondylolisthesis.

## 2020-06-06 IMAGING — DX DG CHEST 2V
2 series · 2 of 2 positions shown · non-contrast
Comparison: 05/17/2017.

CLINICAL DATA: Dizziness.

EXAM:
CHEST - 2 VIEW

[chest lat]
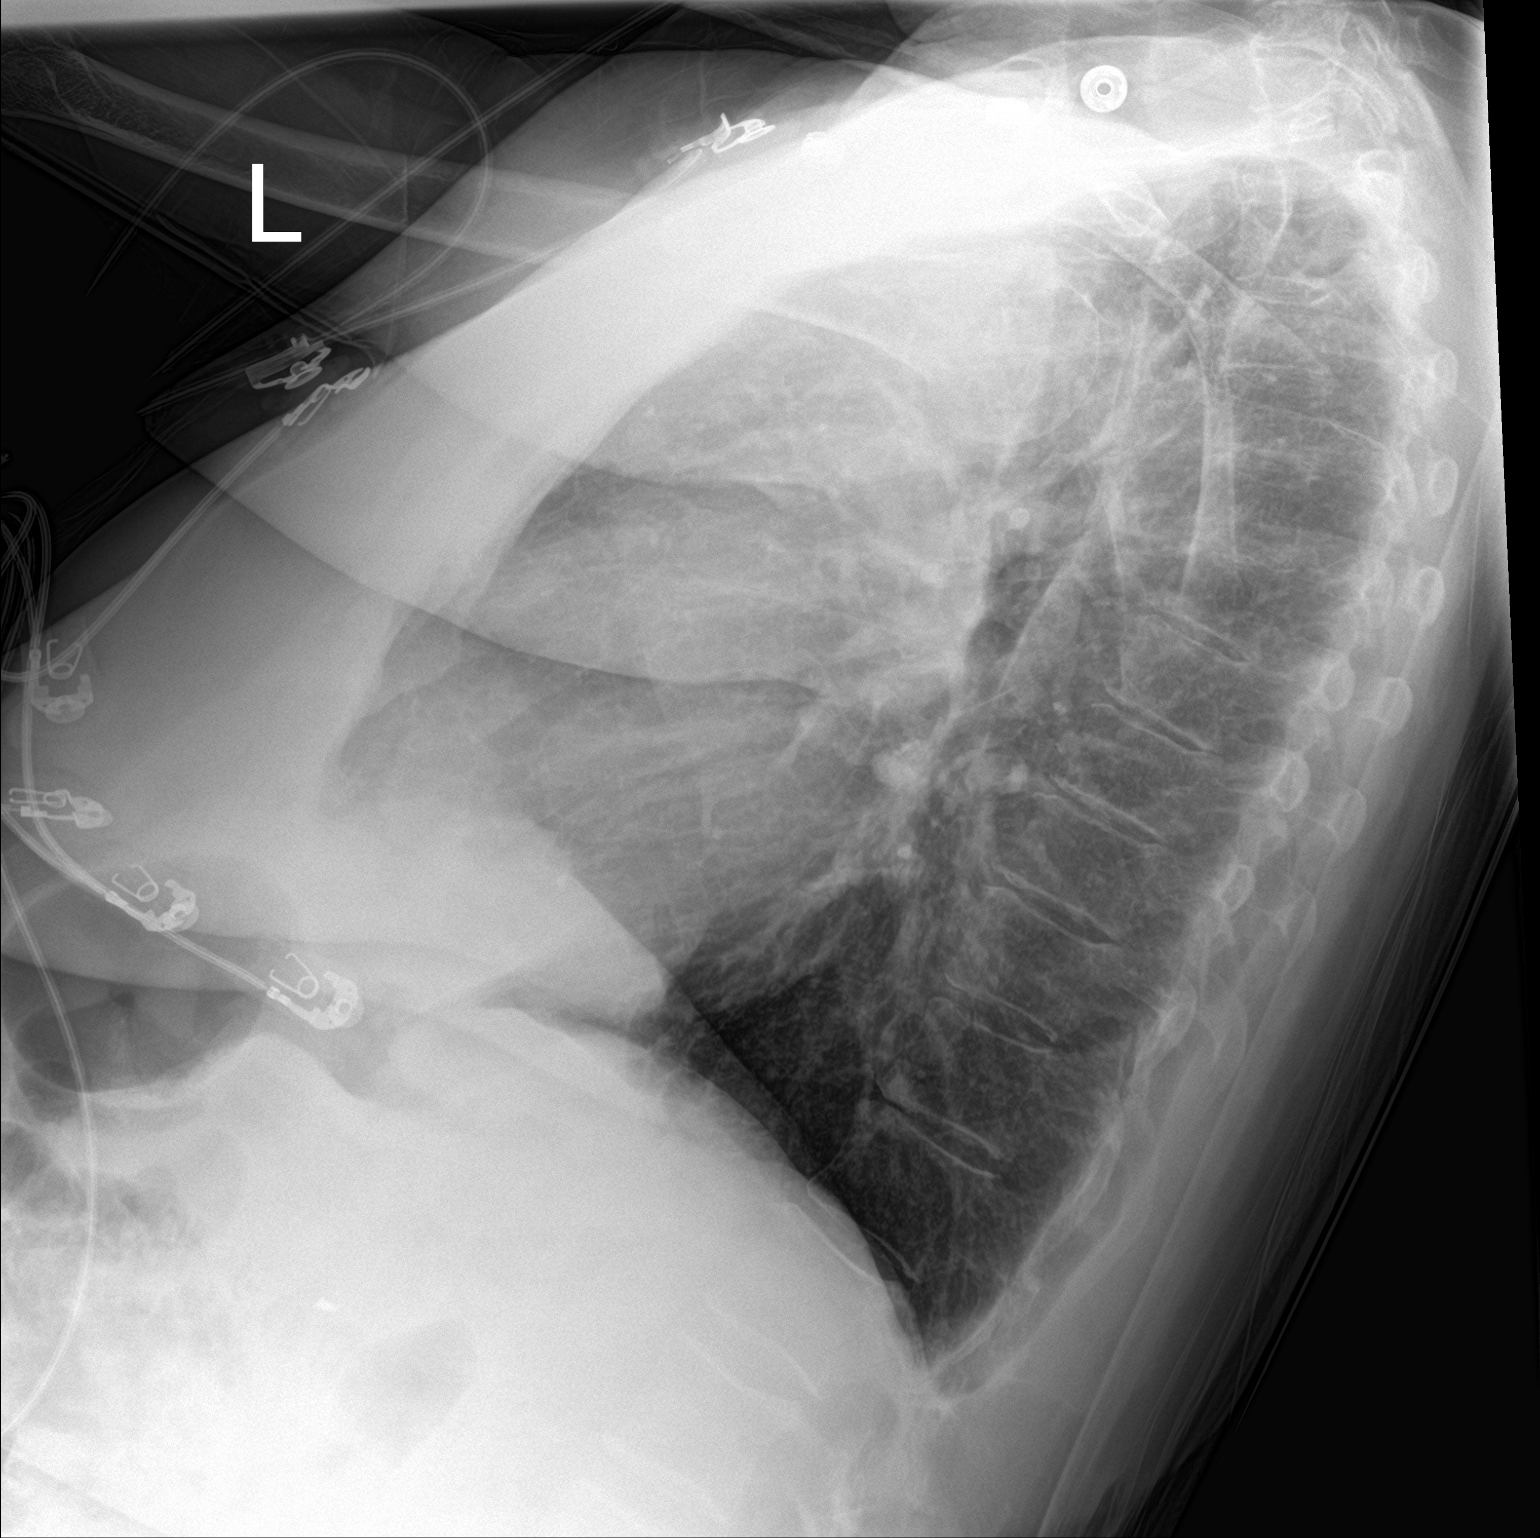

[chest ap]
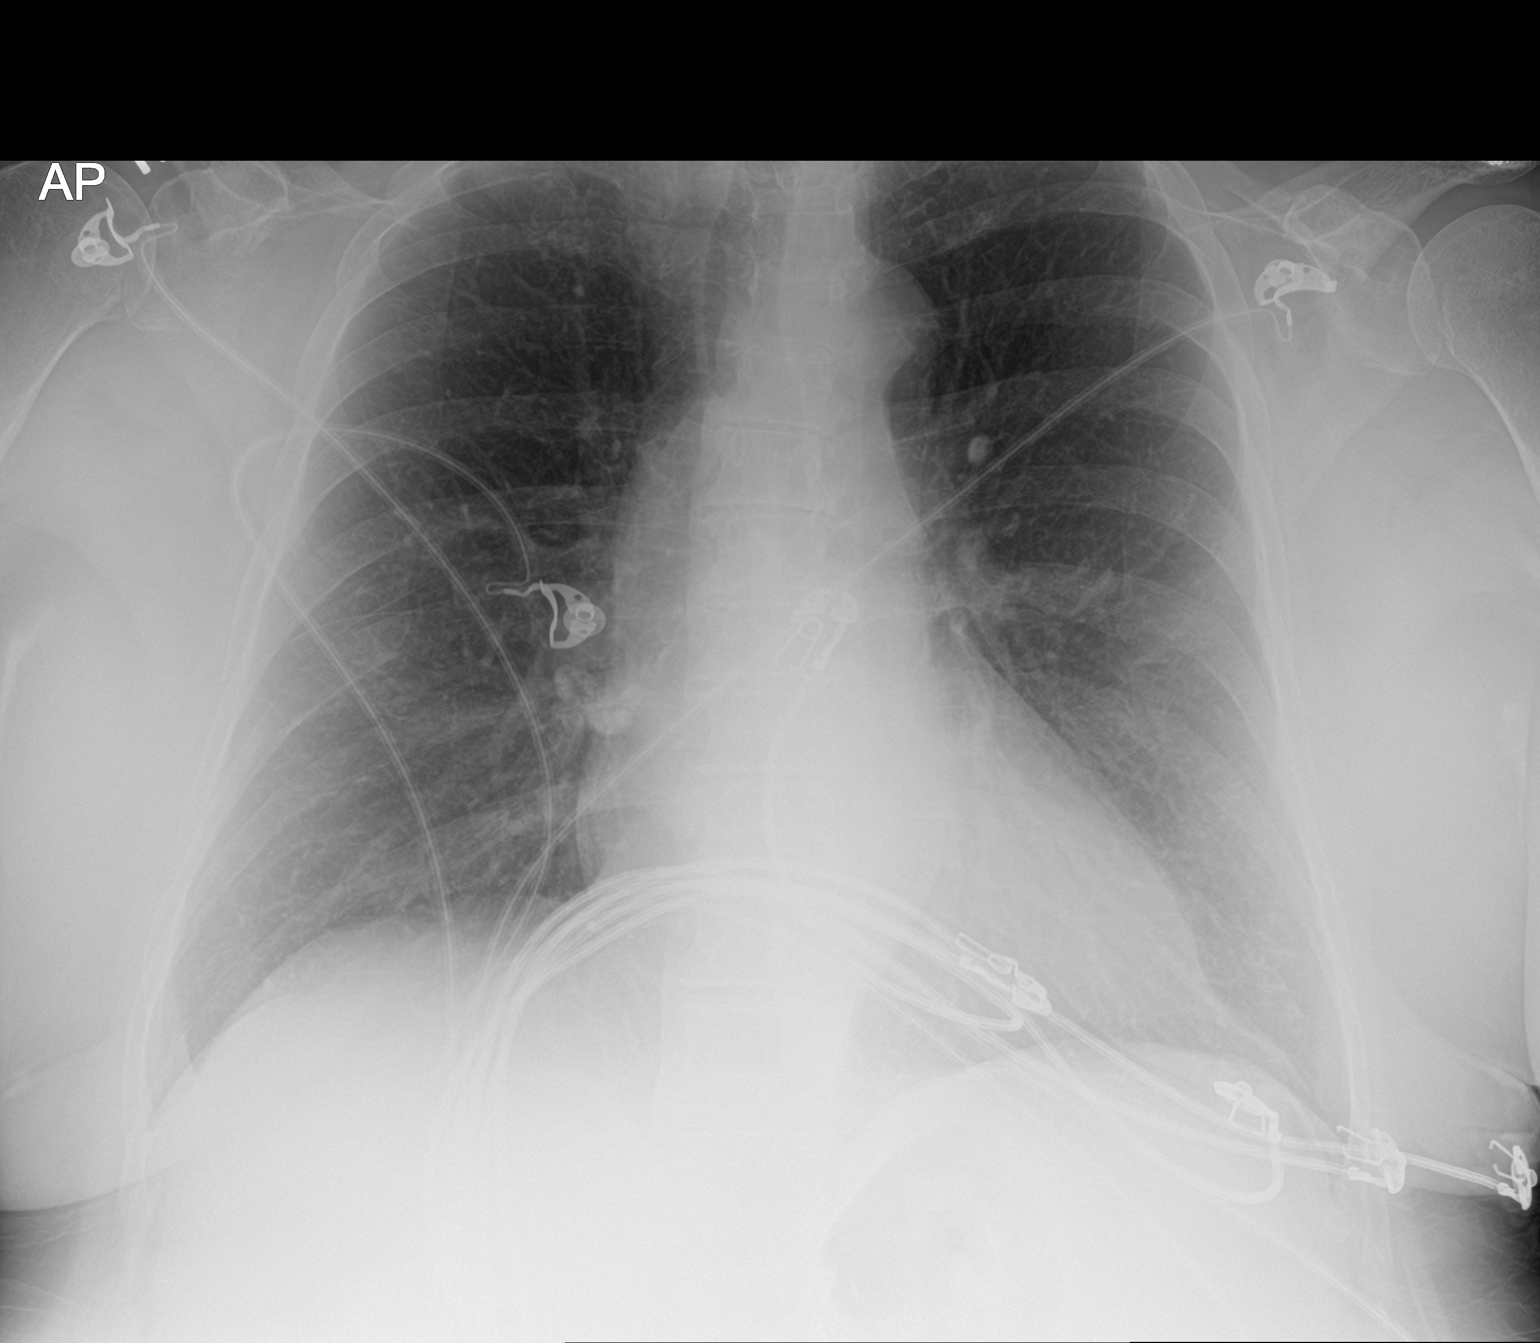

[2 of 2 positions shown; findings below may reference images not displayed]

FINDINGS: Mediastinum and hilar structures normal. Heart size normal. No focal
infiltrate. No pleural effusion or pneumothorax. No acute bony
abnormality.
IMPRESSION: No acute cardiopulmonary disease.

## 2020-06-07 DIAGNOSIS — K625 Hemorrhage of anus and rectum: Secondary | ICD-10-CM | POA: Diagnosis not present

## 2020-06-07 DIAGNOSIS — Z86718 Personal history of other venous thrombosis and embolism: Secondary | ICD-10-CM | POA: Diagnosis not present

## 2020-06-07 DIAGNOSIS — Z0000 Encounter for general adult medical examination without abnormal findings: Secondary | ICD-10-CM | POA: Diagnosis not present

## 2020-06-07 DIAGNOSIS — Z9189 Other specified personal risk factors, not elsewhere classified: Secondary | ICD-10-CM | POA: Diagnosis not present

## 2020-06-07 DIAGNOSIS — M8580 Other specified disorders of bone density and structure, unspecified site: Secondary | ICD-10-CM | POA: Diagnosis not present

## 2020-06-15 DIAGNOSIS — Z20822 Contact with and (suspected) exposure to covid-19: Secondary | ICD-10-CM | POA: Diagnosis not present

## 2020-07-06 DIAGNOSIS — H43813 Vitreous degeneration, bilateral: Secondary | ICD-10-CM | POA: Diagnosis not present

## 2020-09-29 DIAGNOSIS — R140 Abdominal distension (gaseous): Secondary | ICD-10-CM | POA: Diagnosis not present

## 2020-09-29 DIAGNOSIS — K921 Melena: Secondary | ICD-10-CM | POA: Diagnosis not present

## 2020-10-27 DIAGNOSIS — Z96651 Presence of right artificial knee joint: Secondary | ICD-10-CM | POA: Diagnosis not present

## 2021-01-04 DIAGNOSIS — Z1231 Encounter for screening mammogram for malignant neoplasm of breast: Secondary | ICD-10-CM | POA: Diagnosis not present

## 2021-01-04 DIAGNOSIS — Z124 Encounter for screening for malignant neoplasm of cervix: Secondary | ICD-10-CM | POA: Diagnosis not present

## 2021-01-04 DIAGNOSIS — N958 Other specified menopausal and perimenopausal disorders: Secondary | ICD-10-CM | POA: Diagnosis not present

## 2021-01-04 DIAGNOSIS — Z6837 Body mass index (BMI) 37.0-37.9, adult: Secondary | ICD-10-CM | POA: Diagnosis not present

## 2021-01-04 DIAGNOSIS — M8588 Other specified disorders of bone density and structure, other site: Secondary | ICD-10-CM | POA: Diagnosis not present

## 2021-01-04 DIAGNOSIS — Z779 Other contact with and (suspected) exposures hazardous to health: Secondary | ICD-10-CM | POA: Diagnosis not present

## 2021-01-04 DIAGNOSIS — Z7689 Persons encountering health services in other specified circumstances: Secondary | ICD-10-CM | POA: Diagnosis not present

## 2021-01-04 DIAGNOSIS — N952 Postmenopausal atrophic vaginitis: Secondary | ICD-10-CM | POA: Diagnosis not present

## 2021-02-15 DIAGNOSIS — R079 Chest pain, unspecified: Secondary | ICD-10-CM | POA: Diagnosis not present

## 2021-02-15 DIAGNOSIS — M810 Age-related osteoporosis without current pathological fracture: Secondary | ICD-10-CM | POA: Diagnosis not present

## 2021-02-15 DIAGNOSIS — N952 Postmenopausal atrophic vaginitis: Secondary | ICD-10-CM | POA: Diagnosis not present

## 2021-04-13 DIAGNOSIS — Z23 Encounter for immunization: Secondary | ICD-10-CM | POA: Diagnosis not present

## 2021-05-02 DIAGNOSIS — Z20828 Contact with and (suspected) exposure to other viral communicable diseases: Secondary | ICD-10-CM | POA: Diagnosis not present

## 2021-05-09 DIAGNOSIS — R0789 Other chest pain: Secondary | ICD-10-CM | POA: Diagnosis not present

## 2021-05-09 DIAGNOSIS — R079 Chest pain, unspecified: Secondary | ICD-10-CM | POA: Diagnosis not present

## 2021-05-09 DIAGNOSIS — N644 Mastodynia: Secondary | ICD-10-CM | POA: Diagnosis not present

## 2021-05-09 DIAGNOSIS — N952 Postmenopausal atrophic vaginitis: Secondary | ICD-10-CM | POA: Diagnosis not present

## 2021-06-09 ENCOUNTER — Other Ambulatory Visit: Payer: Self-pay | Admitting: Obstetrics and Gynecology

## 2021-06-09 DIAGNOSIS — N644 Mastodynia: Secondary | ICD-10-CM

## 2021-06-24 DIAGNOSIS — M810 Age-related osteoporosis without current pathological fracture: Secondary | ICD-10-CM | POA: Diagnosis not present

## 2021-06-24 DIAGNOSIS — Z0000 Encounter for general adult medical examination without abnormal findings: Secondary | ICD-10-CM | POA: Diagnosis not present

## 2021-06-24 DIAGNOSIS — R739 Hyperglycemia, unspecified: Secondary | ICD-10-CM | POA: Diagnosis not present

## 2021-06-24 DIAGNOSIS — R7401 Elevation of levels of liver transaminase levels: Secondary | ICD-10-CM | POA: Diagnosis not present

## 2021-06-27 DIAGNOSIS — R0781 Pleurodynia: Secondary | ICD-10-CM | POA: Diagnosis not present

## 2021-06-27 DIAGNOSIS — G8929 Other chronic pain: Secondary | ICD-10-CM | POA: Diagnosis not present

## 2021-06-27 DIAGNOSIS — M8580 Other specified disorders of bone density and structure, unspecified site: Secondary | ICD-10-CM | POA: Diagnosis not present

## 2021-06-27 DIAGNOSIS — Z0000 Encounter for general adult medical examination without abnormal findings: Secondary | ICD-10-CM | POA: Diagnosis not present

## 2021-06-27 DIAGNOSIS — Z789 Other specified health status: Secondary | ICD-10-CM | POA: Diagnosis not present

## 2021-06-27 DIAGNOSIS — K625 Hemorrhage of anus and rectum: Secondary | ICD-10-CM | POA: Diagnosis not present

## 2021-06-27 DIAGNOSIS — M545 Low back pain, unspecified: Secondary | ICD-10-CM | POA: Diagnosis not present

## 2021-07-06 ENCOUNTER — Ambulatory Visit (INDEPENDENT_AMBULATORY_CARE_PROVIDER_SITE_OTHER): Payer: Medicare Other | Admitting: Family Medicine

## 2021-07-06 ENCOUNTER — Ambulatory Visit (INDEPENDENT_AMBULATORY_CARE_PROVIDER_SITE_OTHER): Payer: Medicare Other

## 2021-07-06 ENCOUNTER — Other Ambulatory Visit: Payer: Self-pay

## 2021-07-06 VITALS — BP 124/82 | HR 89 | Ht 62.0 in | Wt 204.0 lb

## 2021-07-06 DIAGNOSIS — G8929 Other chronic pain: Secondary | ICD-10-CM

## 2021-07-06 DIAGNOSIS — M546 Pain in thoracic spine: Secondary | ICD-10-CM

## 2021-07-06 NOTE — Patient Instructions (Addendum)
Thank you for coming in today.   Please get an Xray today before you leave   Let me know when MRI results are back and we will see what the next steps are from there

## 2021-07-06 NOTE — Progress Notes (Signed)
° °  I, Sherry Barnes, LAT, ATC acting as a scribe for Sherry Leader, MD.  Subjective:    CC: Back and rib pain  HPI: Pt is a 76 y/o female c/o back and rib pain. Pt notes rib has been hurting since 2014 that she noticed when she was getting a mammogram. Pt locates pain to just below L breast on the anterior aspect of her torso. Pt locates pain to around the thoracic spine. Pt reports radiating pain to her mid-back w/ certain motions. Pt has a hx of spondylosis, scoliosis, C4 stenosis, and DDD.  Radiating pain: yes Numbness/tingling: yes- tingling along the spine Aggravates: certain motions, doing yard work Treatments tried: Tylenol  Pertinent review of Systems: No fevers or chills  Relevant historical information: History of lung disease.  History of DVT.   Objective:    Vitals:   07/06/21 1329  BP: 124/82  Pulse: 89  SpO2: 95%   General: Well Developed, well nourished, and in no acute distress.   MSK: Left rib: Tender palpation left anterior inferior rib. Normal thoracic motion.  Lab and Radiology Results  Patient provided x-rays of her left ribs and L-spine personally and independently interpreted.  No acute fractures are visible. Mild lumbar DDD present.  X-ray images T-spine obtained today personally and independently interpreted. No acute fractures.  No severe degenerative changes. Await formal radiology review   Impression and Recommendations:    Assessment and Plan: 76 y.o. female with left anterior inferior rib pain.  This is a chronic issue ongoing for years worsening somewhat recently.  She has had a pretty extensive work-up already by her primary care team.  She has a MRI of her abdomen and pelvis ordered that she is waiting to schedule.  I think this is reasonable.  However I think her pain is likely to be musculoskeletal in nature.  I think physical therapy is a good starting point.  She is hesitant to consider physical therapy and would like to get  imaging first.  This is reasonable to me.  Recommend proceed with MRI and let me know when these results are back so I can review them with her.  Plan to obtain x-ray T-spine today to screen for potential thoracic radiculopathy. Assuming we do not find any obvious answers on her upcoming imaging would recommend PT.  Based on where she lives likely referred to [place] [facility].Marland Kitchen  PDMP not reviewed this encounter. Orders Placed This Encounter  Procedures   DG Thoracic Spine W/Swimmers    Standing Status:   Future    Number of Occurrences:   1    Standing Expiration Date:   07/06/2022    Order Specific Question:   Reason for Exam (SYMPTOM  OR DIAGNOSIS REQUIRED)    Answer:   thoracic back pain    Order Specific Question:   Preferred imaging location?    Answer:   [facility]   No orders of the defined types were placed in this encounter.   Discussed warning signs or symptoms. Please see discharge instructions. Patient expresses understanding.   The above documentation has been reviewed and is accurate and complete Sherry Barnes, M.D.

## 2021-07-07 ENCOUNTER — Other Ambulatory Visit: Payer: Self-pay | Admitting: Obstetrics and Gynecology

## 2021-07-07 DIAGNOSIS — N644 Mastodynia: Secondary | ICD-10-CM

## 2021-07-07 DIAGNOSIS — R0789 Other chest pain: Secondary | ICD-10-CM

## 2021-07-08 NOTE — Progress Notes (Signed)
Thoracic spine x-ray shows some mild to medium arthritis changes in the back.

## 2021-07-14 DIAGNOSIS — N6452 Nipple discharge: Secondary | ICD-10-CM | POA: Diagnosis not present

## 2021-07-15 ENCOUNTER — Other Ambulatory Visit: Payer: Self-pay | Admitting: Obstetrics and Gynecology

## 2021-07-19 ENCOUNTER — Ambulatory Visit
Admission: RE | Admit: 2021-07-19 | Discharge: 2021-07-19 | Disposition: A | Discharge status: Home / Self Care | Payer: Medicare Other | Source: Ambulatory Visit | Attending: Obstetrics and Gynecology | Admitting: Obstetrics and Gynecology

## 2021-07-19 ENCOUNTER — Other Ambulatory Visit: Payer: Medicare Other

## 2021-07-19 ENCOUNTER — Other Ambulatory Visit: Payer: Self-pay

## 2021-07-19 DIAGNOSIS — N644 Mastodynia: Secondary | ICD-10-CM

## 2021-07-19 DIAGNOSIS — K7689 Other specified diseases of liver: Secondary | ICD-10-CM | POA: Diagnosis not present

## 2021-07-19 DIAGNOSIS — K625 Hemorrhage of anus and rectum: Secondary | ICD-10-CM | POA: Diagnosis not present

## 2021-07-19 MED ORDER — GADOBENATE DIMEGLUMINE 529 MG/ML IV SOLN
19.0000 mL | Freq: Once | INTRAVENOUS | Status: AC | PRN
  Administered 2021-07-19: 19 mL via INTRAVENOUS

## 2021-07-22 ENCOUNTER — Ambulatory Visit
Admission: RE | Admit: 2021-07-22 | Discharge: 2021-07-22 | Disposition: A | Discharge status: Home / Self Care | Payer: Medicare Other | Source: Ambulatory Visit | Attending: Obstetrics and Gynecology | Admitting: Obstetrics and Gynecology

## 2021-07-22 ENCOUNTER — Other Ambulatory Visit: Payer: Medicare Other

## 2021-07-22 ENCOUNTER — Other Ambulatory Visit: Payer: Self-pay

## 2021-07-22 ENCOUNTER — Other Ambulatory Visit: Payer: Self-pay | Admitting: Obstetrics and Gynecology

## 2021-07-22 DIAGNOSIS — N6452 Nipple discharge: Secondary | ICD-10-CM

## 2021-07-22 DIAGNOSIS — R0789 Other chest pain: Secondary | ICD-10-CM

## 2021-07-22 DIAGNOSIS — N644 Mastodynia: Secondary | ICD-10-CM | POA: Diagnosis not present

## 2021-07-22 MED ORDER — GADOBENATE DIMEGLUMINE 529 MG/ML IV SOLN
19.0000 mL | Freq: Once | INTRAVENOUS | Status: AC | PRN
  Administered 2021-07-22: 19 mL via INTRAVENOUS

## 2021-07-27 DIAGNOSIS — Z96653 Presence of artificial knee joint, bilateral: Secondary | ICD-10-CM | POA: Diagnosis not present

## 2021-08-15 DIAGNOSIS — Z20822 Contact with and (suspected) exposure to covid-19: Secondary | ICD-10-CM | POA: Diagnosis not present

## 2021-08-17 ENCOUNTER — Ambulatory Visit (INDEPENDENT_AMBULATORY_CARE_PROVIDER_SITE_OTHER): Payer: Medicare Other | Admitting: Podiatry

## 2021-08-17 ENCOUNTER — Other Ambulatory Visit: Payer: Self-pay

## 2021-08-17 ENCOUNTER — Encounter: Payer: Self-pay | Admitting: Podiatry

## 2021-08-17 DIAGNOSIS — M2041 Other hammer toe(s) (acquired), right foot: Secondary | ICD-10-CM

## 2021-08-17 DIAGNOSIS — L84 Corns and callosities: Secondary | ICD-10-CM

## 2021-08-18 NOTE — Progress Notes (Signed)
Subjective:  ? ?Patient ID: Sherry Barnes, female   DOB: 76 y.o.   MRN: [id number]  ? ?HPI ?Patient presents stating she has had a reoccurrence of lesions on both feet and states that she is doing very well but the lesions have started to get sore again and its been a number of years.  Does not remember changing history does not smoke likes to be active ? ? ?Review of Systems  ?All other systems reviewed and are negative. ? ? ?   ?Objective:  ?Physical Exam ?Vitals and nursing note reviewed.  ?Constitutional:   ?   Appearance: She is well-developed.  ?Pulmonary:  ?   Effort: Pulmonary effort is normal.  ?Musculoskeletal:     ?   General: Normal range of motion.  ?Skin: ?   General: Skin is warm.  ?Neurological:  ?   Mental Status: She is alert.  ?  ?Neurovascular status intact muscle strength found to be adequate range of motion adequate with exquisite discomfort with lesion formation bilateral plantar foot that is painful when pressed localized to the second and fifth metatarsal with lucent course.  Good digital perfusion ? ?   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 ?   ?Plan:  ?H&P discussed condition educated her on deformity and at this point [facility] sterile debridement accomplished and she can also try to do home therapies which I educated her on today.  Its been 6 years and hopefully this will last a long time also and will be seen back as needed ?   ? ? ?

## 2021-08-23 ENCOUNTER — Ambulatory Visit
Admission: RE | Admit: 2021-08-23 | Discharge: 2021-08-23 | Disposition: A | Discharge status: Home / Self Care | Payer: Medicare Other | Source: Ambulatory Visit | Attending: Obstetrics and Gynecology | Admitting: Obstetrics and Gynecology

## 2021-08-23 ENCOUNTER — Other Ambulatory Visit: Payer: Self-pay

## 2021-08-23 DIAGNOSIS — R922 Inconclusive mammogram: Secondary | ICD-10-CM | POA: Diagnosis not present

## 2021-08-23 DIAGNOSIS — N6452 Nipple discharge: Secondary | ICD-10-CM

## 2021-08-23 DIAGNOSIS — N6001 Solitary cyst of right breast: Secondary | ICD-10-CM | POA: Diagnosis not present

## 2021-09-09 DIAGNOSIS — Z20822 Contact with and (suspected) exposure to covid-19: Secondary | ICD-10-CM | POA: Diagnosis not present

## 2021-09-12 DIAGNOSIS — Z20822 Contact with and (suspected) exposure to covid-19: Secondary | ICD-10-CM | POA: Diagnosis not present

## 2021-09-16 DIAGNOSIS — Z20822 Contact with and (suspected) exposure to covid-19: Secondary | ICD-10-CM | POA: Diagnosis not present

## 2021-09-20 DIAGNOSIS — Z20822 Contact with and (suspected) exposure to covid-19: Secondary | ICD-10-CM | POA: Diagnosis not present

## 2021-10-03 DIAGNOSIS — Z20822 Contact with and (suspected) exposure to covid-19: Secondary | ICD-10-CM | POA: Diagnosis not present

## 2021-10-11 DIAGNOSIS — Z20822 Contact with and (suspected) exposure to covid-19: Secondary | ICD-10-CM | POA: Diagnosis not present

## 2021-10-24 DIAGNOSIS — Z20822 Contact with and (suspected) exposure to covid-19: Secondary | ICD-10-CM | POA: Diagnosis not present

## 2021-10-25 DIAGNOSIS — H43393 Other vitreous opacities, bilateral: Secondary | ICD-10-CM | POA: Diagnosis not present

## 2021-10-25 DIAGNOSIS — H43813 Vitreous degeneration, bilateral: Secondary | ICD-10-CM | POA: Diagnosis not present

## 2021-10-25 DIAGNOSIS — H52223 Regular astigmatism, bilateral: Secondary | ICD-10-CM | POA: Diagnosis not present

## 2021-10-25 DIAGNOSIS — Z9849 Cataract extraction status, unspecified eye: Secondary | ICD-10-CM | POA: Diagnosis not present

## 2021-10-25 DIAGNOSIS — H524 Presbyopia: Secondary | ICD-10-CM | POA: Diagnosis not present

## 2021-10-25 DIAGNOSIS — Z961 Presence of intraocular lens: Secondary | ICD-10-CM | POA: Diagnosis not present

## 2021-10-25 DIAGNOSIS — H5213 Myopia, bilateral: Secondary | ICD-10-CM | POA: Diagnosis not present

## 2021-11-09 DIAGNOSIS — M7062 Trochanteric bursitis, left hip: Secondary | ICD-10-CM | POA: Diagnosis not present

## 2022-01-31 DIAGNOSIS — Z1231 Encounter for screening mammogram for malignant neoplasm of breast: Secondary | ICD-10-CM | POA: Diagnosis not present

## 2022-01-31 DIAGNOSIS — Z01419 Encounter for gynecological examination (general) (routine) without abnormal findings: Secondary | ICD-10-CM | POA: Diagnosis not present

## 2022-01-31 DIAGNOSIS — N952 Postmenopausal atrophic vaginitis: Secondary | ICD-10-CM | POA: Diagnosis not present

## 2022-01-31 DIAGNOSIS — Z6836 Body mass index (BMI) 36.0-36.9, adult: Secondary | ICD-10-CM | POA: Diagnosis not present

## 2022-06-26 DIAGNOSIS — R739 Hyperglycemia, unspecified: Secondary | ICD-10-CM | POA: Diagnosis not present

## 2022-06-26 DIAGNOSIS — R7401 Elevation of levels of liver transaminase levels: Secondary | ICD-10-CM | POA: Diagnosis not present

## 2022-06-26 DIAGNOSIS — M810 Age-related osteoporosis without current pathological fracture: Secondary | ICD-10-CM | POA: Diagnosis not present

## 2022-06-29 DIAGNOSIS — Z0000 Encounter for general adult medical examination without abnormal findings: Secondary | ICD-10-CM | POA: Diagnosis not present

## 2022-06-29 DIAGNOSIS — M810 Age-related osteoporosis without current pathological fracture: Secondary | ICD-10-CM | POA: Diagnosis not present

## 2022-06-29 DIAGNOSIS — Z86718 Personal history of other venous thrombosis and embolism: Secondary | ICD-10-CM | POA: Diagnosis not present

## 2022-06-29 DIAGNOSIS — M159 Polyosteoarthritis, unspecified: Secondary | ICD-10-CM | POA: Diagnosis not present

## 2022-06-29 DIAGNOSIS — Z2821 Immunization not carried out because of patient refusal: Secondary | ICD-10-CM | POA: Diagnosis not present

## 2022-06-29 DIAGNOSIS — I8390 Asymptomatic varicose veins of unspecified lower extremity: Secondary | ICD-10-CM | POA: Diagnosis not present

## 2022-08-22 ENCOUNTER — Other Ambulatory Visit: Payer: Self-pay

## 2022-08-22 DIAGNOSIS — I8393 Asymptomatic varicose veins of bilateral lower extremities: Secondary | ICD-10-CM

## 2022-09-12 ENCOUNTER — Ambulatory Visit (HOSPITAL_COMMUNITY)
Admission: RE | Admit: 2022-09-12 | Discharge: 2022-09-12 | Disposition: A | Discharge status: Home / Self Care | Payer: Medicare Other | Source: Ambulatory Visit | Attending: Vascular Surgery | Admitting: Vascular Surgery

## 2022-09-12 DIAGNOSIS — I8393 Asymptomatic varicose veins of bilateral lower extremities: Secondary | ICD-10-CM

## 2022-09-19 ENCOUNTER — Ambulatory Visit (INDEPENDENT_AMBULATORY_CARE_PROVIDER_SITE_OTHER): Payer: Medicare Other | Admitting: Vascular Surgery

## 2022-09-19 ENCOUNTER — Encounter: Payer: Self-pay | Admitting: Vascular Surgery

## 2022-09-19 VITALS — BP 137/85 | HR 76 | Temp 98.3°F | Resp 14 | Ht 62.5 in | Wt 203.0 lb

## 2022-09-19 DIAGNOSIS — I872 Venous insufficiency (chronic) (peripheral): Secondary | ICD-10-CM

## 2022-09-19 NOTE — Progress Notes (Signed)
Patient name: Sherry Barnes MRN: [id number] DOB: Jul 11, 1945 Sex: female  REASON FOR CONSULT: Varicose veins  HPI: Sherry Barnes is a 77 y.o. female with history of left leg DVT that presents for evaluation of varicose veins.  She previously had bilateral great saphenous vein endovenous ablation at [facility] in 2018.  She states she has noticed more prominent varicosities particularly worse in the right leg.  She was worried about the health implications.  She is not wearing her compression stockings.  She denies any associated pain.  She does appreciate some leg swelling.  Past Medical History:  Diagnosis Date   Arthralgia of left elbow    Dyspnea    at times with exertion    Fainting spell    hx of 2014 - neck and nose fracture    Fainting spell    hx oxf 2014    GERD (gastroesophageal reflux disease)    at times   Granulomatous lung disease    PULMOLOGIST--  DR Lamonte Sakai--  PFT'S NORMAL   H/O hernia repair    ventral   History of cervical fracture    C5  laminar fx non-displace 02-21-2013 no surgical intervention   History of DVT of lower extremity    behind left knee--  2011  resolved   History of hiatal hernia    mild    History of vulvar dysplasia    Neck fracture    hx of after fainting spell in 2014 per patient    Osteoarthritis    Osteopenia    right hip, right neck   Osteoporosis    LEFT HIP AND NECK   Osteoporosis    left neck and left hip   PONV (postoperative nausea and vomiting)    weakness, trouble getting oxygenlevel up in PACU after 2016 surgery    Right knee meniscal tear    Vertigo    hx of in 2016    Wears glasses     Past Surgical History:  Procedure Laterality Date   ABDOMINAL HYSTERECTOMY  10-21-1996   AND BLADDER SLING   APPENDECTOMY     CARDIOVASCULAR STRESS TEST  08-26-2010   dr Shanon Brow harding   Low Risk perfusion scan/  observed defect is consistant with diaphragmatic attenuation, no significant wall motion abnormalities,  ef 78%   , small area mild reversible perfusion defect in the RCA territory (per cardiologist false positive)   CERVICAL CONE BIOPSY  05/15/1972   CHOLECYSTECTOMY  01/27/09   CONDYLECTOMY LEFT FIFTH TOE  06-16-1999   EYE SURGERY     bilateral cataract surgery 2019    GYNECOLOGIC CRYOSURGERY  1995    cervical dysplasia   HERNIA REPAIR     x2   KNEE ARTHROSCOPY Left 10/07/2014   Procedure: LEFT ARTHROSCOPY KNEE WITH MEDIAL MENISCAL DEBRIDEMENT;  Surgeon: Gaynelle Arabian, MD;  Location: [facility];  Service: Orthopedics;  Laterality: Left;   KNEE ARTHROSCOPY Right 07/28/2015   Procedure: RIGHT KNEE ARTHROSCOPY WITH MENISCUS DEBRIDEMENT;  Surgeon: Gaynelle Arabian, MD;  Location: [facility];  Service: Orthopedics;  Laterality: Right;   LAPAROTOMY W/ BILATERAL OVARIAN DERMOID CYSTECTOMY  04-06-1971   and APPENDECTOMY   laser varicso vein surgery     2018    NEUROPLASTY / TRANSPOSITION ULNAR NERVE AT ELBOW Left 02-24-2002   PARTIAL KNEE ARTHROPLASTY Left 07/26/2016   Procedure: LEFT MEDIAL UNICOMPARTMENTAL KNEE ARTHROPLASTY;  Surgeon: Gaynelle Arabian, MD;  Location: [facility];  Service: Orthopedics;  Laterality: Left;  Adductor Block  PARTIAL KNEE ARTHROPLASTY Right 10/29/2019   Procedure: RIGHT KNEE MEDIAL UNICOMPARTMENTAL ARTHROPLASTY;  Surgeon: Gaynelle Arabian, MD;  Location: [facility];  Service: Orthopedics;  Laterality: Right;   TONSILLECTOMY AND ADENOIDECTOMY  1970   TRANSTHORACIC ECHOCARDIOGRAM  04-22-2008   normal /   ef 60-65%/  mild TR   TUBAL LIGATION  0000000   UMBILICAL HERNIA REPAIR  01-27-2009   VENTRAL HERNIA REPAIR  05/02/2011   Procedure: LAPAROSCOPIC VENTRAL HERNIA;  Surgeon: Adin Hector, MD;  Location: [facility];  Service: General;  Laterality: N/A;  attempted Laparoscopic ventraL hernia repair with mesh, open ventral hernia repair    WIDE LOCAL EXCISION LEFT VULVA  06-26-2007    Family History  Problem Relation Age of Onset   Irritable bowel syndrome Mother    Other Mother        bladder  infections, gallstones, low thyroid   Heart disease Mother        3 vessel stints   Hyperthyroidism Mother    Emphysema Father    Heart disease Father        congestive heart failure, stints place   Other Sister        gallstones   Other Brother        reflux, mitral valve prolapse   Other Paternal Grandmother        TB   Hyperthyroidism Sister    Hyperparathyroidism Maternal Uncle     SOCIAL HISTORY: Social History   Socioeconomic History   Marital status: Married    Spouse name: Not on file   Number of children: 2   Years of education: 14   Highest education level: Not on file  Occupational History   Not on file  Tobacco Use   Smoking status: Former    Packs/day: 1.00    Years: 10.00    Additional pack years: 0.00    Total pack years: 10.00    Types: Cigarettes    Quit date: 02/17/1974    Years since quitting: 48.6   Smokeless tobacco: Never  Vaping Use   Vaping Use: Never used  Substance and Sexual Activity   Alcohol use: Yes    Alcohol/week: 1.0 standard drink of alcohol    Types: 1 Glasses of wine per week    Comment: seldom    Drug use: No   Sexual activity: Yes  Other Topics Concern   Not on file  Social History Narrative   Not on file   Social Determinants of Health   Financial Resource Strain: Not on file  Food Insecurity: Not on file  Transportation Needs: Not on file  Physical Activity: Not on file  Stress: Not on file  Social Connections: Not on file  Intimate Partner Violence: Not on file    No Known Allergies  Current Outpatient Medications  Medication Sig Dispense Refill   acetaminophen (TYLENOL) 500 MG tablet Take 1,000 mg by mouth every 6 (six) hours as needed for moderate pain or headache.      alendronate (FOSAMAX) 70 MG tablet Take 70 mg by mouth once a week. Take with a full glass of water on an empty stomach.     estradiol (VIVELLE-DOT) 0.05 MG/24HR patch Place 1 patch onto the skin 2 (two) times a week.     methocarbamol  (ROBAXIN) 500 MG tablet Take 1 tablet (500 mg total) by mouth every 6 (six) hours as needed for muscle spasms. 40 tablet 0   Polyethyl Glycol-Propyl Glycol 0.4-0.3 % SOLN Place 1  application into both eyes daily as needed (dry eye).      No current facility-administered medications for this visit.    REVIEW OF SYSTEMS:  [X]  denotes positive finding, [ ]  denotes negative finding Cardiac  Comments:  Chest pain or chest pressure:    Shortness of breath upon exertion:    Short of breath when lying flat:    Irregular heart rhythm:        Vascular    Pain in calf, thigh, or hip brought on by ambulation:    Pain in feet at night that wakes you up from your sleep:     Blood clot in your veins:    Leg swelling:  x       Pulmonary    Oxygen at home:    Productive cough:     Wheezing:         Neurologic    Sudden weakness in arms or legs:     Sudden numbness in arms or legs:     Sudden onset of difficulty speaking or slurred speech:    Temporary loss of vision in one eye:     Problems with dizziness:         Gastrointestinal    Blood in stool:     Vomited blood:         Genitourinary    Burning when urinating:     Blood in urine:        Psychiatric    Major depression:         Hematologic    Bleeding problems:    Problems with blood clotting too easily:        Skin    Rashes or ulcers:        Constitutional    Fever or chills:      PHYSICAL EXAM: Vitals:   09/19/22 1505  BP: 137/85  Pulse: 76  Resp: 14  Temp: 98.3 F (36.8 C)  TempSrc: Temporal  SpO2: 96%  Weight: 203 lb (92.1 kg)  Height: 5' 2.5" (1.588 m)    GENERAL: The patient is a well-nourished female, in no acute distress. The vital signs are documented above. CARDIAC: There is a regular rate and rhythm.  VASCULAR:  Bilateral DP pulses palpable Bilateral lower extremity varicosities with no venous ulcerations Prominent varicosity right distal thigh/calf PULMONARY: No respiratory  distress. ABDOMEN: Soft and non-tender. MUSCULOSKELETAL: There are no major deformities or cyanosis. NEUROLOGIC: No focal weakness or paresthesias are detected. SKIN: There are no ulcers or rashes noted. PSYCHIATRIC: The patient has a normal affect.  DATA:    Lower Venous Reflux Study   Patient Name:  NANDI TEAHAN  Date of Exam:   09/12/2022  Medical Rec #: [id number]         Accession #:    [id number]  Date of Birth: [birth year]         Patient Gender: F  Patient Age:   62 years  Exam Location:  [facility]  Procedure:      VAS [place] LOWER EXTREMITY VENOUS REFLUX  Referring Phys: Jamelle Haring    ---------------------------------------------------------------------------  -----    Indications: Venous insufficiency.    Risk Factors: Surgery History of right GSV ablation by Dr. Wenda Low.  Performing Technologist: Ralene Cork RVT     Examination Guidelines: A complete evaluation includes B-mode imaging,  spectral  Doppler, color Doppler, and power Doppler as needed of all accessible  portions  of each vessel. Bilateral testing is  considered an integral part of a  complete  examination. Limited examinations for reoccurring indications may be  performed  as noted. The reflux portion of the exam is performed with the patient in  reverse Trendelenburg.  Significant venous reflux is defined as >500 ms in the superficial venous  system, and >1 second in the deep venous system.     Venous Reflux Times  +--------------+---------+------+----------+------------+------------------  ----+  RIGHT        Reflux NoReflux  Reflux  Diameter cmsComments                                         Yes     Time                                        +--------------+---------+------+----------+------------+------------------  ----+  CFV          no                                                             +--------------+---------+------+----------+------------+------------------  ----+  FV prox       no                                                            +--------------+---------+------+----------+------------+------------------  ----+  FV mid        no                                                            +--------------+---------+------+----------+------------+------------------  ----+  GSV at [facility]              yes   >500 ms     0.653                             +--------------+---------+------+----------+------------+------------------  ----+  GSV prox thigh          yes   >500 ms     0.692                             +--------------+---------+------+----------+------------+------------------  ----+  GSV mid thigh                                      tortuous varicose  vein  +--------------+---------+------+----------+------------+------------------  ----+  GSV dist thigh                                     tortuous varicose  vein  +--------------+---------+------+----------+------------+------------------  ----+  GSV at knee                                        tortuous varicose  vein  +--------------+---------+------+----------+------------+------------------  ----+  GSV prox calf                                      tortuous varicose  vein  +--------------+---------+------+----------+------------+------------------  ----+  SSV Pop Fossa no                           0.42                             +--------------+---------+------+----------+------------+------------------  ----+  SSV prox calf           yes   >500 ms     0.392                             +--------------+---------+------+----------+------------+------------------  ----+  SSV mid calf            yes   >500 ms      0.34                              +--------------+---------+------+----------+------------+------------------  ----+  VV at knee              yes   >500 ms     0.548                             +--------------+---------+------+----------+------------+------------------  ----+     Summary:  Right:  - No evidence of deep vein thrombosis seen in the right lower extremity,  from the common femoral through the popliteal veins.  - No evidence of superficial venous thrombosis in the right lower  extremity.    - No evidence of deep vein reflux.   - Superficial vein reflux in the SFJ and proximal GSV.  - Varicose vein reflux.    *See table(s) above for measurements and observations.   Electronically signed by Harold Barban MD on 09/12/2022 at 7:23:16 PM.   Assessment/Plan:  77 year old female presents for evaluation of bilateral lower extremity varicose veins worse in the right leg.  Her exam is consistent with CEAP classification C3 given edema.  She previously had bilateral great saphenous vein laser ablation in 2018 at [facility]. She states her right leg is more worrisome.  I discussed the etiology for venous insufficiency with valvular reflux as this leads to varicose veins and relates to her leg swelling as well.  I recommended elevation, compression, exercise, weight loss.  I did discuss we could reevaluate for stab phlebectomies in the right leg given she already had venous ablation of the great saphenous vein but she is comfortable with conservative measures for now which I think is reasonable.  She is not having any pain or discomfort here.  She will let me know if this changes.  I discussed if venous disease progresses can lead to venous ulcerations as she had questions about long-term implications of venous insufficiency.   Marty Heck, MD  [facility] Office: [phone]

## 2022-12-25 ENCOUNTER — Encounter: Payer: Self-pay | Admitting: Podiatry

## 2022-12-25 ENCOUNTER — Ambulatory Visit (INDEPENDENT_AMBULATORY_CARE_PROVIDER_SITE_OTHER): Payer: Medicare Other | Admitting: Podiatry

## 2022-12-25 DIAGNOSIS — L84 Corns and callosities: Secondary | ICD-10-CM

## 2022-12-25 NOTE — Progress Notes (Signed)
Subjective:   Patient ID: Sherry Barnes, female   DOB: 77 y.o.   MRN: [id number]   HPI Patient presents with painful calluses on the bottom of both feet   ROS      Objective:  Physical Exam  Neurovascular status intact keratotic lesions plantar aspect both feet painful     Assessment:  Chronic lesion formation     Plan:  Debridement lesions bilateral no angiogenic bleeding reappoint routine care

## 2023-01-04 DIAGNOSIS — H353131 Nonexudative age-related macular degeneration, bilateral, early dry stage: Secondary | ICD-10-CM | POA: Diagnosis not present

## 2023-01-04 DIAGNOSIS — H52223 Regular astigmatism, bilateral: Secondary | ICD-10-CM | POA: Diagnosis not present

## 2023-01-04 DIAGNOSIS — H524 Presbyopia: Secondary | ICD-10-CM | POA: Diagnosis not present

## 2023-01-04 DIAGNOSIS — Z9849 Cataract extraction status, unspecified eye: Secondary | ICD-10-CM | POA: Diagnosis not present

## 2023-01-04 DIAGNOSIS — H53143 Visual discomfort, bilateral: Secondary | ICD-10-CM | POA: Diagnosis not present

## 2023-01-04 DIAGNOSIS — Z961 Presence of intraocular lens: Secondary | ICD-10-CM | POA: Diagnosis not present

## 2023-01-04 DIAGNOSIS — H5213 Myopia, bilateral: Secondary | ICD-10-CM | POA: Diagnosis not present

## 2023-01-04 DIAGNOSIS — H1045 Other chronic allergic conjunctivitis: Secondary | ICD-10-CM | POA: Diagnosis not present

## 2023-01-04 DIAGNOSIS — H43393 Other vitreous opacities, bilateral: Secondary | ICD-10-CM | POA: Diagnosis not present

## 2023-01-04 DIAGNOSIS — H43813 Vitreous degeneration, bilateral: Secondary | ICD-10-CM | POA: Diagnosis not present

## 2023-02-21 DIAGNOSIS — W57XXXA Bitten or stung by nonvenomous insect and other nonvenomous arthropods, initial encounter: Secondary | ICD-10-CM | POA: Diagnosis not present

## 2023-02-21 DIAGNOSIS — S50862A Insect bite (nonvenomous) of left forearm, initial encounter: Secondary | ICD-10-CM | POA: Diagnosis not present

## 2023-04-26 DIAGNOSIS — Z6837 Body mass index (BMI) 37.0-37.9, adult: Secondary | ICD-10-CM | POA: Diagnosis not present

## 2023-04-26 DIAGNOSIS — D071 Carcinoma in situ of vulva: Secondary | ICD-10-CM | POA: Diagnosis not present

## 2023-04-26 DIAGNOSIS — Z124 Encounter for screening for malignant neoplasm of cervix: Secondary | ICD-10-CM | POA: Diagnosis not present

## 2023-04-26 DIAGNOSIS — Z1272 Encounter for screening for malignant neoplasm of vagina: Secondary | ICD-10-CM | POA: Diagnosis not present

## 2023-04-26 DIAGNOSIS — Z1231 Encounter for screening mammogram for malignant neoplasm of breast: Secondary | ICD-10-CM | POA: Diagnosis not present

## 2023-04-26 DIAGNOSIS — N952 Postmenopausal atrophic vaginitis: Secondary | ICD-10-CM | POA: Diagnosis not present

## 2023-05-30 DIAGNOSIS — N958 Other specified menopausal and perimenopausal disorders: Secondary | ICD-10-CM | POA: Diagnosis not present

## 2023-05-30 DIAGNOSIS — M8588 Other specified disorders of bone density and structure, other site: Secondary | ICD-10-CM | POA: Diagnosis not present

## 2023-06-28 DIAGNOSIS — E781 Pure hyperglyceridemia: Secondary | ICD-10-CM | POA: Diagnosis not present

## 2023-06-28 DIAGNOSIS — Z0000 Encounter for general adult medical examination without abnormal findings: Secondary | ICD-10-CM | POA: Diagnosis not present

## 2023-06-28 DIAGNOSIS — Z6836 Body mass index (BMI) 36.0-36.9, adult: Secondary | ICD-10-CM | POA: Diagnosis not present

## 2023-06-28 DIAGNOSIS — M810 Age-related osteoporosis without current pathological fracture: Secondary | ICD-10-CM | POA: Diagnosis not present

## 2023-06-28 DIAGNOSIS — R739 Hyperglycemia, unspecified: Secondary | ICD-10-CM | POA: Diagnosis not present

## 2023-07-30 ENCOUNTER — Other Ambulatory Visit (HOSPITAL_COMMUNITY): Payer: Self-pay | Admitting: Internal Medicine

## 2023-07-30 DIAGNOSIS — Z86718 Personal history of other venous thrombosis and embolism: Secondary | ICD-10-CM | POA: Diagnosis not present

## 2023-07-30 DIAGNOSIS — M159 Polyosteoarthritis, unspecified: Secondary | ICD-10-CM | POA: Diagnosis not present

## 2023-07-30 DIAGNOSIS — H8110 Benign paroxysmal vertigo, unspecified ear: Secondary | ICD-10-CM | POA: Diagnosis not present

## 2023-07-30 DIAGNOSIS — M810 Age-related osteoporosis without current pathological fracture: Secondary | ICD-10-CM | POA: Diagnosis not present

## 2023-07-30 DIAGNOSIS — K625 Hemorrhage of anus and rectum: Secondary | ICD-10-CM | POA: Diagnosis not present

## 2023-07-30 DIAGNOSIS — Z0000 Encounter for general adult medical examination without abnormal findings: Secondary | ICD-10-CM | POA: Diagnosis not present

## 2023-07-30 DIAGNOSIS — Z8249 Family history of ischemic heart disease and other diseases of the circulatory system: Secondary | ICD-10-CM

## 2023-07-30 DIAGNOSIS — M79672 Pain in left foot: Secondary | ICD-10-CM | POA: Diagnosis not present

## 2023-07-30 DIAGNOSIS — Z23 Encounter for immunization: Secondary | ICD-10-CM | POA: Diagnosis not present

## 2023-08-30 DIAGNOSIS — R159 Full incontinence of feces: Secondary | ICD-10-CM | POA: Diagnosis not present

## 2023-08-30 DIAGNOSIS — Z800 Family history of malignant neoplasm of digestive organs: Secondary | ICD-10-CM | POA: Diagnosis not present

## 2023-08-30 DIAGNOSIS — K219 Gastro-esophageal reflux disease without esophagitis: Secondary | ICD-10-CM | POA: Diagnosis not present

## 2023-08-30 DIAGNOSIS — K921 Melena: Secondary | ICD-10-CM | POA: Diagnosis not present

## 2023-09-03 ENCOUNTER — Ambulatory Visit (HOSPITAL_COMMUNITY)
Admission: RE | Admit: 2023-09-03 | Discharge: 2023-09-03 | Disposition: A | Discharge status: Home / Self Care | Payer: Self-pay | Source: Ambulatory Visit | Attending: Internal Medicine | Admitting: Internal Medicine

## 2023-09-03 DIAGNOSIS — Z8249 Family history of ischemic heart disease and other diseases of the circulatory system: Secondary | ICD-10-CM | POA: Insufficient documentation

## 2023-09-05 DIAGNOSIS — L84 Corns and callosities: Secondary | ICD-10-CM | POA: Diagnosis not present

## 2023-09-05 DIAGNOSIS — M7742 Metatarsalgia, left foot: Secondary | ICD-10-CM | POA: Insufficient documentation

## 2023-09-05 DIAGNOSIS — M79672 Pain in left foot: Secondary | ICD-10-CM | POA: Diagnosis not present

## 2023-09-26 DIAGNOSIS — K5730 Diverticulosis of large intestine without perforation or abscess without bleeding: Secondary | ICD-10-CM | POA: Diagnosis not present

## 2023-09-26 DIAGNOSIS — R159 Full incontinence of feces: Secondary | ICD-10-CM | POA: Diagnosis not present

## 2023-09-26 DIAGNOSIS — Z800 Family history of malignant neoplasm of digestive organs: Secondary | ICD-10-CM | POA: Diagnosis not present

## 2023-09-26 DIAGNOSIS — K648 Other hemorrhoids: Secondary | ICD-10-CM | POA: Diagnosis not present

## 2023-09-26 DIAGNOSIS — K921 Melena: Secondary | ICD-10-CM | POA: Diagnosis not present

## 2023-09-26 DIAGNOSIS — D124 Benign neoplasm of descending colon: Secondary | ICD-10-CM | POA: Diagnosis not present

## 2023-09-26 DIAGNOSIS — K5289 Other specified noninfective gastroenteritis and colitis: Secondary | ICD-10-CM | POA: Diagnosis not present

## 2023-09-28 DIAGNOSIS — K5289 Other specified noninfective gastroenteritis and colitis: Secondary | ICD-10-CM | POA: Diagnosis not present

## 2023-09-28 DIAGNOSIS — D124 Benign neoplasm of descending colon: Secondary | ICD-10-CM | POA: Diagnosis not present

## 2023-10-03 DIAGNOSIS — I77810 Thoracic aortic ectasia: Secondary | ICD-10-CM | POA: Diagnosis not present

## 2023-10-03 DIAGNOSIS — I2510 Atherosclerotic heart disease of native coronary artery without angina pectoris: Secondary | ICD-10-CM | POA: Diagnosis not present

## 2023-10-09 ENCOUNTER — Ambulatory Visit: Admitting: Podiatry

## 2023-10-23 ENCOUNTER — Encounter: Payer: Self-pay | Admitting: Podiatry

## 2023-10-23 ENCOUNTER — Ambulatory Visit (INDEPENDENT_AMBULATORY_CARE_PROVIDER_SITE_OTHER): Admitting: Podiatry

## 2023-10-23 DIAGNOSIS — R141 Gas pain: Secondary | ICD-10-CM | POA: Insufficient documentation

## 2023-10-23 DIAGNOSIS — D2372 Other benign neoplasm of skin of left lower limb, including hip: Secondary | ICD-10-CM

## 2023-10-23 DIAGNOSIS — K921 Melena: Secondary | ICD-10-CM | POA: Insufficient documentation

## 2023-10-23 DIAGNOSIS — R1013 Epigastric pain: Secondary | ICD-10-CM | POA: Insufficient documentation

## 2023-10-24 NOTE — Progress Notes (Signed)
 She presents today about a painful lesion plantar aspect of the forefoot left.  States that has been trimmed on a couple of occasions by Dr. Celia Coles but it just keeps coming back wonders if there is anything more permanent that can be done.  She states that she saw Dr. Rosebud Confer who just trimmed it and recommended surgery.  Objective: Vital signs are stable alert and oriented x 3 pulses are palpable.  Mild hammertoe deformities flexible in nature left foot resulting in plantarflexed third metatarsal and benign skin lesion beneath the third metatarsal head.  This is a deep porokeratotic lesion does not demonstrate signs of infection nor of skin breakdown.  Assessment: Plantarflexed third metatarsal with benign skin lesion plantarly.  Plan: Discussed etiology pathology conservative surgical therapies debrided the lesion today placed Salinocaine under occlusion to be left on for 3 days before being washed off thoroughly.  Discussed the possible need for shortening osteotomy of the metatarsal she understands and is amenable to it if necessary.  Will follow-up with us  on an as-needed basis.

## 2023-11-14 DIAGNOSIS — I2510 Atherosclerotic heart disease of native coronary artery without angina pectoris: Secondary | ICD-10-CM | POA: Diagnosis not present

## 2023-12-24 DIAGNOSIS — Z471 Aftercare following joint replacement surgery: Secondary | ICD-10-CM | POA: Diagnosis not present

## 2023-12-24 DIAGNOSIS — Z96653 Presence of artificial knee joint, bilateral: Secondary | ICD-10-CM | POA: Diagnosis not present

## 2024-01-23 DIAGNOSIS — H43813 Vitreous degeneration, bilateral: Secondary | ICD-10-CM | POA: Diagnosis not present

## 2024-01-23 DIAGNOSIS — H1045 Other chronic allergic conjunctivitis: Secondary | ICD-10-CM | POA: Diagnosis not present

## 2024-01-23 DIAGNOSIS — H43393 Other vitreous opacities, bilateral: Secondary | ICD-10-CM | POA: Diagnosis not present

## 2024-01-23 DIAGNOSIS — H353131 Nonexudative age-related macular degeneration, bilateral, early dry stage: Secondary | ICD-10-CM | POA: Diagnosis not present

## 2024-02-07 DIAGNOSIS — Z96653 Presence of artificial knee joint, bilateral: Secondary | ICD-10-CM | POA: Diagnosis not present

## 2024-02-26 DIAGNOSIS — L820 Inflamed seborrheic keratosis: Secondary | ICD-10-CM | POA: Diagnosis not present

## 2024-02-26 DIAGNOSIS — L821 Other seborrheic keratosis: Secondary | ICD-10-CM | POA: Diagnosis not present

## 2024-02-26 DIAGNOSIS — D485 Neoplasm of uncertain behavior of skin: Secondary | ICD-10-CM | POA: Diagnosis not present

## 2024-02-26 DIAGNOSIS — L918 Other hypertrophic disorders of the skin: Secondary | ICD-10-CM | POA: Diagnosis not present

## 2024-04-29 DIAGNOSIS — Z124 Encounter for screening for malignant neoplasm of cervix: Secondary | ICD-10-CM | POA: Diagnosis not present

## 2024-04-29 DIAGNOSIS — Z1231 Encounter for screening mammogram for malignant neoplasm of breast: Secondary | ICD-10-CM | POA: Diagnosis not present

## 2024-04-29 DIAGNOSIS — Z1272 Encounter for screening for malignant neoplasm of vagina: Secondary | ICD-10-CM | POA: Diagnosis not present

## 2024-04-29 DIAGNOSIS — Z6838 Body mass index (BMI) 38.0-38.9, adult: Secondary | ICD-10-CM | POA: Diagnosis not present

## 2024-04-29 DIAGNOSIS — N952 Postmenopausal atrophic vaginitis: Secondary | ICD-10-CM | POA: Diagnosis not present

## 2024-05-05 ENCOUNTER — Other Ambulatory Visit: Payer: Self-pay | Admitting: Obstetrics and Gynecology

## 2024-05-05 DIAGNOSIS — R928 Other abnormal and inconclusive findings on diagnostic imaging of breast: Secondary | ICD-10-CM

## 2024-05-21 ENCOUNTER — Ambulatory Visit
Admission: RE | Admit: 2024-05-21 | Discharge: 2024-05-21 | Disposition: A | Source: Ambulatory Visit | Attending: Obstetrics and Gynecology | Admitting: Obstetrics and Gynecology

## 2024-05-21 ENCOUNTER — Other Ambulatory Visit: Payer: Self-pay | Admitting: Obstetrics and Gynecology

## 2024-05-21 DIAGNOSIS — R928 Other abnormal and inconclusive findings on diagnostic imaging of breast: Secondary | ICD-10-CM | POA: Diagnosis not present

## 2024-05-21 DIAGNOSIS — N6314 Unspecified lump in the right breast, lower inner quadrant: Secondary | ICD-10-CM | POA: Diagnosis not present

## 2024-11-20 ENCOUNTER — Other Ambulatory Visit

## 2024-11-20 ENCOUNTER — Encounter
